# Patient Record
Sex: Male | Born: 1970 | Race: Black or African American | Hispanic: No | Marital: Single | Smoking: Never smoker
Health system: Southern US, Community
[De-identification: ages and names within clinical notes are randomized; demographics above are authoritative.]

## PROBLEM LIST (undated history)

## (undated) DIAGNOSIS — M1990 Unspecified osteoarthritis, unspecified site: Secondary | ICD-10-CM

## (undated) DIAGNOSIS — I10 Essential (primary) hypertension: Secondary | ICD-10-CM

## (undated) DIAGNOSIS — F419 Anxiety disorder, unspecified: Secondary | ICD-10-CM

## (undated) DIAGNOSIS — H409 Unspecified glaucoma: Secondary | ICD-10-CM

## (undated) DIAGNOSIS — N529 Male erectile dysfunction, unspecified: Secondary | ICD-10-CM

## (undated) DIAGNOSIS — T7840XA Allergy, unspecified, initial encounter: Secondary | ICD-10-CM

## (undated) DIAGNOSIS — F325 Major depressive disorder, single episode, in full remission: Secondary | ICD-10-CM

## (undated) HISTORY — DX: Allergy, unspecified, initial encounter: T78.40XA

## (undated) HISTORY — PX: CIRCUMCISION: SUR203

## (undated) HISTORY — DX: Unspecified glaucoma: H40.9

## (undated) HISTORY — DX: Morbid (severe) obesity due to excess calories: E66.01

## (undated) HISTORY — DX: Essential (primary) hypertension: I10

## (undated) HISTORY — DX: Male erectile dysfunction, unspecified: N52.9

## (undated) HISTORY — DX: Anxiety disorder, unspecified: F41.9

## (undated) HISTORY — DX: Unspecified osteoarthritis, unspecified site: M19.90

## (undated) HISTORY — PX: HERNIA REPAIR: SHX51

## (undated) HISTORY — DX: Major depressive disorder, single episode, in full remission: F32.5

---

## 1999-08-16 HISTORY — PX: APPENDECTOMY: SHX54

## 2008-12-18 ENCOUNTER — Ambulatory Visit: Payer: Self-pay | Admitting: Family Medicine

## 2008-12-18 DIAGNOSIS — I10 Essential (primary) hypertension: Secondary | ICD-10-CM

## 2008-12-31 ENCOUNTER — Ambulatory Visit: Payer: Self-pay | Admitting: Family Medicine

## 2009-01-01 LAB — CONVERTED CEMR LAB
ALT: 18 units/L (ref 0–53)
AST: 24 units/L (ref 0–37)
CO2: 26 meq/L (ref 19–32)
Calcium: 9.7 mg/dL (ref 8.4–10.5)
Chloride: 102 meq/L (ref 96–112)
Cholesterol: 184 mg/dL (ref 0–200)
Creatinine, Ser: 1.37 mg/dL (ref 0.40–1.50)
Sodium: 139 meq/L (ref 135–145)
Testosterone: 479.56 ng/dL (ref 350–890)
Total Bilirubin: 0.6 mg/dL (ref 0.3–1.2)
Total CHOL/HDL Ratio: 3.9
Total Protein: 7.6 g/dL (ref 6.0–8.3)
VLDL: 23 mg/dL (ref 0–40)

## 2009-03-17 ENCOUNTER — Encounter: Payer: Self-pay | Admitting: Family Medicine

## 2010-03-24 ENCOUNTER — Ambulatory Visit: Payer: Self-pay | Admitting: Family Medicine

## 2010-06-14 ENCOUNTER — Ambulatory Visit: Payer: Self-pay | Admitting: Family Medicine

## 2010-06-15 LAB — CONVERTED CEMR LAB
ALT: 19 units/L (ref 0–53)
AST: 25 units/L (ref 0–37)
Albumin: 4.4 g/dL (ref 3.5–5.2)
Alkaline Phosphatase: 58 units/L (ref 39–117)
Glucose, Bld: 88 mg/dL (ref 70–99)
LDL Cholesterol: 86 mg/dL (ref 0–99)
Potassium: 4.1 meq/L (ref 3.5–5.3)
Sodium: 139 meq/L (ref 135–145)
Total Bilirubin: 0.5 mg/dL (ref 0.3–1.2)
Total Protein: 7.4 g/dL (ref 6.0–8.3)
VLDL: 23 mg/dL (ref 0–40)

## 2010-09-14 NOTE — Assessment & Plan Note (Signed)
Summary: f/u BP   Vital Signs:  Patient profile:   40 year old male Height:      72.6 inches Weight:      243 pounds BMI:     32.53 O2 Sat:      100 % on Room air Pulse rate:   63 / minute BP sitting:   124 / 82  (left arm) Cuff size:   large  Vitals Entered By: Payton Spark CMA (June 14, 2010 8:25 AM)  O2 Flow:  Room air CC: F/U. Fasting labs., Hypertension Management   CC:  F/U. Fasting labs. and Hypertension Management.  Hypertension History:      He denies headache, chest pain, palpitations, dyspnea with exertion, peripheral edema, visual symptoms, and side effects from treatment.  He notes no problems with any antihypertensive medication side effects.        Positive major cardiovascular risk factors include hypertension.  Negative major cardiovascular risk factors include male age less than 41 years old, no history of diabetes or hyperlipidemia, negative family history for ischemic heart disease, and non-tobacco-user status.        Further assessment for target organ damage reveals no history of ASHD, cardiac end-organ damage (CHF/LVH), stroke/TIA, peripheral vascular disease, renal insufficiency, or hypertensive retinopathy.     Habits & Providers  Alcohol-Tobacco-Diet     Tobacco Status: never  Current Medications (verified): 1)  Viagra 50 Mg Tabs (Sildenafil Citrate) .Marland Kitchen.. 1 By Mouth As Needed 2)  Benicar Hct 20-12.5 Mg Tabs (Olmesartan Medoxomil-Hctz) .Marland Kitchen.. 1 Tab By Mouth Daily  Allergies (verified): No Known Drug Allergies  Social History: Smoking Status:  never  Review of Systems      See HPI  Physical Exam  General:  alert, well-developed, well-nourished, and well-hydrated.   Head:  normocephalic and atraumatic.   Eyes:  pupils equal, pupils round, and pupils reactive to light.   Mouth:  good dentition and pharynx pink and moist.   Neck:  no masses.   Lungs:  Normal respiratory effort, chest expands symmetrically. Lungs are clear to auscultation,  no crackles or wheezes. Heart:  Normal rate and regular rhythm. S1 and S2 normal without gallop, murmur, click, rub or other extra sounds. Extremities:  no LE edema Skin:  color normal.   Psych:  good eye contact, not anxious appearing, and not depressed appearing.     Impression & Recommendations:  Problem # 1:  ESSENTIAL HYPERTENSION, BENIGN (ICD-401.1) BP looks good but Benicar costing more than he'd like.  Will change to Losartan/ HCTZ daily.  Update fasting labs today. Schedule CPE in 6 mos. His updated medication list for this problem includes:    Losartan Potassium-hctz 100-12.5 Mg Tabs (Losartan potassium-hctz) .Marland Kitchen... 1 tab by mouth q am  Orders: T-Comprehensive Metabolic Panel ([id number])  BP today: 124/82 Prior BP: 114/72 (03/24/2010)  10 Yr Risk Heart Disease: 4 %  Labs Reviewed: K+: 4.5 (12/31/2008) Creat: : 1.37 (12/31/2008)   Chol: 184 (12/31/2008)   HDL: 47 (12/31/2008)   LDL: 114 (12/31/2008)   TG: 113 (12/31/2008)  Complete Medication List: 1)  Viagra 50 Mg Tabs (Sildenafil citrate) .Marland Kitchen.. 1 by mouth as needed 2)  Losartan Potassium-hctz 100-12.5 Mg Tabs (Losartan potassium-hctz) .Marland Kitchen.. 1 tab by mouth q am  Other Orders: T-Lipid Profile ([id number])  Hypertension Assessment/Plan:      The patient's hypertensive risk group is category A: No risk factors and no target organ damage.  His calculated 10 year risk of coronary heart disease is 4 %.  Today's blood pressure is 124/82.  His blood pressure goal is < 140/90.  Patient Instructions: 1)  Labs today. 2)  Will call you w/ results tomorrow. 3)  Change Benicar/ HCTZ to Losartan / HCTZ for BP. 4)  Return for a PHYSICAL in 6 mos. Prescriptions: LOSARTAN POTASSIUM-HCTZ 100-12.5 MG TABS (LOSARTAN POTASSIUM-HCTZ) 1 tab by mouth q AM  #30 x 6   Entered and Authorized by:   Seymour Bars DO   Signed by:   Seymour Bars DO on 06/14/2010   Method used:   [facility] to        [facility] [phone]* (retail)        [address]       [place], [place]  [address]       Ph: [phone] or [phone]       Fax: [phone]   RxID:   [id number]    Orders Added: 1)  T-Lipid Profile [phone] 2)  T-Comprehensive Metabolic Panel [[id number]] 3)  Est. Patient Level III [60109]

## 2010-09-14 NOTE — Assessment & Plan Note (Signed)
Summary: cough   Vital Signs:  Patient profile:   40 year old male Height:      72.6 inches Weight:      238 pounds BMI:     31.86 O2 Sat:      99 % on Room air Temp:     98.7 degrees F oral Pulse rate:   86 / minute BP sitting:   114 / 72  (left arm) Cuff size:   large  Vitals Entered By: Payton Spark CMA (March 24, 2010 10:51 AM)  O2 Flow:  Room air CC: Cold Sxs 1 month ago all Sxs except for dry cough have resolved.    Primary Care Provider:  Seymour Bars DO  CC:  Cold Sxs 1 month ago all Sxs except for dry cough have resolved. Marland Kitchen  History of Present Illness: 40 yo AAM presents for a dry cough x 1 month following a URI.  Denies fevers or chills.  Has had a sore throat every AM.  Has a tickle in his throat.  Has alllergies but is not taking anything.  The cough is not keeping him up at night.  He is taking Delsym at night.  Denies chest tightness or SOB.  He is not a smoker and has no hx of asthma.  The cough is non productive.    Denies symptoms of acid reflux. He is not on an ACEi.  Current Medications (verified): 1)  Viagra 50 Mg Tabs (Sildenafil Citrate) .Marland Kitchen.. 1 By Mouth As Needed 2)  Benicar Hct 20-12.5 Mg Tabs (Olmesartan Medoxomil-Hctz) .Marland Kitchen.. 1 Tab By Mouth Daily  Allergies (verified): No Known Drug Allergies  Review of Systems      See HPI  Physical Exam  General:  alert, well-developed, well-nourished, and well-hydrated.   Head:  normocephalic and atraumatic.  sinuses NTTP Eyes:  conjunctiva clear Ears:  EACs patent; TMs translucent and gray with good cone of light and bony landmarks.  Nose:  boggy turbinates with scant rhinorrhea Mouth:  postnasal drip present Neck:  no masses.   Lungs:  Normal respiratory effort, chest expands symmetrically. Lungs are clear to auscultation, no crackles or wheezes. Heart:  Normal rate and regular rhythm. S1 and S2 normal without gallop, murmur, click, rub or other extra sounds. Skin:  color normal.   Cervical Nodes:   No lymphadenopathy noted   Impression & Recommendations:  Problem # 1:  BRONCHITIS, VIRAL, ACUTE (ICD-466.0) Post viral URI cough.  Treat with Prednisone 40 mg/ day x 5 days + use of Delsym as needed. Call if cough has not resolved in 1 wk.  Complete Medication List: 1)  Viagra 50 Mg Tabs (Sildenafil citrate) .Marland Kitchen.. 1 by mouth as needed 2)  Benicar Hct 20-12.5 Mg Tabs (Olmesartan medoxomil-hctz) .Marland Kitchen.. 1 tab by mouth daily 3)  Prednisone 20 Mg Tabs (Prednisone) .... 2 tabs by mouth once daily x 5 days  Patient Instructions: 1)  Take Prednisone 40 mg once daily for the next 5 days for post viral cough. 2)  Stay on zyrtec OTC each night for allergies for the next 6 wks. 3)  Return for a PHYSICAL with fasting labs in 2 mos. Prescriptions: PREDNISONE 20 MG TABS (PREDNISONE) 2 tabs by mouth once daily x 5 days  #10 x 0   Entered and Authorized by:   Seymour Bars DO   Signed by:   Seymour Bars DO on 03/24/2010   Method used:   [facility]        [facility]  [address] [phone]* (retail)       [address] [place], [place]  [address]       Ph: [phone] or [phone]       Fax: [phone]   RxID:   [id number]

## 2010-11-18 ENCOUNTER — Other Ambulatory Visit: Payer: Self-pay | Admitting: Family Medicine

## 2010-11-29 ENCOUNTER — Other Ambulatory Visit: Payer: Self-pay | Admitting: *Deleted

## 2010-11-29 MED ORDER — SILDENAFIL CITRATE 50 MG PO TABS: 50.0000 mg | ORAL_TABLET | ORAL | Status: DC | PRN

## 2010-11-29 MED ORDER — LOSARTAN POTASSIUM-HCTZ 100-12.5 MG PO TABS: 1.0000 | ORAL_TABLET | Freq: Every day | ORAL | Status: DC

## 2010-12-14 ENCOUNTER — Telehealth: Payer: Self-pay | Admitting: Family Medicine

## 2010-12-14 NOTE — Telephone Encounter (Signed)
Pt called and said moving to [facility] and he was suppose to receive 90 day refills and according to system it was sent as 90 day refills.  Only showing 2 medications on file and they were sent.  LMOM for pt stating sent as 90 day and not 30 day. Jarvis Newcomer, LPN [facility]

## 2011-03-23 ENCOUNTER — Emergency Department (HOSPITAL_COMMUNITY): Payer: Self-pay

## 2011-03-23 ENCOUNTER — Emergency Department (HOSPITAL_COMMUNITY)
Admission: EM | Admit: 2011-03-23 | Discharge: 2011-03-23 | Disposition: A | Discharge status: Home / Self Care | Payer: Self-pay | Attending: Emergency Medicine | Admitting: Emergency Medicine

## 2011-03-23 DIAGNOSIS — I10 Essential (primary) hypertension: Secondary | ICD-10-CM | POA: Insufficient documentation

## 2011-03-23 DIAGNOSIS — A879 Viral meningitis, unspecified: Secondary | ICD-10-CM | POA: Insufficient documentation

## 2011-03-23 DIAGNOSIS — R509 Fever, unspecified: Secondary | ICD-10-CM | POA: Insufficient documentation

## 2011-03-23 DIAGNOSIS — R51 Headache: Secondary | ICD-10-CM | POA: Insufficient documentation

## 2011-03-23 DIAGNOSIS — M2569 Stiffness of other specified joint, not elsewhere classified: Secondary | ICD-10-CM | POA: Insufficient documentation

## 2011-03-23 DIAGNOSIS — Z79899 Other long term (current) drug therapy: Secondary | ICD-10-CM | POA: Insufficient documentation

## 2011-03-23 DIAGNOSIS — R291 Meningismus: Secondary | ICD-10-CM | POA: Insufficient documentation

## 2011-03-23 LAB — DIFFERENTIAL
Lymphs Abs: 1.9 10*3/uL (ref 0.7–4.0)
Monocytes Absolute: 0.5 10*3/uL (ref 0.1–1.0)
Monocytes Relative: 4 % (ref 3–12)
Neutro Abs: 9.9 10*3/uL — ABNORMAL HIGH (ref 1.7–7.7)
Neutrophils Relative %: 80 % — ABNORMAL HIGH (ref 43–77)

## 2011-03-23 LAB — CBC
Hemoglobin: 16.1 g/dL (ref 13.0–17.0)
MCH: 31.2 pg (ref 26.0–34.0)
MCV: 86.4 fL (ref 78.0–100.0)
RBC: 5.16 MIL/uL (ref 4.22–5.81)

## 2011-03-23 LAB — URINALYSIS, ROUTINE W REFLEX MICROSCOPIC
Bilirubin Urine: NEGATIVE
Leukocytes, UA: NEGATIVE
Nitrite: NEGATIVE
Specific Gravity, Urine: 1.02 (ref 1.005–1.030)
Urobilinogen, UA: 0.2 mg/dL (ref 0.0–1.0)
pH: 6 (ref 5.0–8.0)

## 2011-03-23 LAB — CSF CELL COUNT WITH DIFFERENTIAL
Lymphs, CSF: 74 % (ref 40–80)
Monocyte-Macrophage-Spinal Fluid: 20 % (ref 15–45)
RBC Count, CSF: 173 /mm3 — ABNORMAL HIGH
Segmented Neutrophils-CSF: 6 % (ref 0–6)
WBC, CSF: 94 /mm3 (ref 0–5)

## 2011-03-23 LAB — BASIC METABOLIC PANEL
BUN: 19 mg/dL (ref 6–23)
CO2: 27 mEq/L (ref 19–32)
Calcium: 9.7 mg/dL (ref 8.4–10.5)
Creatinine, Ser: 1.26 mg/dL (ref 0.50–1.35)
Glucose, Bld: 99 mg/dL (ref 70–99)

## 2011-03-23 LAB — GRAM STAIN

## 2011-03-27 LAB — CSF CULTURE W GRAM STAIN: Culture: NO GROWTH

## 2011-03-29 LAB — CULTURE, BLOOD (ROUTINE X 2): Culture  Setup Time: 201208082224

## 2011-06-29 ENCOUNTER — Other Ambulatory Visit: Payer: Self-pay | Admitting: *Deleted

## 2011-06-29 MED ORDER — LOSARTAN POTASSIUM-HCTZ 100-12.5 MG PO TABS: 1.0000 | ORAL_TABLET | Freq: Every day | ORAL | Status: DC

## 2011-07-18 ENCOUNTER — Encounter: Payer: Self-pay | Admitting: Family Medicine

## 2011-07-19 ENCOUNTER — Encounter: Payer: Self-pay | Admitting: Family Medicine

## 2011-07-19 ENCOUNTER — Ambulatory Visit (INDEPENDENT_AMBULATORY_CARE_PROVIDER_SITE_OTHER): Payer: 59 | Admitting: Family Medicine

## 2011-07-19 DIAGNOSIS — A879 Viral meningitis, unspecified: Secondary | ICD-10-CM

## 2011-07-19 DIAGNOSIS — Z9229 Personal history of other drug therapy: Secondary | ICD-10-CM

## 2011-07-19 DIAGNOSIS — N529 Male erectile dysfunction, unspecified: Secondary | ICD-10-CM

## 2011-07-19 DIAGNOSIS — Z0000 Encounter for general adult medical examination without abnormal findings: Secondary | ICD-10-CM

## 2011-07-19 DIAGNOSIS — I10 Essential (primary) hypertension: Secondary | ICD-10-CM

## 2011-07-19 MED ORDER — TADALAFIL 20 MG PO TABS: 20.0000 mg | ORAL_TABLET | Freq: Every day | ORAL | Status: DC | PRN

## 2011-07-19 MED ORDER — IRBESARTAN-HYDROCHLOROTHIAZIDE 150-12.5 MG PO TABS: 1.0000 | ORAL_TABLET | Freq: Every day | ORAL | Status: DC

## 2011-07-19 NOTE — Progress Notes (Signed)
  Subjective:    Patient ID: Albert Hogan, male    DOB: Nov 07, 1970, 40 y.o.   MRN: [id number]  Hypertension    #1Patient's here for followup of hypertension. He reports being out of his blood pressure medicine Hyzaar for about a week to 2 weeks. 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   BP 152/97  Pulse 88  Ht 6\' 1"  (1.854 m)  Wt 243 lb (110.224 kg)  BMI 32.06 kg/m2  SpO2 98%      Objective:   Physical Exam  Constitutional: He is oriented to person, place, and time. He appears well-developed and well-nourished.  HENT:  Head: Normocephalic and atraumatic.  Neck: Normal range of motion. Neck supple. No tracheal deviation present. No thyromegaly present.  Cardiovascular: Normal rate, regular rhythm and normal heart sounds.   Pulmonary/Chest: Effort normal. No respiratory distress. He has no wheezes.  Neurological: He is alert and oriented to person, place, and time. No cranial nerve deficit. Coordination normal.  Skin: Skin is warm and dry.  Psychiatric: He has a normal mood and affect. His behavior is normal.       Assessment & Plan:  #1 hypertension since she has been on Benicar before and he states the Diovan causing her headache we'll try him on Avalide 150/12.5 to see if this makes a difference as blood pressure #2 social dysfunction. Viagra, Cialis 20 mg #10 one by mouth before sex relations talk about the pros and cons of Cialis versus Viagra also offered him the daily Cialis but that does not appear to be a good match at this time Will check testosterone level as well.. #3 history of second episode of viral meningitis he is recovered but because of his history of 2 episodes about meningitis we HIV and CBC to evaluate immune system  #4 immunization update also carried out  and patient has had his immunization updated

## 2011-07-19 NOTE — Patient Instructions (Signed)
Immune System The immune system is the body's natural defense system. It consists of organs and highly specialized cells, all working together to clear infection from the body. To accomplish this, the immune system relies on natural barriers, such as skin and mucus, to help block infection. It also relies on white blood cells and antibodies. Antibodies (also called immunoglobulin, or Ig) are large, Y-shaped proteins used to identify and remove germs, such as bacteria and viruses. HOW THE IMMUNE SYSTEM WORKS White blood cells travel throughout the body and are made in the bone marrow. Bone marrow is found in the center shafts of certain long, flat bones of the body. There are many types of white blood cells with different roles in fighting infection. These cells are grouped into 3 categories: lymphocytes, granulocytes, and monocytes/macrophages. Lymphocytes The 2 major classes of lymphocytes are B cells and T cells.  B cells produce antibodies that circulate in the blood and lymph streams. These antibodies attach to foreign germs to mark them for destruction by other immune cells. The types of antibodies made by B cells are:   IgG, the main infection-fighting antibody in the bloodstream.   IgA, which helps protect the gut and respiratory tract.   IgM, made at the early signs of infection.   IgD, the purpose of which is unknown.   IgE, which helps fight against parasites, but can also be elevated with allergies and other conditions.   T cells are responsible for cellular immunity. T cells tell other white blood cells to prepare to fight. T cells provide support to other cells to help them fight and kill infected cells in the body. There are 2 main classes of T cells:   Helper T cells (CD4-positive T cells). They alert B cells to start making antibodies. They activate other T cells and macrophages. They influence which type of antibody is produced.   Cytotoxic T cells (CD8-positive T cells). They  become killer cells that attack and destroy infected cells.  Granulocytes  These cells are attracted to sites of inflammation, injury, or infection.   They release chemicals to kill germs or clean up wounds.   They are the "front-line" fighters against infection.   They have a short life span in the bloodstream. New ones are constantly being made.  Monocytes/Macrophages Monocytes travel in the blood and mature to become macrophages that travel in the tissues. These cells are very important in alerting the immune system about an infection. Macrophages are scavengers whose job is to engulf or eat up infecting germs and even infected cells. IMMUNE SYSTEM PROBLEMS People who have problems with the number of white cells being made and how well they do their jobs can be more prone to certain germs. This can be the cause of a decreased ability to fight infection (immunodeficiency). Problems in the immune system range from mild to severe. Your caregiver can help you determine if your immune system is working properly. Document Released: 08/04/2003 Document Revised: 04/13/2011 Document Reviewed: 12/07/2009 [facility] Patient Information 2012 [place], [place].Hypertension As your heart beats, it forces blood through your arteries. This force is your blood pressure. If the pressure is too high, it is called hypertension (HTN) or high blood pressure. HTN is dangerous because you may have it and not know it. High blood pressure may mean that your heart has to work harder to pump blood. Your arteries may be narrow or stiff. The extra work puts you at risk for heart disease, stroke, and other problems.  Blood pressure consists of two numbers, a higher number over a lower, 110/72, for example. It is stated as "110 over 72." The ideal is below 120 for the top number (systolic) and under 80 for the bottom (diastolic). Write down your blood pressure today. You should pay close attention to your blood pressure if you  have certain conditions such as:  Heart failure.   Prior heart attack.   Diabetes   Chronic kidney disease.   Prior stroke.   Multiple risk factors for heart disease.  To see if you have HTN, your blood pressure should be measured while you are seated with your arm held at the level of the heart. It should be measured at least twice. A one-time elevated blood pressure reading (especially in the Emergency Department) does not mean that you need treatment. There may be conditions in which the blood pressure is different between your right and left arms. It is important to see your caregiver soon for a recheck. Most people have essential hypertension which means that there is not a specific cause. This type of high blood pressure may be lowered by changing lifestyle factors such as:  Stress.   Smoking.   Lack of exercise.   Excessive weight.   Drug/tobacco/alcohol use.   Eating less salt.  Most people do not have symptoms from high blood pressure until it has caused damage to the body. Effective treatment can often prevent, delay or reduce that damage. TREATMENT  When a cause has been identified, treatment for high blood pressure is directed at the cause. There are a large number of medications to treat HTN. These fall into several categories, and your caregiver will help you select the medicines that are best for you. Medications may have side effects. You should review side effects with your caregiver. If your blood pressure stays high after you have made lifestyle changes or started on medicines,   Your medication(s) may need to be changed.   Other problems may need to be addressed.   Be certain you understand your prescriptions, and know how and when to take your medicine.   Be sure to follow up with your caregiver within the time frame advised (usually within two weeks) to have your blood pressure rechecked and to review your medications.   If you are taking more than one  medicine to lower your blood pressure, make sure you know how and at what times they should be taken. Taking two medicines at the same time can result in blood pressure that is too low.  SEEK IMMEDIATE MEDICAL CARE IF:  You develop a severe headache, blurred or changing vision, or confusion.   You have unusual weakness or numbness, or a faint feeling.   You have severe chest or abdominal pain, vomiting, or breathing problems.  MAKE SURE YOU:   Understand these instructions.   Will watch your condition.   Will get help right away if you are not doing well or get worse.  Document Released: 08/01/2005 Document Revised: 04/13/2011 Document Reviewed: 03/21/2008 [facility] Patient Information 2012 [place], [place].

## 2011-07-20 LAB — BASIC METABOLIC PANEL WITH GFR
CO2: 28 mEq/L (ref 19–32)
Chloride: 102 mEq/L (ref 96–112)
Creat: 1.26 mg/dL (ref 0.50–1.35)
Sodium: 139 mEq/L (ref 135–145)

## 2011-07-20 LAB — CBC WITH DIFFERENTIAL/PLATELET
Eosinophils Relative: 3 % (ref 0–5)
Lymphocytes Relative: 31 % (ref 12–46)
Lymphs Abs: 2 10*3/uL (ref 0.7–4.0)
MCV: 87.5 fL (ref 78.0–100.0)
Platelets: 193 10*3/uL (ref 150–400)
RBC: 4.96 MIL/uL (ref 4.22–5.81)
WBC: 6.4 10*3/uL (ref 4.0–10.5)

## 2011-07-20 LAB — HIV ANTIBODY (ROUTINE TESTING W REFLEX): HIV: NONREACTIVE

## 2011-08-11 ENCOUNTER — Telehealth: Payer: Self-pay | Admitting: Family Medicine

## 2011-08-11 NOTE — Progress Notes (Signed)
Testosterone not one will discuss w/patient on return.

## 2011-08-15 NOTE — Telephone Encounter (Signed)
Testosterone  will discuss w/patient on return.

## 2011-08-19 ENCOUNTER — Ambulatory Visit (INDEPENDENT_AMBULATORY_CARE_PROVIDER_SITE_OTHER): Payer: 59 | Admitting: Physician Assistant

## 2011-08-19 ENCOUNTER — Encounter: Payer: Self-pay | Admitting: Physician Assistant

## 2011-08-19 VITALS — BP 136/89 | HR 83 | Temp 98.3°F | Wt 246.0 lb

## 2011-08-19 DIAGNOSIS — I10 Essential (primary) hypertension: Secondary | ICD-10-CM

## 2011-08-19 DIAGNOSIS — J069 Acute upper respiratory infection, unspecified: Secondary | ICD-10-CM

## 2011-08-19 MED ORDER — FLUTICASONE PROPIONATE 50 MCG/ACT NA SUSP: 2.0000 | Freq: Every day | NASAL | Status: DC

## 2011-08-19 NOTE — Progress Notes (Signed)
  Subjective:    Patient ID: Albert Hogan, male    DOB: [birth year], 41 y.o.   MRN: [id number]  URI  This is a new problem. The current episode started in the past 7 days. The problem has been gradually worsening. The maximum temperature recorded prior to his arrival was 101 - 101.9 F. Associated symptoms include sinus pain. Pertinent negatives include no abdominal pain, chest pain, diarrhea, joint pain, nausea, plugged ear sensation, rash, vomiting or wheezing. He has tried acetaminophen for the symptoms. The treatment provided mild relief.   Symptoms for 2 days. Denies SOB or wheezing.   Review of Systems  Respiratory: Negative for wheezing.   Cardiovascular: Negative for chest pain.  Gastrointestinal: Negative for nausea, vomiting, abdominal pain and diarrhea.  Musculoskeletal: Negative for joint pain.  Skin: Negative for rash.       Objective:   Physical Exam  Constitutional: He is oriented to person, place, and time. He appears well-developed and well-nourished. No distress.  HENT:  Head: Normocephalic and atraumatic.  Right Ear: External ear normal.  Left Ear: External ear normal.  Mouth/Throat: Oropharynx is clear and moist. No oropharyngeal exudate.       Bilateral turbinates swollen and erythematous.  Eyes: Right eye exhibits no discharge. Left eye exhibits no discharge.  Neck:       Left anterior cervical nodes enlarged without tenderness.  Cardiovascular: Normal rate, regular rhythm and normal heart sounds.   Pulmonary/Chest: Effort normal and breath sounds normal. No respiratory distress. He has no wheezes. He has no rales. He exhibits no tenderness.  Lymphadenopathy:    He has cervical adenopathy.  Neurological: He is alert and oriented to person, place, and time.  Skin: Skin is warm and dry. He is not diaphoretic.  Psychiatric: He has a normal mood and affect. His behavior is normal.          Assessment & Plan:   URI- Gave Flonase to use daily for nasal  congestion. Instructed to start regular mucinex BID, sinus rinses daily, tylenol for headache, Vitamin C daily, and honey for cough at night. If not better by next Friday call office and I will prescribe you an antibiotic.    Hypertension- Patient has not taken blood pressure medication this morning. Instructed to stay away from decongestants and use regular mucinex due to blood pressure issues.

## 2011-08-19 NOTE — Patient Instructions (Addendum)
Start Mucinex twice a day. Make sure you stay hydrated. Can use sinus rinses along with Vitamin C daily. Use tylenol as needed for aches and pain. Flonase to use daily for nasal congestion.Marland Kitchen Honey at night for cough. Call if not better by next Friday 7 days and we will call in antibiotic.

## 2011-10-27 ENCOUNTER — Other Ambulatory Visit: Payer: Self-pay | Admitting: *Deleted

## 2011-10-27 DIAGNOSIS — N529 Male erectile dysfunction, unspecified: Secondary | ICD-10-CM

## 2011-10-27 MED ORDER — TADALAFIL 20 MG PO TABS: 20.0000 mg | ORAL_TABLET | Freq: Every day | ORAL | Status: DC | PRN

## 2011-11-09 ENCOUNTER — Other Ambulatory Visit: Payer: Self-pay | Admitting: *Deleted

## 2011-11-09 DIAGNOSIS — I10 Essential (primary) hypertension: Secondary | ICD-10-CM

## 2011-11-09 MED ORDER — LOSARTAN POTASSIUM-HCTZ 100-12.5 MG PO TABS: 1.0000 | ORAL_TABLET | Freq: Every day | ORAL | Status: DC

## 2011-11-09 MED ORDER — IRBESARTAN-HYDROCHLOROTHIAZIDE 150-12.5 MG PO TABS: 1.0000 | ORAL_TABLET | Freq: Every day | ORAL | Status: DC

## 2011-11-15 ENCOUNTER — Encounter: Payer: Self-pay | Admitting: Family Medicine

## 2011-11-15 ENCOUNTER — Ambulatory Visit (INDEPENDENT_AMBULATORY_CARE_PROVIDER_SITE_OTHER): Payer: 59 | Admitting: Family Medicine

## 2011-11-15 VITALS — BP 132/90 | HR 81 | Ht 73.0 in | Wt 240.0 lb

## 2011-11-15 DIAGNOSIS — I10 Essential (primary) hypertension: Secondary | ICD-10-CM

## 2011-11-15 DIAGNOSIS — N529 Male erectile dysfunction, unspecified: Secondary | ICD-10-CM

## 2011-11-15 MED ORDER — IRBESARTAN 300 MG PO TABS: 300.0000 mg | ORAL_TABLET | Freq: Every day | ORAL | Status: DC

## 2011-11-15 MED ORDER — HYDROCHLOROTHIAZIDE 12.5 MG PO CAPS: 12.5000 mg | ORAL_CAPSULE | Freq: Every day | ORAL | Status: DC

## 2011-11-15 MED ORDER — TADALAFIL 20 MG PO TABS: 20.0000 mg | ORAL_TABLET | Freq: Every day | ORAL | Status: DC | PRN

## 2011-11-15 NOTE — Progress Notes (Signed)
  Subjective:    Patient ID: Albert Hogan, male    DOB: 12/11/1970, 41 y.o.   MRN: [id number]  Hypertension This is a recurrent problem. The current episode started more than 1 year ago. The problem has been gradually improving since onset. Pertinent negatives include no anxiety, blurred vision, chest pain, headaches, malaise/fatigue, neck pain, orthopnea, palpitations, peripheral edema, PND, shortness of breath or sweats. There are no associated agents to hypertension. Risk factors for coronary artery disease include no known risk factors. Past treatments include diuretics and angiotensin blockers. The current treatment provides significant improvement. There are no compliance problems.  Hypertensive end-organ damage includes retinopathy. There is no history of angina, kidney disease, CAD/MI, CVA, heart failure or left ventricular hypertrophy.   Patient is well pleased with Avapro and HCTZ. Unfortunately unable to get both medications as one agent.   Review of Systems  Constitutional: Negative for malaise/fatigue.  HENT: Negative for neck pain.   Eyes: Negative for blurred vision.  Respiratory: Negative for shortness of breath.   Cardiovascular: Negative for chest pain, palpitations, orthopnea and PND.  Neurological: Negative for headaches.  All other systems reviewed and are negative.      BP 132/90  Pulse 81  Ht 6\' 1"  (1.854 m)  Wt 240 lb (108.863 kg)  BMI 31.66 kg/m2  SpO2 100% Objective:   Physical Exam  Constitutional: He is oriented to person, place, and time. He appears well-developed and well-nourished.  HENT:  Head: Normocephalic and atraumatic.  Eyes: Pupils are equal, round, and reactive to light.  Neck: Normal range of motion. Neck supple.  Cardiovascular: Normal rate and regular rhythm.   Pulmonary/Chest: Effort normal and breath sounds normal.  Neurological: He is alert and oriented to person, place, and time.  Skin: Skin is warm and dry.  Psychiatric: He has a  normal mood and affect. His behavior is normal.          Assessment & Plan:  #1 hypertension. Blood pressure is better control but not at goal yet. Will increase Avapro 150-1-1/2 tablet a day and in 2 weeks or after his current prescription is through increase Avapro to 300 mg a day to keep HCTZ at 12.5 mg a day. If patient does not tolerate this may increase HCTZ to 25 mg a day. Return in 4 months for followup since we are making changes. #2 erectile dysfunction. Cialis 20 mg appears to be working well we'll maintain him on the 20 mg. A year prescription given to patient.

## 2011-11-15 NOTE — Patient Instructions (Signed)

## 2011-12-21 ENCOUNTER — Telehealth: Payer: Self-pay | Admitting: *Deleted

## 2011-12-21 DIAGNOSIS — Z889 Allergy status to unspecified drugs, medicaments and biological substances status: Secondary | ICD-10-CM

## 2011-12-21 NOTE — Telephone Encounter (Signed)
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 Please advise. I have added this to his allergies.

## 2011-12-22 MED ORDER — EPINEPHRINE 0.3 MG/0.3ML IJ DEVI: 0.3000 mg | Freq: Once | INTRAMUSCULAR | Status: DC

## 2011-12-22 NOTE — Telephone Encounter (Signed)
That's fine.W/2 refills.

## 2011-12-22 NOTE — Telephone Encounter (Signed)
Pt informed

## 2012-01-13 ENCOUNTER — Other Ambulatory Visit: Payer: Self-pay | Admitting: *Deleted

## 2012-01-13 MED ORDER — HYDROCHLOROTHIAZIDE 12.5 MG PO CAPS: 12.5000 mg | ORAL_CAPSULE | Freq: Every day | ORAL | Status: DC

## 2012-01-13 MED ORDER — EPINEPHRINE 0.3 MG/0.3ML IJ DEVI: 0.3000 mg | Freq: Once | INTRAMUSCULAR | Status: DC

## 2012-01-16 ENCOUNTER — Encounter: Payer: Self-pay | Admitting: Physician Assistant

## 2012-01-16 ENCOUNTER — Ambulatory Visit (INDEPENDENT_AMBULATORY_CARE_PROVIDER_SITE_OTHER): Payer: 59 | Admitting: Physician Assistant

## 2012-01-16 VITALS — BP 137/94 | HR 90 | Ht 73.0 in | Wt 241.0 lb

## 2012-01-16 DIAGNOSIS — S239XXA Sprain of unspecified parts of thorax, initial encounter: Secondary | ICD-10-CM

## 2012-01-16 DIAGNOSIS — M62830 Muscle spasm of back: Secondary | ICD-10-CM

## 2012-01-16 DIAGNOSIS — M5380 Other specified dorsopathies, site unspecified: Secondary | ICD-10-CM

## 2012-01-16 MED ORDER — CYCLOBENZAPRINE HCL 10 MG PO TABS: 10.0000 mg | ORAL_TABLET | Freq: Three times a day (TID) | ORAL | Status: AC | PRN

## 2012-01-16 NOTE — Progress Notes (Signed)
  Subjective:    Patient ID: Albert Hogan, male    DOB: 03-Jun-1971, 41 y.o.   MRN: [id number]  HPI Patient presents to the clinic due to back pain and spasms. 3 days ago patient was helping a friend move. He does not remember an injury but the next morning he woke up and his upper to mid back felt tight and as the day progressed spasmed. 2 days ago there was pain when he sat, stood, or laid down. He could not get comfortable. He has taken some ibuprofen but didn't noticed much relief. He feels like the tightness is getting worse. He denies any numbness or tingling of back, arms, or legs.     Review of Systems     Objective:   Physical Exam  Constitutional: He is oriented to person, place, and time. He appears well-developed and well-nourished.  HENT:  Head: Normocephalic and atraumatic.  Cardiovascular: Normal rate, regular rhythm and normal heart sounds.   Pulmonary/Chest:       Back hurt with deep breaths.   Musculoskeletal:       Arms:      Normal ROM at the waist with forward flexion, backward extension and side to side. Neck full ROM. NO bony tenderness. Paraspinous muscle were tight in thoracic region bilaterally.   Neurological: He is alert and oriented to person, place, and time.  Skin: Skin is warm and dry.  Psychiatric: He has a normal mood and affect. His behavior is normal.          Assessment & Plan:  Thoracic sprain/muscle spasm of the back- Ibuprofen 800mg  up to three times a day for next 3-4 days then take as needed. Hot shower then apply ice to affected area. Drink a lot of water to keep muscle hydrated. Flexeril 10mg  was given to take at bedtime and if well tolerated throughout the day up to three times. Call if not improving in next 3-4 days.

## 2012-01-16 NOTE — Patient Instructions (Addendum)
Ibuprofen 800mg  up to three times a day for next 3-4 days then take as needed. Hot shower then apply ice to affected area. Drink a lot of water to keep muscle hydrated.   Thoracic Strain You have injured the muscles or tendons that attach to the upper part of your back behind your chest. This injury is called a thoracic strain, thoracic sprain, or mid-back strain.  CAUSES  The cause of thoracic strain varies. A less severe injury involves pulling a muscle or tendon without tearing it. A more severe injury involves tearing (rupturing) a muscle or tendon. With less severe injuries, there may be little loss of strength. Sometimes, there are breaks (fractures) in the bones to which the muscles are attached. These fractures are rare, unless there was a direct hit (trauma) or you have weak bones due to osteoporosis or age. Longstanding strains may be caused by overuse or improper form during certain movements. Obesity can also increase your risk for back injuries. Sudden strains may occur due to injury or not warming up properly before exercise. Often, there is no obvious cause for a thoracic strain. SYMPTOMS  The main symptom is pain, especially with movement, such as during exercise. DIAGNOSIS  Your caregiver can usually tell what is wrong by taking an X-ray and doing a physical exam. TREATMENT   Physical therapy may be helpful for recovery. Your caregiver can give you exercises to do or refer you to a physical therapist after your pain improves.   After your pain improves, strengthening and conditioning programs appropriate for your sport or occupation may be helpful.   Always warm up before physical activities or athletics. Stretching after physical activity may also help.   Certain over-the-counter medicines may also help. Ask your caregiver if there are medicines that would help you.  If this is your first thoracic strain injury, proper care and proper healing time before starting activities  should prevent long-term problems. Torn ligaments and tendons require as long to heal as broken bones. Average healing times may be only 1 week for a mild strain. For torn muscles and tendons, healing time may be up to 6 weeks to 2 months. HOME CARE INSTRUCTIONS   Apply ice to the injured area. Ice massages may also be used as directed.   Put ice in a plastic bag.   Place a towel between your skin and the bag.   Leave the ice on for 15 to 20 minutes, 3 to 4 times a day, for the first 2 days.   Only take over-the-counter or prescription medicines for pain, discomfort, or fever as directed by your caregiver.   Keep your appointments for physical therapy if this was prescribed.   Use wraps and back braces as instructed.  SEEK IMMEDIATE MEDICAL CARE IF:   You have an increase in bruising, swelling, or pain.   Your pain has not improved with medicines.   You develop new shortness of breath, chest pain, or fever.   Problems seem to be getting worse rather than better.  MAKE SURE YOU:   Understand these instructions.   Will watch your condition.   Will get help right away if you are not doing well or get worse.  Document Released: 10/22/2003 Document Revised: 07/21/2011 Document Reviewed: 09/17/2010 [facility] Patient Information 2012 [place], [place].

## 2012-03-20 ENCOUNTER — Ambulatory Visit (INDEPENDENT_AMBULATORY_CARE_PROVIDER_SITE_OTHER): Payer: 59 | Admitting: Family Medicine

## 2012-03-20 ENCOUNTER — Encounter: Payer: Self-pay | Admitting: Family Medicine

## 2012-03-20 VITALS — BP 126/84 | HR 80 | Ht 73.0 in | Wt 241.0 lb

## 2012-03-20 DIAGNOSIS — G5630 Lesion of radial nerve, unspecified upper limb: Secondary | ICD-10-CM

## 2012-03-20 DIAGNOSIS — I10 Essential (primary) hypertension: Secondary | ICD-10-CM

## 2012-03-20 DIAGNOSIS — N529 Male erectile dysfunction, unspecified: Secondary | ICD-10-CM

## 2012-03-20 MED ORDER — MELOXICAM 15 MG PO TABS: 15.0000 mg | ORAL_TABLET | Freq: Every day | ORAL | Status: DC

## 2012-03-20 MED ORDER — TADALAFIL 20 MG PO TABS: 20.0000 mg | ORAL_TABLET | Freq: Every day | ORAL | Status: DC | PRN

## 2012-03-20 MED ORDER — OLMESARTAN MEDOXOMIL-HCTZ 40-12.5 MG PO TABS: 1.0000 | ORAL_TABLET | Freq: Every day | ORAL | Status: DC

## 2012-03-20 NOTE — Patient Instructions (Addendum)
Body Mass Index (BMI) This BMI is not intended for use with those under 41 years of age, or pregnant women, or lactating women. To estimate BMI, locate your height, then find your weight in this listing. Your BMI is located to the right of your weight. Height: 58 inches  Weight: 91 lb = BMI 19 (normal)   Weight: 96 lb = BMI 20 (normal)   Weight: 100 lb = BMI 21 (normal)   Weight: 105 lb = BMI 22 (normal)   Weight: 110 lb = BMI 23 (normal)   Weight: 115 lb = BMI 24 (normal)   Weight: 119 lb = BMI 25 (overweight)   Weight: 124 lb = BMI 26 (overweight)   Weight: 129 lb = BMI 27 (overweight)   Weight: 134 lb = BMI 28 (overweight)   Weight: 138 lb = BMI 29 (overweight)   Weight: 143 lb = BMI 30 (obese)   Weight: 148 lb = BMI 31 (obese)   Weight: 153 lb = BMI 32 (obese)   Weight: 158 lb = BMI 33 (obese)   Weight: 162 lb = BMI 34 (obese)   Weight: 167 lb = BMI 35 (obese)   Weight: 172 lb = BMI 36 (obese)   Weight: 177 lb = BMI 37 (obese)   Weight: 181 lb = BMI 38 (obese)   Weight: 186 lb = BMI 39 (obese)   Weight: 191 lb = BMI 40 (extreme obesity)   Weight: 196 lb = BMI 41 (extreme obesity)   Weight: 201 lb = BMI 42 (extreme obesity)   Weight: 205 lb = BMI 43 (extreme obesity)   Weight: 210 lb = BMI 44 (extreme obesity)   Weight: 215 lb = BMI 45 (extreme obesity)   Weight: 220 lb = BMI 46 (extreme obesity)   Weight: 224 lb = BMI 47 (extreme obesity)   Weight: 229 lb = BMI 48 (extreme obesity)   Weight: 234 lb = BMI 49 (extreme obesity)   Weight: 239 lb = BMI 50 (extreme obesity)   Weight: 244 lb = BMI 51 (extreme obesity)   Weight: 248 lb = BMI 52 (extreme obesity)   Weight: 253 lb = BMI 53 (extreme obesity)   Weight: 258 lb = BMI 54 (extreme obesity)  Height: 59 inches  Weight: 94 lb = BMI 19 (normal)   Weight: 99 lb = BMI 20 (normal)   Weight: 104 lb = BMI 21 (normal)   Weight: 109 lb = BMI 22 (normal)   Weight: 114 lb = BMI 23 (normal)    Weight: 119 lb = BMI 24 (normal)   Weight: 124 lb = BMI 25 (overweight)   Weight: 128 lb = BMI 26 (overweight)   Weight: 133 lb = BMI 27 (overweight)   Weight: 138 lb = BMI 28 (overweight)   Weight: 143 lb = BMI 29 (overweight)   Weight: 148 lb = BMI 30 (obese)   Weight: 153 lb = BMI 31 (obese)   Weight: 158 lb = BMI 32 (obese)   Weight: 163 lb = BMI 33 (obese)   Weight: 168 lb = BMI 34 (obese)   Weight: 173 lb = BMI 35 (obese)   Weight: 178 lb = BMI 36 (obese)   Weight: 183 lb = BMI 37 (obese)   Weight: 188 lb = BMI 38 (obese)   Weight: 193 lb = BMI 39 (obese)   Weight: 198 lb = BMI 40 (extreme obesity)   Weight: 203 lb = BMI 41 (extreme  obesity)   Weight: 208 lb = BMI 42 (extreme obesity)   Weight: 212 lb = BMI 43 (extreme obesity)   Weight: 217 lb = BMI 44 (extreme obesity)   Weight: 222 lb = BMI 45 (extreme obesity)   Weight: 227 lb = BMI 46 (extreme obesity)   Weight: 232 lb = BMI 47 (extreme obesity)   Weight: 237 lb = BMI 48 (extreme obesity)   Weight: 242 lb = BMI 49 (extreme obesity)   Weight: 247 lb = BMI 50 (extreme obesity)   Weight: 252 lb = BMI 51 (extreme obesity)   Weight: 257 lb = BMI 52 (extreme obesity)   Weight: 262 lb = BMI 53 (extreme obesity)   Weight: 267 lb = BMI 54 (extreme obesity)  Height: 60 inches  Weight: 97 lb = BMI 19 (normal)   Weight: 102 lb = BMI 20 (normal)   Weight: 107 lb = BMI 21 (normal)   Weight: 112 lb = BMI 22 (normal)   Weight: 118 lb = BMI 23 (normal)   Weight: 123 lb = BMI 24 (normal)   Weight: 128 lb = BMI 25 (overweight)   Weight: 133 lb = BMI 26 (overweight)   Weight: 138 lb = BMI 27 (overweight)   Weight: 143 lb = BMI 28 (overweight)   Weight: 148 lb = BMI 29 (overweight)   Weight: 153 lb = BMI 30 (obese)   Weight: 158 lb = BMI 31 (obese)   Weight: 163 lb = BMI 32 (obese)   Weight: 168 lb = BMI 33 (obese)   Weight: 174 lb = BMI 34 (obese)   Weight: 179 lb = BMI 35  (obese)   Weight: 184 lb = BMI 36 (obese)   Weight: 189 lb = BMI 37 (obese)   Weight: 194 lb = BMI 38 (obese)   Weight: 199 lb = BMI 39 (obese)   Weight: 204 lb = BMI 40 (extreme obesity)   Weight: 209 lb = BMI 41 (extreme obesity)   Weight: 215 lb = BMI 42 (extreme obesity)   Weight: 220 lb = BMI 43 (extreme obesity)   Weight: 225 lb = BMI 44 (extreme obesity)   Weight: 230 lb = BMI 45 (extreme obesity)   Weight: 235 lb = BMI 46 (extreme obesity)   Weight: 240 lb = BMI 47 (extreme obesity)   Weight: 245 lb = BMI 48 (extreme obesity)   Weight: 250 lb = BMI 49 (extreme obesity)   Weight: 255 lb = BMI 50 (extreme obesity)   Weight: 261 lb = BMI 51 (extreme obesity)   Weight: 266 lb = BMI 52 (extreme obesity)   Weight: 271 lb = BMI 53 (extreme obesity)   Weight: 276 lb = BMI 54 (extreme obesity)  Height: 61 inches  Weight: 100 lb = BMI 19 (normal)   Weight: 106 lb = BMI 20 (normal)   Weight: 111 lb = BMI 21 (normal)   Weight: 116 lb = BMI 22 (normal)   Weight: 122 lb = BMI 23 (normal)   Weight: 127 lb = BMI 24 (normal)   Weight: 132 lb = BMI 25 (overweight)   Weight: 137 lb = BMI 26 (overweight)   Weight: 143 lb = BMI 27 (overweight)   Weight: 148 lb = BMI 28 (overweight)   Weight: 153 lb = BMI 29 (overweight)   Weight: 158 lb = BMI 30 (obese)   Weight: 164 lb = BMI 31 (obese)   Weight: 169 lb =  BMI 32 (obese)   Weight: 174 lb = BMI 33 (obese)   Weight: 180 lb = BMI 34 (obese)   Weight: 185 lb = BMI 35 (obese)   Weight: 190 lb = BMI 36 (obese)   Weight: 195 lb = BMI 37 (obese)   Weight: 201 lb = BMI 38 (obese)   Weight: 206 lb = BMI 39 (obese)   Weight: 211 lb = BMI 40 (extreme obesity)   Weight: 217 lb = BMI 41 (extreme obesity)   Weight: 222 lb = BMI 42 (extreme obesity)   Weight: 227 lb = BMI 43 (extreme obesity)   Weight: 232 lb = BMI 44 (extreme obesity)   Weight: 238 lb = BMI 45 (extreme obesity)   Weight: 243 lb = BMI  46 (extreme obesity)   Weight: 248 lb = BMI 47 (extreme obesity)   Weight: 254 lb = BMI 48 (extreme obesity)   Weight: 259 lb = BMI 49 (extreme obesity)   Weight: 264 lb = BMI 50 (extreme obesity)   Weight: 269 lb = BMI 51 (extreme obesity)   Weight: 275 lb = BMI 52 (extreme obesity)   Weight: 280 lb = BMI 53 (extreme obesity)   Weight: 285 lb = BMI 54 (extreme obesity)  Height: 62 inches  Weight: 104 lb = BMI 19 (normal)   Weight: 109 lb = BMI 20 (normal)   Weight: 115 lb = BMI 21 (normal)   Weight: 120 lb = BMI 22 (normal)   Weight: 126 lb = BMI 23 (normal)   Weight: 131 lb = BMI 24 (normal)   Weight: 136 lb = BMI 25 (overweight)   Weight: 142 lb = BMI 26 (overweight)   Weight: 147 lb = BMI 27 (overweight)   Weight: 153 lb = BMI 28 (overweight)   Weight: 158 lb = BMI 29 (overweight)   Weight: 164 lb = BMI 30 (obese)   Weight: 169 lb = BMI 31 (obese)   Weight: 175 lb = BMI 32 (obese)   Weight: 180 lb = BMI 33 (obese)   Weight: 186 lb = BMI 34 (obese)   Weight: 191 lb = BMI 35 (obese)   Weight: 196 lb = BMI 36 (obese)   Weight: 202 lb = BMI 37 (obese)   Weight: 207 lb = BMI 38 (obese)   Weight: 213 lb = BMI 39 (obese)   Weight: 218 lb = BMI 40 (extreme obesity)   Weight: 224 lb = BMI 41 (extreme obesity)   Weight: 229 lb = BMI 42 (extreme obesity)   Weight: 235 lb = BMI 43 (extreme obesity)   Weight: 240 lb = BMI 44 (extreme obesity)   Weight: 246 lb = BMI 45 (extreme obesity)   Weight: 251 lb = BMI 46 (extreme obesity)   Weight: 256 lb = BMI 47 (extreme obesity)   Weight: 262 lb = BMI 48 (extreme obesity)   Weight: 267 lb = BMI 49 (extreme obesity)   Weight: 273 lb = BMI 50 (extreme obesity)   Weight: 278 lb = BMI 51 (extreme obesity)   Weight: 284 lb = BMI 52 (extreme obesity)   Weight: 289 lb = BMI 53 (extreme obesity)   Weight: 295 lb = BMI 54 (extreme obesity)  Height: 63 inches  Weight: 107 lb = BMI 19 (normal)    Weight: 113 lb = BMI 20 (normal)   Weight: 118 lb = BMI 21 (normal)   Weight: 124 lb = BMI 22 (normal)  Weight: 130 lb = BMI 23 (normal)   Weight: 135 lb = BMI 24 (normal)   Weight: 141 lb = BMI 25 (overweight)   Weight: 146 lb = BMI 26 (overweight)   Weight: 152 lb = BMI 27 (overweight)   Weight: 158 lb = BMI 28 (overweight)   Weight: 163 lb = BMI 29 (overweight)   Weight: 169 lb = BMI 30 (obese)   Weight: 175 lb = BMI 31 (obese)   Weight: 180 lb = BMI 32 (obese)   Weight: 186 lb = BMI 33 (obese)   Weight: 191 lb = BMI 34 (obese)   Weight: 197 lb = BMI 35 (obese)   Weight: 203 lb = BMI 36 (obese)   Weight: 208 lb = BMI 37 (obese)   Weight: 214 lb = BMI 38 (obese)   Weight: 220 lb = BMI 39 (obese)   Weight: 225 lb = BMI 40 (extreme obesity)   Weight: 231 lb = BMI 41 (extreme obesity)   Weight: 237 lb = BMI 42 (extreme obesity)   Weight: 242 lb = BMI 43 (extreme obesity)   Weight: 248 lb = BMI 44 (extreme obesity)   Weight: 254 lb = BMI 45 (extreme obesity)   Weight: 259 lb = BMI 46 (extreme obesity)   Weight: 265 lb = BMI 47 (extreme obesity)   Weight: 270 lb = BMI 48 (extreme obesity)   Weight: 278 lb = BMI 49 (extreme obesity)   Weight: 282 lb = BMI 50 (extreme obesity)   Weight: 287 lb = BMI 51 (extreme obesity)   Weight: 293 lb = BMI 52 (extreme obesity)   Weight: 299 lb = BMI 53 (extreme obesity)   Weight: 304 lb = BMI 54 (extreme obesity)  Height: 64 inches  Weight: 110 lb = BMI 19 (normal)   Weight: 116 lb = BMI 20 (normal)   Weight: 122 lb = BMI 21 (normal)   Weight: 128 lb = BMI 22 (normal)   Weight: 134 lb = BMI 23 (normal)   Weight: 140 lb = BMI 24 (normal)   Weight: 145 lb = BMI 25 (overweight)   Weight: 151 lb = BMI 26 (overweight)   Weight: 157 lb = BMI 27 (overweight)   Weight: 163 lb = BMI 28 (overweight)   Weight: 169 lb = BMI 29 (overweight)   Weight: 174 lb = BMI 30 (obese)   Weight: 180 lb = BMI 31  (obese)   Weight: 186 lb = BMI 32 (obese)   Weight: 192 lb = BMI 33 (obese)   Weight: 197 lb = BMI 34 (obese)   Weight: 204 lb = BMI 35 (obese)   Weight: 209 lb = BMI 36 (obese)   Weight: 215 lb = BMI 37 (obese)   Weight: 221 lb = BMI 38 (obese)   Weight: 227 lb = BMI 39 (obese)   Weight: 232 lb = BMI 40 (extreme obesity)   Weight: 238 lb = BMI 41 (extreme obesity)   Weight: 244 lb = BMI 42 (extreme obesity)   Weight: 250 lb = BMI 43 (extreme obesity)   Weight: 256 lb = BMI 44 (extreme obesity)   Weight: 262 lb = BMI 45 (extreme obesity)   Weight: 267 lb = BMI 46 (extreme obesity)   Weight: 273 lb = BMI 47 (extreme obesity)   Weight: 279 lb = BMI 48 (extreme obesity)   Weight: 285 lb = BMI 49 (extreme obesity)   Weight: 291 lb =  BMI 50 (extreme obesity)   Weight: 296 lb = BMI 51 (extreme obesity)   Weight: 302 lb = BMI 52 (extreme obesity)   Weight: 308 lb = BMI 53 (extreme obesity)   Weight: 314 lb = BMI 54 (extreme obesity)  Height: 65 inches  Weight: 114 lb = BMI 19 (normal)   Weight: 120 lb = BMI 20 (normal)   Weight: 126 lb = BMI 21 (normal)   Weight: 132 lb = BMI 22 (normal)   Weight: 138 lb = BMI 23 (normal)   Weight: 144 lb = BMI 24 (normal)   Weight: 150 lb = BMI 25 (overweight)   Weight: 156 lb = BMI 26 (overweight)   Weight: 162 lb = BMI 27 (overweight)   Weight: 168 lb = BMI 28 (overweight)   Weight: 174 lb = BMI 29 (overweight)   Weight: 180 lb = BMI 30 (obese)   Weight: 186 lb = BMI 31 (obese)   Weight: 192 lb = BMI 32 (obese)   Weight: 198 lb = BMI 33 (obese)   Weight: 204 lb = BMI 34 (obese)   Weight: 210 lb = BMI 35 (obese)   Weight: 216 lb = BMI 36 (obese)   Weight: 222 lb = BMI 37 (obese)   Weight: 228 lb = BMI 38 (obese)   Weight: 234 lb = BMI 39 (obese)   Weight: 240 lb = BMI 40 (extreme obesity)   Weight: 246 lb = BMI 41 (extreme obesity)   Weight: 252 lb = BMI 42 (extreme obesity)   Weight: 258 lb  = BMI 43 (extreme obesity)   Weight: 264 lb = BMI 44 (extreme obesity)   Weight: 270 lb = BMI 45 (extreme obesity)   Weight: 276 lb = BMI 46 (extreme obesity)   Weight: 282 lb = BMI 47 (extreme obesity)   Weight: 288 lb = BMI 48 (extreme obesity)   Weight: 294 lb = BMI 49 (extreme obesity)   Weight: 300 lb = BMI 50 (extreme obesity)   Weight: 306 lb = BMI 51 (extreme obesity)   Weight: 312 lb = BMI 52 (extreme obesity)   Weight: 318 lb = BMI 53 (extreme obesity)   Weight: 324 lb = BMI 54 (extreme obesity)  Height: 66 inches  Weight: 118 lb = BMI 19 (normal)   Weight: 124 lb = BMI 20 (normal)   Weight: 130 lb = BMI 21 (normal)   Weight: 136 lb = BMI 22 (normal)   Weight: 142 lb = BMI 23 (normal)   Weight: 148 lb = BMI 24 (normal)   Weight: 155 lb = BMI 25 (overweight)   Weight: 161 lb = BMI 26 (overweight)   Weight: 167 lb = BMI 27 (overweight)   Weight: 173 lb = BMI 28 (overweight)   Weight: 179 lb = BMI 29 (overweight)   Weight: 186 lb = BMI 30 (obese)   Weight: 192 lb = BMI 31 (obese)   Weight: 198 lb = BMI 32 (obese)   Weight: 204 lb = BMI 33 (obese)   Weight: 210 lb = BMI 34 (obese)   Weight: 216 lb = BMI 35 (obese)   Weight: 223 lb = BMI 36 (obese)   Weight: 229 lb = BMI 37 (obese)   Weight: 235 lb = BMI 38 (obese)   Weight: 241 lb = BMI 39 (obese)   Weight: 247 lb = BMI 40 (extreme obesity)   Weight: 253 lb = BMI 41 (extreme obesity)  Weight: 260 lb = BMI 42 (extreme obesity)   Weight: 266 lb = BMI 43 (extreme obesity)   Weight: 272 lb = BMI 44 (extreme obesity)   Weight: 278 lb = BMI 45 (extreme obesity)   Weight: 284 lb = BMI 46 (extreme obesity)   Weight: 291 lb = BMI 47 (extreme obesity)   Weight: 297 lb = BMI 48 (extreme obesity)   Weight: 303 lb = BMI 49 (extreme obesity)   Weight: 309 lb = BMI 50 (extreme obesity)   Weight: 315 lb = BMI 51 (extreme obesity)   Weight: 322 lb = BMI 52 (extreme obesity)   Weight:  328 lb = BMI 53 (extreme obesity)   Weight: 334 lb = BMI 54 (extreme obesity)  Height: 67 inches  Weight: 121 lb = BMI 19 (normal)   Weight: 127 lb = BMI 20 (normal)   Weight: 134 lb = BMI 21 (normal)   Weight: 140 lb = BMI 22 (normal)   Weight: 146 lb = BMI 23 (normal)   Weight: 153 lb = BMI 24 (normal)   Weight: 159 lb = BMI 25 (overweight)   Weight: 166 lb = BMI 26 (overweight)   Weight: 172 lb = BMI 27 (overweight)   Weight: 178 lb = BMI 28 (overweight)   Weight: 185 lb = BMI 29 (overweight)   Weight: 191 lb = BMI 30 (obese)   Weight: 198 lb = BMI 31 (obese)   Weight: 204 lb = BMI 32 (obese)   Weight: 211 lb = BMI 33 (obese)   Weight: 217 lb = BMI 34 (obese)   Weight: 223 lb = BMI 35 (obese)   Weight: 230 lb = BMI 36 (obese)   Weight: 236 lb = BMI 37 (obese)   Weight: 242 lb = BMI 38 (obese)   Weight: 249 lb = BMI 39 (obese)   Weight: 255 lb = BMI 40 (extreme obesity)   Weight: 261 lb = BMI 41 (extreme obesity)   Weight: 268 lb = BMI 42 (extreme obesity)   Weight: 274 lb = BMI 43 (extreme obesity)   Weight: 280 lb = BMI 44 (extreme obesity)   Weight: 287 lb = BMI 45 (extreme obesity)   Weight: 293 lb = BMI 46 (extreme obesity)   Weight: 299 lb = BMI 47 (extreme obesity)   Weight: 306 lb = BMI 48 (extreme obesity)   Weight: 312 lb = BMI 49 (extreme obesity)   Weight: 319 lb = BMI 50 (extreme obesity)   Weight: 325 lb = BMI 51 (extreme obesity)   Weight: 331 lb = BMI 52 (extreme obesity)   Weight: 338 lb = BMI 53 (extreme obesity)   Weight: 344 lb = BMI 54 (extreme obesity)  Height: 68 inches  Weight: 125 lb = BMI 19 (normal)   Weight: 131 lb = BMI 20 (normal)   Weight: 138 lb = BMI 21 (normal)   Weight: 144 lb = BMI 22 (normal)   Weight: 151 lb = BMI 23 (normal)   Weight: 158 lb = BMI 24 (normal)   Weight: 164 lb = BMI 25 (overweight)   Weight: 171 lb = BMI 26 (overweight)   Weight: 177 lb = BMI 27 (overweight)    Weight: 184 lb = BMI 28 (overweight)   Weight: 190 lb = BMI 29 (overweight)   Weight: 197 lb = BMI 30 (obese)   Weight: 203 lb = BMI 31 (obese)   Weight: 210 lb = BMI 32 (  obese)   Weight: 216 lb = BMI 33 (obese)   Weight: 223 lb = BMI 34 (obese)   Weight: 230 lb = BMI 35 (obese)   Weight: 236 lb = BMI 36 (obese)   Weight: 243 lb = BMI 37 (obese)   Weight: 249 lb = BMI 38 (obese)   Weight: 256 lb = BMI 39 (obese)   Weight: 262 lb = BMI 40 (extreme obesity)   Weight: 269 lb = BMI 41 (extreme obesity)   Weight: 276 lb = BMI 42 (extreme obesity)   Weight: 282 lb = BMI 43 (extreme obesity)   Weight: 289 lb = BMI 44 (extreme obesity)   Weight: 295 lb = BMI 45 (extreme obesity)   Weight: 302 lb = BMI 46 (extreme obesity)   Weight: 308 lb = BMI 47 (extreme obesity)   Weight: 315 lb = BMI 48 (extreme obesity)   Weight: 322 lb = BMI 49 (extreme obesity)   Weight: 328 lb = BMI 50 (extreme obesity)   Weight: 335 lb = BMI 51 (extreme obesity)   Weight: 341 lb = BMI 52 (extreme obesity)   Weight: 348 lb = BMI 53 (extreme obesity)   Weight: 354 lb = BMI 54 (extreme obesity)  Height: 69 inches  Weight: 128 lb = BMI 19 (normal)   Weight: 135 lb = BMI 20 (normal)   Weight: 142 lb = BMI 21 (normal)   Weight: 149 lb = BMI 22 (normal)   Weight: 155 lb = BMI 23 (normal)   Weight: 162 lb = BMI 24 (normal)   Weight: 169 lb = BMI 25 (overweight)   Weight: 176 lb = BMI 26 (overweight)   Weight: 182 lb = BMI 27 (overweight)   Weight: 189 lb = BMI 28 (overweight)   Weight: 196 lb = BMI 29 (overweight)   Weight: 203 lb = BMI 30 (obese)   Weight: 209 lb = BMI 31 (obese)   Weight: 216 lb = BMI 32 (obese)   Weight: 223 lb = BMI 33 (obese)   Weight: 230 lb = BMI 34 (obese)   Weight: 236 lb = BMI 35 (obese)   Weight: 243 lb = BMI 36 (obese)   Weight: 250 lb = BMI 37 (obese)   Weight: 257 lb = BMI 38 (obese)   Weight: 263 lb = BMI 39 (obese)   Weight:  270 lb = BMI 40 (extreme obesity)   Weight: 277 lb = BMI 41 (extreme obesity)   Weight: 284 lb = BMI 42 (extreme obesity)   Weight: 291 lb = BMI 43 (extreme obesity)   Weight: 297 lb = BMI 44 (extreme obesity)   Weight: 304 lb = BMI 45 (extreme obesity)   Weight: 311 lb = BMI 46 (extreme obesity)   Weight: 318 lb = BMI 47 (extreme obesity)   Weight: 324 lb = BMI 48 (extreme obesity)   Weight: 331 lb = BMI 49 (extreme obesity)   Weight: 338 lb = BMI 50 (extreme obesity)   Weight: 345 lb = BMI 51 (extreme obesity)   Weight: 351 lb = BMI 52 (extreme obesity)   Weight: 358 lb = BMI 53 (extreme obesity)   Weight: 365 lb = BMI 54 (extreme obesity)  Height: 70 inches  Weight: 132 lb = BMI 19 (normal)   Weight: 139 lb = BMI 20 (normal)   Weight: 146 lb = BMI 21 (normal)   Weight: 153 lb = BMI 22 (normal)   Weight: 160  lb = BMI 23 (normal)   Weight: 167 lb = BMI 24 (normal)   Weight: 174 lb = BMI 25 (overweight)   Weight: 181 lb = BMI 26 (overweight)   Weight: 188 lb = BMI 27 (overweight)   Weight: 195 lb = BMI 28 (overweight)   Weight: 202 lb = BMI 29 (overweight)   Weight: 209 lb = BMI 30 (obese)   Weight: 216 lb = BMI 31 (obese)   Weight: 222 lb = BMI 32 (obese)   Weight: 229 lb = BMI 33 (obese)   Weight: 236 lb = BMI 34 (obese)   Weight: 243 lb = BMI 35 (obese)   Weight: 250 lb = BMI 36 (obese)   Weight: 257 lb = BMI 37 (obese)   Weight: 264 lb = BMI 38 (obese)   Weight: 271 lb = BMI 39 (obese)   Weight: 278 lb = BMI 40 (extreme obesity)   Weight: 285 lb = BMI 41 (extreme obesity)   Weight: 292 lb = BMI 42 (extreme obesity)   Weight: 299 lb = BMI 43 (extreme obesity)   Weight: 306 lb = BMI 44 (extreme obesity)   Weight: 313 lb = BMI 45 (extreme obesity)   Weight: 320 lb = BMI 46 (extreme obesity)   Weight: 327 lb = BMI 47 (extreme obesity)   Weight: 334 lb = BMI 48 (extreme obesity)   Weight: 341 lb = BMI 49 (extreme obesity)    Weight: 348 lb = BMI 50 (extreme obesity)   Weight: 355 lb = BMI 51 (extreme obesity)   Weight: 362 lb = BMI 52 (extreme obesity)   Weight: 369 lb = BMI 53 (extreme obesity)   Weight: 376 lb = BMI 54 (extreme obesity)  Height: 71 inches  Weight: 136 lb = BMI 19 (normal)   Weight: 143 lb = BMI 20 (normal)   Weight: 150 lb = BMI 21 (normal)   Weight: 157 lb = BMI 22 (normal)   Weight: 165 lb = BMI 23 (normal)   Weight: 172 lb = BMI 24 (normal)   Weight: 179 lb = BMI 25 (overweight)   Weight: 186 lb = BMI 26 (overweight)   Weight: 193 lb = BMI 27 (overweight)   Weight: 200 lb = BMI 28 (overweight)   Weight: 208 lb = BMI 29 (overweight)   Weight: 215 lb = BMI 30 (obese)   Weight: 222 lb = BMI 31 (obese)   Weight: 229 lb = BMI 32 (obese)   Weight: 236 lb = BMI 33 (obese)   Weight: 243 lb = BMI 34 (obese)   Weight: 250 lb = BMI 35 (obese)   Weight: 257 lb = BMI 36 (obese)   Weight: 265 lb = BMI 37 (obese)   Weight: 272 lb = BMI 38 (obese)   Weight: 279 lb = BMI 39 (obese)   Weight: 286 lb = BMI 40 (extreme obesity)   Weight: 293 lb = BMI 41 (extreme obesity)   Weight: 301 lb = BMI 42 (extreme obesity)   Weight: 308 lb = BMI 43 (extreme obesity)   Weight: 315 lb = BMI 44 (extreme obesity)   Weight: 322 lb = BMI 45 (extreme obesity)   Weight: 329 lb = BMI 46 (extreme obesity)   Weight: 338 lb = BMI 47 (extreme obesity)   Weight: 343 lb = BMI 48 (extreme obesity)   Weight: 351 lb = BMI 49 (extreme obesity)   Weight: 358 lb = BMI 50 (  extreme obesity)   Weight: 365 lb = BMI 51 (extreme obesity)   Weight: 372 lb = BMI 52 (extreme obesity)   Weight: 379 lb = BMI 53 (extreme obesity)   Weight: 386 lb = BMI 54 (extreme obesity)  Height: 72 inches  Weight: 140 lb = BMI 19 (normal)   Weight: 147 lb = BMI 20 (normal)   Weight: 154 lb = BMI 21 (normal)   Weight: 162 lb = BMI 22 (normal)   Weight: 169 lb = BMI 23 (normal)   Weight: 177 lb  = BMI 24 (normal)   Weight: 184 lb = BMI 25 (overweight)   Weight: 191 lb = BMI 26 (overweight)   Weight: 199 lb = BMI 27 (overweight)   Weight: 206 lb = BMI 28 (overweight)   Weight: 213 lb = BMI 29 (overweight)   Weight: 221 lb = BMI 30 (obese)   Weight: 228 lb = BMI 31 (obese)   Weight: 235 lb = BMI 32 (obese)   Weight: 242 lb = BMI 33 (obese)   Weight: 250 lb = BMI 34 (obese)   Weight: 258 lb = BMI 35 (obese)   Weight: 265 lb = BMI 36 (obese)   Weight: 272 lb = BMI 37 (obese)   Weight: 279 lb = BMI 38 (obese)   Weight: 287 lb = BMI 39 (obese)   Weight: 294 lb = BMI 40 (extreme obesity)   Weight: 302 lb = BMI 41 (extreme obesity)   Weight: 309 lb = BMI 42 (extreme obesity)   Weight: 316 lb = BMI 43 (extreme obesity)   Weight: 324 lb = BMI 44 (extreme obesity)   Weight: 331 lb = BMI 45 (extreme obesity)   Weight: 338 lb = BMI 46 (extreme obesity)   Weight: 346 lb = BMI 47 (extreme obesity)   Weight: 353 lb = BMI 48 (extreme obesity)   Weight: 361 lb = BMI 49 (extreme obesity)   Weight: 368 lb = BMI 50 (extreme obesity)   Weight: 375 lb = BMI 51 (extreme obesity)   Weight: 383 lb = BMI 52 (extreme obesity)   Weight: 390 lb = BMI 53 (extreme obesity)   Weight: 397 lb = BMI 54 (extreme obesity)  Height: 73 inches  Weight: 144 lb = BMI 19 (normal)   Weight: 151 lb = BMI 20 (normal)   Weight: 159 lb = BMI 21 (normal)   Weight: 166 lb = BMI 22 (normal)   Weight: 174 lb = BMI 23 (normal)   Weight: 182 lb = BMI 24 (normal)   Weight: 189 lb = BMI 25 (overweight)   Weight: 197 lb = BMI 26 (overweight)   Weight: 204 lb = BMI 27 (overweight)   Weight: 212 lb = BMI 28 (overweight)   Weight: 219 lb = BMI 29 (overweight)   Weight: 227 lb = BMI 30 (obese)   Weight: 235 lb = BMI 31 (obese)   Weight: 242 lb = BMI 32 (obese)   Weight: 250 lb = BMI 33 (obese)   Weight: 257 lb = BMI 34 (obese)   Weight: 265 lb = BMI 35 (obese)   Weight:  272 lb = BMI 36 (obese)   Weight: 280 lb = BMI 37 (obese)   Weight: 288 lb = BMI 38 (obese)   Weight: 295 lb = BMI 39 (obese)   Weight: 302 lb = BMI 40 (extreme obesity)   Weight: 310 lb = BMI 41 (extreme obesity)  Weight: 318 lb = BMI 42 (extreme obesity)   Weight: 325 lb = BMI 43 (extreme obesity)   Weight: 333 lb = BMI 44 (extreme obesity)   Weight: 340 lb = BMI 45 (extreme obesity)   Weight: 348 lb = BMI 46 (extreme obesity)   Weight: 355 lb = BMI 47 (extreme obesity)   Weight: 363 lb = BMI 48 (extreme obesity)   Weight: 371 lb = BMI 49 (extreme obesity)   Weight: 378 lb = BMI 50 (extreme obesity)   Weight: 386 lb = BMI 51 (extreme obesity)   Weight: 393 lb = BMI 52 (extreme obesity)   Weight: 401 lb = BMI 53 (extreme obesity)   Weight: 408 lb = BMI 54 (extreme obesity)  Height: 74 inches  Weight: 148 lb = BMI 19 (normal)   Weight: 155 lb = BMI 20 (normal)   Weight: 163 lb = BMI 21 (normal)   Weight: 171 lb = BMI 22 (normal)   Weight: 179 lb = BMI 23 (normal)   Weight: 186 lb = BMI 24 (normal)   Weight: 194 lb = BMI 25 (overweight)   Weight: 202 lb = BMI 26 (overweight)   Weight: 210 lb = BMI 27 (overweight)   Weight: 218 lb = BMI 28 (overweight)   Weight: 225 lb = BMI 29 (overweight)   Weight: 233 lb = BMI 30 (obese)   Weight: 241 lb = BMI 31 (obese)   Weight: 249 lb = BMI 32 (obese)   Weight: 256 lb = BMI 33 (obese)   Weight: 264 lb = BMI 34 (obese)   Weight: 272 lb = BMI 35 (obese)   Weight: 280 lb = BMI 36 (obese)   Weight: 287 lb = BMI 37 (obese)   Weight: 295 lb = BMI 38 (obese)   Weight: 303 lb = BMI 39 (obese)   Weight: 311 lb = BMI 40 (extreme obesity)   Weight: 319 lb = BMI 41 (extreme obesity)   Weight: 326 lb = BMI 42 (extreme obesity)   Weight: 334 lb = BMI 43 (extreme obesity)   Weight: 342 lb = BMI 44 (extreme obesity)   Weight: 350 lb = BMI 45 (extreme obesity)   Weight: 358 lb = BMI 46 (extreme obesity)    Weight: 365 lb = BMI 47 (extreme obesity)   Weight: 373 lb = BMI 48 (extreme obesity)   Weight: 381 lb = BMI 49 (extreme obesity)   Weight: 389 lb = BMI 50 (extreme obesity)   Weight: 396 lb = BMI 51 (extreme obesity)   Weight: 404 lb = BMI 52 (extreme obesity)   Weight: 412 lb = BMI 53 (extreme obesity)   Weight: 420 lb = BMI 54 (extreme obesity)  Height: 75 inches  Weight: 152 lb = BMI 19 (normal)   Weight: 160 lb = BMI 20 (normal)   Weight: 168 lb = BMI 21 (normal)   Weight: 176 lb = BMI 22 (normal)   Weight: 184 lb = BMI 23 (normal)   Weight: 192 lb = BMI 24 (normal)   Weight: 200 lb = BMI 25 (overweight)   Weight: 208 lb = BMI 26 (overweight)   Weight: 216 lb = BMI 27 (overweight)   Weight: 224 lb = BMI 28 (overweight)   Weight: 232 lb = BMI 29 (overweight)   Weight: 240 lb = BMI 30 (obese)   Weight: 248 lb = BMI 31 (obese)   Weight: 256 lb = BMI 32 (obese)  Weight: 264 lb = BMI 33 (obese)   Weight: 272 lb = BMI 34 (obese)   Weight: 279 lb = BMI 35 (obese)   Weight: 287 lb = BMI 36 (obese)   Weight: 295 lb = BMI 37 (obese)   Weight: 303 lb = BMI 38 (obese)   Weight: 311 lb = BMI 39 (obese)   Weight: 319 lb = BMI 40 (extreme obesity)   Weight: 327 lb = BMI 41 (extreme obesity)   Weight: 335 lb = BMI 42 (extreme obesity)   Weight: 343 lb = BMI 43 (extreme obesity)   Weight: 351 lb = BMI 44 (extreme obesity)   Weight: 359 lb = BMI 45 (extreme obesity)   Weight: 367 lb = BMI 46 (extreme obesity)   Weight: 375 lb = BMI 47 (extreme obesity)   Weight: 383 lb = BMI 48 (extreme obesity)   Weight: 391 lb = BMI 49 (extreme obesity)   Weight: 399 lb = BMI 50 (extreme obesity)   Weight: 407 lb = BMI 51 (extreme obesity)   Weight: 415 lb = BMI 52 (extreme obesity)   Weight: 423 lb = BMI 53 (extreme obesity)   Weight: 431 lb = BMI 54 (extreme obesity)  Height: 76 inches  Weight: 156 lb = BMI 19 (normal)   Weight: 164 lb = BMI 20  (normal)   Weight: 172 lb = BMI 21 (normal)   Weight: 180 lb = BMI 22 (normal)   Weight: 189 lb = BMI 23 (normal)   Weight: 197 lb = BMI 24 (normal)   Weight: 205 lb = BMI 25 (overweight)   Weight: 213 lb = BMI 26 (overweight)   Weight: 221 lb = BMI 27 (overweight)   Weight: 230 lb = BMI 28 (overweight)   Weight: 238 lb = BMI 29 (overweight)   Weight: 246 lb = BMI 30 (obese)   Weight: 254 lb = BMI 31 (obese)   Weight: 263 lb = BMI 32 (obese)   Weight: 271 lb = BMI 33 (obese)   Weight: 279 lb = BMI 34 (obese)   Weight: 287 lb = BMI 35 (obese)   Weight: 295 lb = BMI 36 (obese)   Weight: 304 lb = BMI 37 (obese)   Weight: 312 lb = BMI 38 (obese)   Weight: 320 lb = BMI 39 (obese)   Weight: 328 lb = BMI 40 (extreme obesity)   Weight: 336 lb = BMI 41 (extreme obesity)   Weight: 344 lb = BMI 42 (extreme obesity)   Weight: 353 lb = BMI 43 (extreme obesity)   Weight: 361 lb = BMI 44 (extreme obesity)   Weight: 369 lb = BMI 45 (extreme obesity)   Weight: 377 lb = BMI 46 (extreme obesity)   Weight: 385 lb = BMI 47 (extreme obesity)   Weight: 394 lb = BMI 48 (extreme obesity)   Weight: 402 lb = BMI 49 (extreme obesity)   Weight: 410 lb = BMI 50 (extreme obesity)   Weight: 418 lb = BMI 51 (extreme obesity)   Weight: 426 lb = BMI 52 (extreme obesity)   Weight: 435 lb = BMI 53 (extreme obesity)   Weight: 443 lb = BMI 54 (extreme obesity)  Source: Adapted from Clinical Guidelines on the Identification, Evaluation, and Treatment of Overweight and Obesity in Adults: The Evidence Report. HEALTH RISK CLASSIFICATION ACCORDING TO BODY MASS INDEX (BMI) Classification: Underweight.  BMI Category:  less than 18.5   Risk of developing health problems:  Increased.  Classification: Normal Weight.  BMI Category: 18.5 to 24.9   Risk of developing health problems: Least.  Classification: Overweight.  BMI Category: 25.0 to 29.9   Risk of developing health problems:  Increased.  Classification: Obese class.  BMI Category:  30.0 to 34.9   Risk of developing health problems: High.  Classification: Obese class II.  BMI Category:  35.0 to 39.9   Risk of developing health problems: Very high.  Classification: Obese class III.  BMI Category: 40.0 or more   Risk of developing health problems: Extremely high.  Note: For persons 69 years and older the 'normal' range may begin slightly above BMI 18.5 and extend into the 'overweight' range.   To clarify risk for each individual, other factors also need to be considered, such as:   Lifestyle habits.   Fitness level.   Presence or absence of other health risk conditions.   The classification system may underestimate or overestimate health risks in certain adults, such as:   Highly muscular adults. Very muscular adults, such as athletes, may have a low percentage of body fat but a large amount of muscle tissue. This can result in a BMI in the overweight range that may over estimate the risk of developing health problems.   Adults who naturally have a very lean body build.   Young adults who have not reached full growth.   Adults over 31 years of age. For adults over age 64, more research is needed to determine if the cut-off points for the 'normal weight' range differ in any way from those for younger adults.   It is also important to note that BMI is only one part of a health risk assessment. To further clarify risk, other factors need to be considered as well.   Age, inherited traits, presence or absence of other conditions such as diabetes, high blood lipids, hypertension, and high blood glucose levels also influence the development of diseases associated with overweight. Risk factors such as poor eating habits, physical inactivity, and tobacco use can play a role in the development of diseases associated with both overweight and underweight.  Consult a caregiver for a more complete assessment of your  weight as it relates to health risk. It is important to discuss with your caregiver what BMI means for you as an individual. Maintaining a 'normal weight' is one element of good health. However, unhealthy eating habits, low levels of physical activity and tobacco use will increase the risk of health problems even for those within the normal weight range.  Being overweight indicates some risk to health. But research suggests that regular physical activity can decrease the risk of several health problems. Equally, a nutritious diet has been shown to decrease some of the risks associated with overweight. It is important to emphasize that a weight classification system is but one tool to assess health risks in individuals.  Document Released: 04/12/2004 Document Revised: 07/21/2011 Document Reviewed: 05/11/2005 [facility] Patient Information 2012 [place], [place].De Quervain's Disease Suzette Battiest disease is a condition often seen in racquet sports where there is a soreness (inflammation) in the cord like structures (tendons) which attach muscle to bone on the thumb side of the wrist. There may be a tightening of the tissuesaround the tendons. This condition is often helped by giving up or modifying the activity which caused it. When conservative treatment does not help, surgery may be required. Conservative treatment could include changes in the activity which brought about the problem or made it  worse. Anti-inflammatory medications and injections may be used to help decrease the inflammation and help with pain control. Your caregiver will help you determine which is best for you. DIAGNOSIS  Often the diagnosis (learning what is wrong) can be made by examination. Sometimes x-rays are required. HOME CARE INSTRUCTIONS   Apply ice to the sore area for 15 to 20 minutes, 3 to 4 times per day while awake. Put the ice in a plastic bag and place a towel between the bag of ice and your skin. This is especially helpful  if it can be done after all activities involving the sore wrist.   Temporary splinting may help.   Only take over-the-counter or prescription medicines for pain, discomfort or fever as directed by your caregiver.  SEEK MEDICAL CARE IF:   Pain relief is not obtained with medications, or if you have increasing pain and seem to be getting worse rather than better.  MAKE SURE YOU:   Understand these instructions.   Will watch your condition.   Will get help right away if you are not doing well or get worse.  Document Released: 04/26/2001 Document Revised: 07/21/2011 Document Reviewed: 08/01/2005 [facility] Patient Information 2012 [place], [place].

## 2012-03-20 NOTE — Progress Notes (Signed)
Subjective:    Patient ID: Albert Hogan, male    DOB: [birth year], 41 y.o.   MRN: [id number]  Hypertension This is a chronic problem. The current episode started more than 1 year ago. The problem has been rapidly improving since onset. The problem is controlled. Pertinent negatives include no anxiety, blurred vision, chest pain, headaches, malaise/fatigue, neck pain, orthopnea, palpitations, PND, shortness of breath or sweats. There are no associated agents to hypertension. Risk factors for coronary artery disease include male gender. Past treatments include alpha 1 blockers and diuretics. The current treatment provides moderate improvement. There are no compliance problems.  There is no history of angina, kidney disease, CAD/MI, CVA, heart failure, left ventricular hypertrophy, PVD, renovascular disease or retinopathy. There is no history of chronic renal disease, coarctation of the aorta, hyperparathyroidism, a hypertension causing med or sleep apnea.      Review of Systems  Constitutional: Negative.  Negative for malaise/fatigue.  HENT: Negative.  Negative for neck pain.   Eyes: Negative for blurred vision.  Respiratory: Negative for shortness of breath.   Cardiovascular: Negative for chest pain, palpitations, orthopnea and PND.  Musculoskeletal:       He reports numbness and tingling in his left hand over the phone point finger and index finger at times. He denies that it occurs all the time and is somewhat aggravating he reports when it does occur  Neurological: Negative for headaches.  All other systems reviewed and are negative.        BP 126/84  Pulse 80  Ht 6\' 1"  (1.854 m)  Wt 241 lb (109.317 kg)  BMI 31.80 kg/m2  SpO2 100% Objective:   Physical Exam  Constitutional: He is oriented to person, place, and time. He appears well-developed and well-nourished.  HENT:  Head: Normocephalic and atraumatic.  Cardiovascular: Normal rate, regular rhythm, normal heart sounds and intact  distal pulses.   Pulmonary/Chest: Effort normal and breath sounds normal. No respiratory distress. He has no wheezes.  Musculoskeletal: He exhibits no edema.       The pain and numbness that the patient has experienced was reproduced with palpation of the radial nerve over the distal radial bowel.  Neurological: He is alert and oriented to person, place, and time.  Skin: Skin is warm and dry.  Psychiatric: He has a normal mood and affect.      Results for orders placed in visit on 07/19/11  CBC WITH DIFFERENTIAL      Component Value Range   WBC 6.4  4.0 - 10.5 K/uL   RBC 4.96  4.22 - 5.81 MIL/uL   Hemoglobin 14.8  13.0 - 17.0 g/dL   HCT 42.5  95.6 - 38.7 %   MCV 87.5  78.0 - 100.0 fL   MCH 29.8  26.0 - 34.0 pg   MCHC 34.1  30.0 - 36.0 g/dL   RDW 56.4  33.2 - 95.1 %   Platelets 193  150 - 400 K/uL   Neutrophils Relative 58  43 - 77 %   Neutro Abs 3.7  1.7 - 7.7 K/uL   Lymphocytes Relative 31  12 - 46 %   Lymphs Abs 2.0  0.7 - 4.0 K/uL   Monocytes Relative 9  3 - 12 %   Monocytes Absolute 0.6  0.1 - 1.0 K/uL   Eosinophils Relative 3  0 - 5 %   Eosinophils Absolute 0.2  0.0 - 0.7 K/uL   Basophils Relative 0  0 - 1 %   Basophils  Absolute 0.0  0.0 - 0.1 K/uL   Smear Review Criteria for review not met    BASIC METABOLIC PANEL WITH GFR      Component Value Range   Sodium 139  135 - 145 mEq/L   Potassium 4.1  3.5 - 5.3 mEq/L   Chloride 102  96 - 112 mEq/L   CO2 28  19 - 32 mEq/L   Glucose, Bld 74  70 - 99 mg/dL   BUN 20  6 - 23 mg/dL   Creat 9.60  4.54 - 0.98 mg/dL   Calcium 9.4  8.4 - 11.9 mg/dL   GFR, Est African American 82     GFR, Est Non African American 71    HIV ANTIBODY (ROUTINE TESTING)      Component Value Range   HIV NON REACTIVE  NON REACTIVE      Assessment & Plan:     #1 hypertension. Due to elevated co-pay the need to use to medications Avapro and HCTZ to control his blood pressure we will switch him back to Benicar and add HCTZ 12.5 to that as well.  [place] we do have discount card twice of the Benicar for him is cheaper and Avapro.  #2 erectile dysfunction. Cialis doing well with that no new changes  #3 de Quervain's syndrome of the left wrist and hand. Provide some information for him about the situation. Mobic 15 mg one tablet a day. If things not improve may need splints, injection, or even surgery.  Return in 4 months for yearly exam.

## 2012-05-11 ENCOUNTER — Other Ambulatory Visit: Payer: Self-pay | Admitting: *Deleted

## 2012-05-11 MED ORDER — OLMESARTAN MEDOXOMIL-HCTZ 40-12.5 MG PO TABS: 1.0000 | ORAL_TABLET | Freq: Every day | ORAL | Status: DC

## 2012-07-17 ENCOUNTER — Encounter: Payer: Self-pay | Admitting: Family Medicine

## 2012-07-17 ENCOUNTER — Ambulatory Visit (INDEPENDENT_AMBULATORY_CARE_PROVIDER_SITE_OTHER): Payer: 59 | Admitting: Family Medicine

## 2012-07-17 VITALS — BP 113/75 | HR 82 | Ht 73.0 in | Wt 244.0 lb

## 2012-07-17 DIAGNOSIS — N529 Male erectile dysfunction, unspecified: Secondary | ICD-10-CM

## 2012-07-17 DIAGNOSIS — I10 Essential (primary) hypertension: Secondary | ICD-10-CM

## 2012-07-17 MED ORDER — TADALAFIL 20 MG PO TABS: 20.0000 mg | ORAL_TABLET | Freq: Every day | ORAL | Status: DC | PRN

## 2012-07-17 MED ORDER — OLMESARTAN MEDOXOMIL-HCTZ 40-12.5 MG PO TABS: 1.0000 | ORAL_TABLET | Freq: Every day | ORAL | Status: DC

## 2012-07-17 NOTE — Patient Instructions (Addendum)

## 2012-07-17 NOTE — Progress Notes (Signed)
  Subjective:    Patient ID: Albert Hogan, male    DOB: 12-Sep-1970, 41 y.o.   MRN: [id number]  HPI #1 hypertension. Report no problem with Benicar/HCT 4012.5.  #2 rectal dysfunction. Cialis is doing well also.  Review of Systems  All other systems reviewed and are negative.      BP 113/75  Pulse 82  Ht 6\' 1"  (1.854 m)  Wt 244 lb (110.678 kg)  BMI 32.19 kg/m2 Objective:   Physical Exam  Vitals reviewed. Constitutional: He is oriented to person, place, and time. He appears well-developed and well-nourished.  HENT:  Head: Normocephalic and atraumatic.  Neck: Neck supple.  Cardiovascular: Normal rate, regular rhythm and normal heart sounds.   Pulmonary/Chest: Effort normal and breath sounds normal.  Musculoskeletal: Normal range of motion. He exhibits no edema and no tenderness.  Neurological: He is alert and oriented to person, place, and time.  Skin: Skin is warm and dry.  Psychiatric: He has a normal mood and affect.      Assessment & Plan:  #1 hypertension. Will continue on Benicar 40/12.5 one tablet return in about 6 months followup.  #2 erectile dysfunction. Cialis is doing well. Maintain on Cialis 20 mg a day.

## 2012-12-27 ENCOUNTER — Ambulatory Visit (INDEPENDENT_AMBULATORY_CARE_PROVIDER_SITE_OTHER): Payer: 59 | Admitting: Family Medicine

## 2012-12-27 ENCOUNTER — Encounter: Payer: Self-pay | Admitting: Family Medicine

## 2012-12-27 VITALS — BP 119/84 | HR 81 | Wt 238.0 lb

## 2012-12-27 DIAGNOSIS — G5600 Carpal tunnel syndrome, unspecified upper limb: Secondary | ICD-10-CM

## 2012-12-27 DIAGNOSIS — I781 Nevus, non-neoplastic: Secondary | ICD-10-CM

## 2012-12-27 DIAGNOSIS — G5602 Carpal tunnel syndrome, left upper limb: Secondary | ICD-10-CM | POA: Insufficient documentation

## 2012-12-27 DIAGNOSIS — I10 Essential (primary) hypertension: Secondary | ICD-10-CM

## 2012-12-27 LAB — BASIC METABOLIC PANEL WITH GFR
CO2: 29 mEq/L (ref 19–32)
Chloride: 101 mEq/L (ref 96–112)
Potassium: 4.4 mEq/L (ref 3.5–5.3)
Sodium: 138 mEq/L (ref 135–145)

## 2012-12-27 MED ORDER — OLMESARTAN MEDOXOMIL-HCTZ 40-12.5 MG PO TABS: 1.0000 | ORAL_TABLET | Freq: Every day | ORAL | Status: DC

## 2012-12-27 NOTE — Progress Notes (Signed)
CC: Binnie Vonderhaar is a 42 y.o. male is here for Hypertension   Subjective: HPI:  Pleasant former patient of Dr. Thurmond Butts  Followup hypertension: Taking daily Benicar HCT with no outside blood pressures to report. Denies headaches, vision changes, motor or sensory disturbances, chest pain, shortness of breath, orthopnea, nor irregular heartbeat  Patient complains of numbness in left hand, localized to index and middle finger, tip of fingers. Worse with typing and if lying on hand while sleeping. Improves with shaking hands. Mild in severity. Radiate somewhat up proximal arm. Denies weakness or pain in the extremity otherwise. No interventions as of yet. Present on a daily basis. Can occur any hour of the day.  Patient complains of a suspicious bump on his right scrotum. Former wife was not faithful and he is concerned of STI exposure. Denies testicular pain, penile discharge, penile lesions, groin pain or swelling, abdominal or pelvic pain. Denies fevers, chills, dysuria    Review Of Systems Outlined In HPI  Past Medical History  Diagnosis Date  . Hypertension   . ED (erectile dysfunction)      Family History  Problem Relation Age of Onset  . Hypertension Mother   . Thyroid disease Mother   . Hypertension Father      History  Substance Use Topics  . Smoking status: Never Smoker   . Smokeless tobacco: Not on file  . Alcohol Use:      Objective: Filed Vitals:   12/27/12 0925  BP: 119/84  Pulse: 81    General: Alert and Oriented, No Acute Distress HEENT: Pupils equal, round, reactive to light. Conjunctivae clear.  Moist mucous membranes Lungs: Clear to auscultation bilaterally, no wheezing/ronchi/rales.  Comfortable work of breathing. Good air movement. Cardiac: Regular rate and rhythm. Normal S1/S2.  No murmurs, rubs, nor gallops.   Genitourinary: Unremarkable penis anatomy, nontender testicles bilaterally, 2 mm dome-shaped red papule on the right scrotum  nontender Extremities: No peripheral edema.  Strong peripheral pulses.  Full range of motion strength in the left hand and fingers with positive Tinel tap negative Phalen's Mental Status: No depression, anxiety, nor agitation. Skin: Warm and dry.  Assessment & Plan: Geovonni was seen today for hypertension.  Diagnoses and associated orders for this visit:  Essential hypertension, benign - olmesartan-hydrochlorothiazide (BENICAR HCT) 40-12.5 MG per tablet; Take 1 tablet by mouth daily. - BASIC METABOLIC PANEL WITH GFR  Left carpal tunnel syndrome  Cherry angioma    Essential hypertension: Controlled, due for renal function and potassium obtained today continue Benicar HCT pending above results Left carpal tunnel syndrome: Given handout and demonstrated stretches to be done daily for 2-3 weeks if no improvement return for sports medicine evaluation/referral with Dr. Karie Schwalbe. Provided reassurance that Cherry angioma on left scrotum is not infectious in nature and of no clinical significance, patient relieved  Return in about 6 months (around 06/29/2013).

## 2013-01-08 ENCOUNTER — Encounter: Payer: Self-pay | Admitting: Family Medicine

## 2013-01-08 ENCOUNTER — Ambulatory Visit (INDEPENDENT_AMBULATORY_CARE_PROVIDER_SITE_OTHER): Payer: 59 | Admitting: Family Medicine

## 2013-01-08 VITALS — BP 127/84 | HR 97 | Wt 245.0 lb

## 2013-01-08 DIAGNOSIS — M533 Sacrococcygeal disorders, not elsewhere classified: Secondary | ICD-10-CM

## 2013-01-08 MED ORDER — MELOXICAM 15 MG PO TABS: 15.0000 mg | ORAL_TABLET | Freq: Every day | ORAL | Status: DC

## 2013-01-08 MED ORDER — CYCLOBENZAPRINE HCL 10 MG PO TABS: ORAL_TABLET | ORAL | Status: DC

## 2013-01-08 MED ORDER — HYDROCODONE-ACETAMINOPHEN 5-325 MG PO TABS: 1.0000 | ORAL_TABLET | Freq: Every evening | ORAL | Status: DC | PRN

## 2013-01-08 NOTE — Progress Notes (Signed)
CC: [name] is a 42 y.o. male is here for Back Injury   Subjective: HPI:  Patient complains of left low back pain present since Sunday night. He reports falling from a ladder Sunday morning broke his fall with his right leg but ended up falling on his buttocks. He denies immediate pain and was able to continue working on his house and even played a round of golf that afternoon. Sunday night he developed a soreness moderate in severity in the left lower back it is nonradiating. No interventions as of yet. Symptoms are present all hours of the day but absent when standing or lying down worse when bending forward or sitting. Pain is nonradiating, he denies saddle paresthesia, bowel or bladder incontinence, nor weakness in legs.  He's never had this before.  Denies fevers, chills, constipation, diarrhea, dysuria, pelvic pain nor genitourinary complaints.   Review Of Systems Outlined In HPI  Past Medical History  Diagnosis Date  . Hypertension   . ED (erectile dysfunction)      Family History  Problem Relation Age of Onset  . Hypertension Mother   . Thyroid disease Mother   . Hypertension Father      History  Substance Use Topics  . Smoking status: Never Smoker   . Smokeless tobacco: Not on file  . Alcohol Use:      Objective: Filed Vitals:   01/08/13 1439  BP: 127/84  Pulse: 97    General: Alert and Oriented, No Acute Distress HEENT: Pupils equal, round, reactive to light. Conjunctivae clear.  Moist mucous membranes Lungs: Clear to auscultation bilaterally, no wheezing/ronchi/rales.  Comfortable work of breathing. Good air movement. Cardiac: Regular rate and rhythm. Normal S1/S2.  No murmurs, rubs, nor gallops.   Abdomen:  soft and non tender without palpable masses. Extremities: No peripheral edema.  Strong peripheral pulses. L4 and S1 DTRs two over four bilaterally, full range of motion and strength of both lower extremities. Long roll negative bilaterally, straight leg  raise negative. FABER negative bilaterally positive FADIR on left.  Back: No midline spinous process tenderness, pain is reproduced with palpation of the left sacroiliac joint. Stork test negative, flexion at the waist restricted to 45 Mental Status: No depression, anxiety, nor agitation. Skin: Warm and dry.  Assessment & Plan: Albert Hogan was seen today for back injury.  Diagnoses and associated orders for this visit:  Sacroiliac pain - cyclobenzaprine (FLEXERIL) 10 MG tablet; Take a half to a full tab every 8-12 hours only as needed for muscle spasm, may cause sedation. - HYDROcodone-acetaminophen (NORCO/VICODIN) 5-325 MG per tablet; Take 1 tablet by mouth at bedtime as needed for pain. - meloxicam (MOBIC) 15 MG tablet; Take 1 tablet (15 mg total) by mouth daily.    Discussed with patient low suspicion of fracture however I do feel that he strained and sprained the musculature and ligaments surrounding his left sacroiliac joint. Discussed relative rest working on range of motion daily nonsteroidal anti-inflammatory, and occasional Flexeril and nighttime Vicodin as needed. Expect significant improvement over the next 2 weeks.  Return in about 4 weeks (around 02/05/2013), or if symptoms worsen or fail to improve.

## 2013-01-16 ENCOUNTER — Telehealth: Payer: Self-pay | Admitting: *Deleted

## 2013-01-16 NOTE — Telephone Encounter (Signed)
Pt calls and states that you gave him a out of work note to return this Thursday. He wants the work note extended for 2 more weeks because he doesn't feel like he should be lifting anything yet.

## 2013-01-16 NOTE — Telephone Encounter (Signed)
Pt notified and note left up front

## 2013-01-16 NOTE — Telephone Encounter (Signed)
Sue Lush, Note printed and placed in your inbox ready for pickup/fax.

## 2013-03-27 ENCOUNTER — Ambulatory Visit (INDEPENDENT_AMBULATORY_CARE_PROVIDER_SITE_OTHER): Payer: No Typology Code available for payment source | Admitting: Family Medicine

## 2013-03-27 ENCOUNTER — Encounter: Payer: Self-pay | Admitting: Family Medicine

## 2013-03-27 VITALS — BP 146/91 | HR 96 | Wt 226.0 lb

## 2013-03-27 DIAGNOSIS — J329 Chronic sinusitis, unspecified: Secondary | ICD-10-CM

## 2013-03-27 MED ORDER — AMOXICILLIN-POT CLAVULANATE 500-125 MG PO TABS: ORAL_TABLET | ORAL | Status: AC

## 2013-03-27 NOTE — Progress Notes (Signed)
CC: Albert Hogan is a 42 y.o. male is here for Sinusitis   Subjective: HPI:  Patient went to 2 days of moderate nasal congestion and moderate facial pressure localized below the eyes laterally. Both of which are worse when lying down flat symptoms are slightly greater than mild all other times. Symptoms came on abruptly has not been getting better or worsens onset. Nasal discharge is described as persistent thick green discolored. He has started Flonase but no other intervention. Has had subjective fevers and chills last night prompting today's visit. Mild nonproductive cough. Symptoms are all present all hours of the day mildly interfere with sleep. Denies motor or sensory disturbances, confusion, chest pain, shortness of breath, wheezing   Review Of Systems Outlined In HPI  Past Medical History  Diagnosis Date  . Hypertension   . ED (erectile dysfunction)      Family History  Problem Relation Age of Onset  . Hypertension Mother   . Thyroid disease Mother   . Hypertension Father      History  Substance Use Topics  . Smoking status: Never Smoker   . Smokeless tobacco: Not on file  . Alcohol Use:      Objective: Filed Vitals:   03/27/13 1323  BP: 146/91  Pulse: 96    General: Alert and Oriented, No Acute Distress HEENT: Pupils equal, round, reactive to light. Conjunctivae clear.  External ears unremarkable, canals clear with intact TMs with appropriate landmarks.  Middle ear appears open without effusion. Boggy erythematous inferior turbinates bilaterally.  Moist mucous membranes, pharynx without inflammation nor lesions however moderate cobblestoning.  Neck supple without palpable lymphadenopathy nor abnormal masses. Maxillary sinus tenderness bilaterally Lungs: Clear to auscultation bilaterally, no wheezing/ronchi/rales.  Comfortable work of breathing. Good air movement. Cardiac: Regular rate and rhythm. Normal S1/S2.  No murmurs, rubs, nor gallops.   Skin: Warm and  dry.  Assessment & Plan: Albert Hogan was seen today for sinusitis.  Diagnoses and associated orders for this visit:  Sinusitis - amoxicillin-clavulanate (AUGMENTIN) 500-125 MG per tablet; Take one by mouth every 8 hours for ten total days.    Discussed with patient probability would point to viral etiology of his symptoms I encouraged him to continue Flonase consider Alka-Seltzer cold and sinus avoid pseudoephedrine for the next 5 days and if symptoms persist or rebound start Augmentin otherwise this should be self resolving after 7 days of symptoms  Return if symptoms worsen or fail to improve.

## 2013-05-15 ENCOUNTER — Encounter: Payer: Self-pay | Admitting: Family Medicine

## 2013-05-15 ENCOUNTER — Ambulatory Visit (INDEPENDENT_AMBULATORY_CARE_PROVIDER_SITE_OTHER): Payer: No Typology Code available for payment source | Admitting: Family Medicine

## 2013-05-15 ENCOUNTER — Ambulatory Visit (INDEPENDENT_AMBULATORY_CARE_PROVIDER_SITE_OTHER): Payer: No Typology Code available for payment source

## 2013-05-15 VITALS — BP 114/81 | HR 89 | Temp 97.1°F | Wt 246.0 lb

## 2013-05-15 DIAGNOSIS — M545 Low back pain: Secondary | ICD-10-CM

## 2013-05-15 DIAGNOSIS — M533 Sacrococcygeal disorders, not elsewhere classified: Secondary | ICD-10-CM

## 2013-05-15 DIAGNOSIS — M5136 Other intervertebral disc degeneration, lumbar region: Secondary | ICD-10-CM | POA: Insufficient documentation

## 2013-05-15 DIAGNOSIS — R109 Unspecified abdominal pain: Secondary | ICD-10-CM

## 2013-05-15 LAB — POCT URINALYSIS DIPSTICK
Bilirubin, UA: NEGATIVE
Blood, UA: NEGATIVE
Glucose, UA: NEGATIVE
Spec Grav, UA: >=1.03
pH, UA: 5.5

## 2013-05-15 MED ORDER — CELECOXIB 200 MG PO CAPS: 200.0000 mg | ORAL_CAPSULE | Freq: Every day | ORAL | Status: AC

## 2013-05-15 NOTE — Progress Notes (Signed)
CC: Albert Hogan is a 42 y.o. male is here for lower abd pain   Subjective: HPI:  Patient complains of low, pain that has been present for the last week is described as mild and only has pain it is nonradiating it is localized to the left lower quadrant. It is worse with any rotational maneuvers or when going from a seated to standing position. It is not influenced by Valsalva maneuver. It is slightly reduced if pressing on his left lower quadrant while doing the above movements he states the pain is similar to that he was experiencing when he had a hernia years ago however during this presentation he is unable to palpate any mass or swelling near the location of the pain. He denies any testicular pain or swelling, groin pain, diarrhea, constipation, nausea, vomiting, fevers, chills nor dysuria.  Patient states that left low back pain that he experienced after falling off a ladder in the spring has not subsided. It is still mild present on a daily basis worse when extending backwards nothing else particularly makes it better or worse. He denies midline lumbar pain nor saddle paresthesia nor bowel or bladder incontinence nor weakness in the legs. His pain is nonradiating.   Review Of Systems Outlined In HPI  Past Medical History  Diagnosis Date  . Hypertension   . ED (erectile dysfunction)      Family History  Problem Relation Age of Onset  . Hypertension Mother   . Thyroid disease Mother   . Hypertension Father      History  Substance Use Topics  . Smoking status: Never Smoker   . Smokeless tobacco: Not on file  . Alcohol Use:      Objective: Filed Vitals:   05/15/13 1003  BP: 114/81  Pulse: 89  Temp: 97.1 F (36.2 C)    General: Alert and Oriented, No Acute Distress HEENT: Pupils equal, round, reactive to light. Conjunctivae clear. Moist mucous membranes  Lungs: Clear to auscultation bilaterally, no wheezing/ronchi/rales.  Comfortable work of breathing. Good air  movement. Cardiac: Regular rate and rhythm. Abdomen: Normal bowel sounds, soft without guarding no rebound. Presenting pain is reproduced with deep palpation of the right lower quadrant and left lower quadrant.there are no palpable masses nor hernias  Genitourinary: There are no palpable masses or hernias at rest or with Valsalva when pressing in the inguinal canal nor near the testicles  Back: Straight leg raise is negative, FABER/FADIR negative, log roll negative, no midline spinous process tenderness in the lumbar region, back pain is reproduced with palpation of left sacroiliac joint, and strongly reproduced with stork test bilaterally  Extremities: No peripheral edema.  Strong peripheral pulses. Presenting abdominal pain is reproduced with resistance of left psoas muscle activation  Mental Status: No depression, anxiety, nor agitation. Skin: Warm and dry.  Assessment & Plan: Albert Hogan was seen today for lower abd pain.  Diagnoses and associated orders for this visit:  Abdominal  pain, other specified site - Urinalysis Dipstick  Low back pain - DG Lumbar Spine Complete; Future  Sacroiliac pain - DG Sacrum/Coccyx; Future  Other Orders - celecoxib (CELEBREX) 200 MG capsule; Take 1 capsule (200 mg total) by mouth daily.    Discussed with patient my suspicion of a iliopsoas strain causing his abdominal pain low suspicion for hernia or gastrointestinal etiology of pain, he does not have a appendix. Handout given on rehabilitation along with resistant bands to be performed daily for the next 2 weeks For his low back  pain will continue evaluation with sacroiliac images along with lumbar spine series given failure to improve on conservative therapy over the summer, but it was not much help we will try Celebrex.  Return if symptoms worsen or fail to improve.

## 2013-05-17 ENCOUNTER — Telehealth: Payer: Self-pay | Admitting: Family Medicine

## 2013-05-17 DIAGNOSIS — M519 Unspecified thoracic, thoracolumbar and lumbosacral intervertebral disc disorder: Secondary | ICD-10-CM

## 2013-05-17 DIAGNOSIS — M545 Low back pain: Secondary | ICD-10-CM

## 2013-05-17 NOTE — Telephone Encounter (Signed)
PT placed.

## 2013-05-22 ENCOUNTER — Encounter (INDEPENDENT_AMBULATORY_CARE_PROVIDER_SITE_OTHER): Payer: Self-pay

## 2013-05-22 ENCOUNTER — Ambulatory Visit (INDEPENDENT_AMBULATORY_CARE_PROVIDER_SITE_OTHER): Payer: No Typology Code available for payment source | Admitting: Physical Therapy

## 2013-05-22 DIAGNOSIS — M545 Low back pain, unspecified: Secondary | ICD-10-CM

## 2013-05-22 DIAGNOSIS — M2550 Pain in unspecified joint: Secondary | ICD-10-CM

## 2013-05-22 DIAGNOSIS — M2560 Stiffness of unspecified joint, not elsewhere classified: Secondary | ICD-10-CM

## 2013-05-22 DIAGNOSIS — M519 Unspecified thoracic, thoracolumbar and lumbosacral intervertebral disc disorder: Secondary | ICD-10-CM

## 2013-05-27 ENCOUNTER — Encounter: Payer: Self-pay | Admitting: Family Medicine

## 2013-05-27 ENCOUNTER — Encounter: Payer: No Typology Code available for payment source | Admitting: Physical Therapy

## 2013-05-27 ENCOUNTER — Encounter (INDEPENDENT_AMBULATORY_CARE_PROVIDER_SITE_OTHER): Payer: Self-pay

## 2013-05-27 DIAGNOSIS — M545 Low back pain: Secondary | ICD-10-CM

## 2013-05-27 DIAGNOSIS — M2560 Stiffness of unspecified joint, not elsewhere classified: Secondary | ICD-10-CM

## 2013-05-27 DIAGNOSIS — M2550 Pain in unspecified joint: Secondary | ICD-10-CM

## 2013-05-27 DIAGNOSIS — M519 Unspecified thoracic, thoracolumbar and lumbosacral intervertebral disc disorder: Secondary | ICD-10-CM

## 2013-05-30 DIAGNOSIS — M2550 Pain in unspecified joint: Secondary | ICD-10-CM

## 2013-05-30 DIAGNOSIS — M519 Unspecified thoracic, thoracolumbar and lumbosacral intervertebral disc disorder: Secondary | ICD-10-CM

## 2013-05-30 DIAGNOSIS — M2560 Stiffness of unspecified joint, not elsewhere classified: Secondary | ICD-10-CM

## 2013-05-30 DIAGNOSIS — M545 Low back pain: Secondary | ICD-10-CM

## 2013-06-03 ENCOUNTER — Encounter: Payer: No Typology Code available for payment source | Admitting: Physical Therapy

## 2013-06-03 DIAGNOSIS — M545 Low back pain: Secondary | ICD-10-CM

## 2013-06-03 DIAGNOSIS — M519 Unspecified thoracic, thoracolumbar and lumbosacral intervertebral disc disorder: Secondary | ICD-10-CM

## 2013-06-03 DIAGNOSIS — M2550 Pain in unspecified joint: Secondary | ICD-10-CM

## 2013-06-03 DIAGNOSIS — M2560 Stiffness of unspecified joint, not elsewhere classified: Secondary | ICD-10-CM

## 2013-06-06 ENCOUNTER — Encounter: Payer: No Typology Code available for payment source | Admitting: Physical Therapy

## 2013-06-06 DIAGNOSIS — M519 Unspecified thoracic, thoracolumbar and lumbosacral intervertebral disc disorder: Secondary | ICD-10-CM

## 2013-06-06 DIAGNOSIS — M545 Low back pain: Secondary | ICD-10-CM

## 2013-06-06 DIAGNOSIS — M2560 Stiffness of unspecified joint, not elsewhere classified: Secondary | ICD-10-CM

## 2013-06-20 DIAGNOSIS — M519 Unspecified thoracic, thoracolumbar and lumbosacral intervertebral disc disorder: Secondary | ICD-10-CM

## 2013-06-20 DIAGNOSIS — M2550 Pain in unspecified joint: Secondary | ICD-10-CM

## 2013-06-20 DIAGNOSIS — M2560 Stiffness of unspecified joint, not elsewhere classified: Secondary | ICD-10-CM

## 2013-06-20 DIAGNOSIS — M545 Low back pain: Secondary | ICD-10-CM

## 2013-06-24 ENCOUNTER — Encounter: Payer: No Typology Code available for payment source | Admitting: Physical Therapy

## 2013-06-28 ENCOUNTER — Encounter: Payer: No Typology Code available for payment source | Admitting: Physical Therapy

## 2013-06-28 DIAGNOSIS — M2560 Stiffness of unspecified joint, not elsewhere classified: Secondary | ICD-10-CM

## 2013-06-28 DIAGNOSIS — M2550 Pain in unspecified joint: Secondary | ICD-10-CM

## 2013-06-28 DIAGNOSIS — M545 Low back pain: Secondary | ICD-10-CM

## 2013-08-09 ENCOUNTER — Other Ambulatory Visit: Payer: Self-pay | Admitting: *Deleted

## 2013-08-09 DIAGNOSIS — N529 Male erectile dysfunction, unspecified: Secondary | ICD-10-CM

## 2013-08-09 MED ORDER — TADALAFIL 20 MG PO TABS: 20.0000 mg | ORAL_TABLET | Freq: Every day | ORAL | Status: DC | PRN

## 2013-09-17 ENCOUNTER — Encounter: Payer: Self-pay | Admitting: Family Medicine

## 2013-09-17 ENCOUNTER — Ambulatory Visit (INDEPENDENT_AMBULATORY_CARE_PROVIDER_SITE_OTHER): Payer: No Typology Code available for payment source | Admitting: Family Medicine

## 2013-09-17 VITALS — BP 145/96 | HR 76 | Ht 73.0 in | Wt 245.0 lb

## 2013-09-17 DIAGNOSIS — I10 Essential (primary) hypertension: Secondary | ICD-10-CM

## 2013-09-17 DIAGNOSIS — Z202 Contact with and (suspected) exposure to infections with a predominantly sexual mode of transmission: Secondary | ICD-10-CM

## 2013-09-17 DIAGNOSIS — I781 Nevus, non-neoplastic: Secondary | ICD-10-CM

## 2013-09-17 DIAGNOSIS — Z131 Encounter for screening for diabetes mellitus: Secondary | ICD-10-CM

## 2013-09-17 DIAGNOSIS — Z13220 Encounter for screening for lipoid disorders: Secondary | ICD-10-CM

## 2013-09-17 DIAGNOSIS — D1801 Hemangioma of skin and subcutaneous tissue: Secondary | ICD-10-CM

## 2013-09-17 DIAGNOSIS — R1031 Right lower quadrant pain: Secondary | ICD-10-CM

## 2013-09-17 DIAGNOSIS — N509 Disorder of male genital organs, unspecified: Secondary | ICD-10-CM

## 2013-09-17 DIAGNOSIS — Z0000 Encounter for general adult medical examination without abnormal findings: Secondary | ICD-10-CM

## 2013-09-17 MED ORDER — METRONIDAZOLE 500 MG PO TABS: ORAL_TABLET | ORAL | Status: DC

## 2013-09-17 NOTE — Assessment & Plan Note (Signed)
Patient requests intervention, since this is on the scrotum will send to dermatology. Discussed this might not be covered by insurance.

## 2013-09-17 NOTE — Progress Notes (Signed)
CC: Albert Hogan is a 43 y.o. male is here for Annual Exam   Subjective: HPI:  Patient presents for annual exam with his only acute complaint being exposure to trichomoniasis a little over a week ago. He denies any dysuria or any penile discharge. He would like to know if it would be wise to be screened for additional STDs and if he needs to be treated for trichomoniasis.  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 at prior visit  Review of Systems - General ROS: negative for - chills, fever, night sweats, weight gain or weight loss Ophthalmic ROS: negative for - decreased vision Psychological ROS: negative for - anxiety or depression ENT ROS: negative for - hearing change, nasal congestion, tinnitus or allergies Hematological and Lymphatic ROS: negative for - bleeding problems, bruising or swollen lymph nodes Breast ROS: negative Respiratory ROS: no cough, shortness of breath, or wheezing Cardiovascular ROS: no chest pain or dyspnea on exertion Gastrointestinal ROS: no abdominal pain, change in bowel habits, or black or bloody stools Genito-Urinary ROS: negative for - genital discharge, genital ulcers, incontinence or abnormal bleeding from genitals Musculoskeletal ROS: negative for - joint pain or muscle pain Neurological ROS: negative for - headaches or memory loss Dermatological ROS: negative for lumps, mole changes, rash and skin lesion changes  Past Medical History  Diagnosis Date  . Hypertension   . ED (erectile dysfunction)     Past Surgical History  Procedure Laterality Date  . Hernia repair    . Appendectomy  2001   Family History  Problem Relation Age of Onset  . Hypertension Mother   . Thyroid disease Mother   . Hypertension Father     History   Social History  . Marital Status: Single    Spouse Name: N/A    Number of Children: N/A  . Years of Education: N/A    Occupational History  . Not on file.   Social History Main Topics  . Smoking status: Never Smoker   . Smokeless tobacco: Not on file  . Alcohol Use:   . Drug Use: No  . Sexual Activity:    Other Topics Concern  . Not on file   Social History Narrative  . No narrative on file     Objective: BP 145/96  Pulse 76  Ht 6\' 1"  (1.854 m)  Wt 245 lb (111.131 kg)  BMI 32.33 kg/m2  General: No Acute Distress HEENT: Atraumatic, normocephalic, conjunctivae normal without scleral icterus.  No nasal discharge, hearing grossly intact, TMs with good landmarks bilaterally with no middle ear abnormalities, posterior pharynx clear without oral lesions. Neck: Supple, trachea midline, no cervical nor supraclavicular adenopathy. Pulmonary: Clear to auscultation bilaterally without wheezing, rhonchi, nor rales. Cardiac: Regular rate and rhythm.  No murmurs, rubs, nor gallops. No peripheral edema.  2+ peripheral pulses bilaterally. Abdomen: Bowel sounds normal.  No masses. Negative Murphy's sign he has mild to moderate right lower quadrant tenderness with deep palpation however no guarding rebound or tenderness elsewhere in the abdomen GU: Bilateral descended testes without inguinal hernia MSK: Grossly intact, no signs of weakness.  Full strength throughout upper and lower extremities.  Full ROM in upper and lower extremities.  No midline spinal tenderness. Neuro: Gait unremarkable, CN II-XII grossly intact.  C5-C6 Reflex 2/4 Bilaterally, L4 Reflex 2/4 Bilaterally.  Cerebellar function intact. Skin: No rashes. Psych: Alert and oriented to person/place/time.  Thought process  normal. No anxiety/depression.  Assessment & Plan: Problem List Items Addressed This Visit   Essential hypertension, benign (Chronic)   Annual physical exam - Primary     Colonoscopy: No family hx, will begin screening age 43 Prostate: Discussed screening risks/beneifts with patient on 09/17/2013. No family hx, will begin  screening age 36. Influenza Vaccine: declines 09/17/2013 Pneumovax: no indication Td/Tdap: believes it's been less than 10 years  Healthy lifestyle interventions including but limited to regular exercise, a healthy low fat diet, moderation of salt intake, the dangers of tobacco/alcohol/recreational drug use, nutrition supplementation, and accident avoidance were discussed with the patient and a handout was provided for future reference. Due for routine dyslipidemia and diabetic screening.    Cherry angioma     Patient requests intervention, since this is on the scrotum will send to dermatology. Discussed this might not be covered by insurance.    Exposure to STD     Start single dose flagyl due to known exposure, screening for additional infectious diseases.    Relevant Medications      metroNIDAZOLE (FLAGYL) tablet   Other Relevant Orders      HIV antibody (Completed)      GC/chlamydia probe amp, urine (Completed)      Hepatitis C Antibody (Completed)      RPR (Completed)   RLQ abdominal pain     Stable, encouraged to f/u in 1-2 months to review symptom diary to determine if imaging is warranted.     Other Visit Diagnoses   Lipid screening        Relevant Orders       Lipid panel (Completed)    Diabetes mellitus screening        Relevant Orders       BASIC METABOLIC PANEL WITH GFR (Completed)    Skin lesion of scrotum        Relevant Orders       Ambulatory referral to Dermatology      Return in about 2 months (around 11/15/2013) for Abdominal Pain Review.

## 2013-09-17 NOTE — Patient Instructions (Signed)
Please keep a diary of your right lower quadrant pain symptoms and return in March or April of this year for determination if we need imaging of your abdomen.  Call me if you've not gotten a dermatology referral call within the next week  Take a one-time dose of metronidazole that I sent to [facility].  Dr. Lajoyce Lauber General Advice Following Your Complete Physical Exam  The Benefits of Regular Exercise: Unless you suffer from an uncontrolled cardiovascular condition, studies strongly suggest that regular exercise and physical activity will add to both the quality and length of your life.  The World Health Organization recommends 150 minutes of moderate intensity aerobic activity every week.  This is best split over 3-4 days a week, and can be as simple as a brisk walk for just over 35 minutes "most days of the week".  This type of exercise has been shown to lower LDL-Cholesterol, lower average blood sugars, lower blood pressure, lower cardiovascular disease risk, improve memory, and increase one's overall sense of wellbeing.  The addition of anaerobic (or "strength training") exercises offers additional benefits including but not limited to increased metabolism, prevention of osteoporosis, and improved overall cholesterol levels.  How Can I Strive For A Low-Fat Diet?: Current guidelines recommend that 25-35 percent of your daily energy (food) intake should come from fats.  One might ask how can this be achieved without having to dissect each meal on a daily basis?  Switch to skim or 1% milk instead of whole milk.  Focus on lean meats such as ground [place], fresh fish, baked chicken, and lean cuts of beef as your source of dietary protein.  Consume less than 300mg /day of dietary cholesterol.  Limit trans fatty acid consumption primarily by limiting synthetic trans fats such as partially hydrogenated oils (Ex: fried fast foods).  Focus efforts on reducing your intake of "solid" fats (Ex:  Butter).  Substitute olive or vegetable oil for solid fats where possible.  Moderation of Salt Intake: Provided you don't carry a diagnosis of congestive heart failure nor renal failure, I recommend a daily allowance of no more than 2300 mg of salt (sodium).  Keeping under this daily goal is associated with a decreased risk of cardiovascular events, creeping above it can lead to elevated blood pressures and increases your risk of cardiovascular events.  Milligrams (mg) of salt is listed on all nutrition labels, and your daily intake can add up faster than you think.  Most canned and frozen dinners can pack in over half your daily salt allowance in one meal.    Lifestyle Health Risks: Certain lifestyle choices carry specific health risks.  As you may already know, tobacco use has been associated with increasing one's risk of cardiovascular disease, pulmonary disease, numerous cancers, among many other issues.  What you may not know is that there are medications and nicotine replacement strategies that can more than double your chances of successfully quitting.  I would be thrilled to help manage your quitting strategy if you currently use tobacco products.  When it comes to alcohol use, I've yet to find an "ideal" daily allowance.  Provided an individual does not have a medical condition that is exacerbated by alcohol consumption, general guidelines determine "safe drinking" as no more than two standard drinks for a man or no more than one standard drink for a male per day.  However, much debate still exists on whether any amount of alcohol consumption is technically "safe".  My general advice, keep alcohol consumption to  a minimum for general health promotion.  If you or others believe that alcohol, tobacco, or recreational drug use is interfering with your life, I would be happy to provide confidential counseling regarding treatment options.  General "Over The Counter" Nutrition Advice: Postmenopausal  women should aim for a daily calcium intake of 1200 mg, however a significant portion of this might already be provided by diets including milk, yogurt, cheese, and other dairy products.  Vitamin D has been shown to help preserve bone density, prevent fatigue, and has even been shown to help reduce falls in the elderly.  Ensuring a daily intake of 800 Units of Vitamin D is a good place to start to enjoy the above benefits, we can easily check your Vitamin D level to see if you'd potentially benefit from supplementation beyond 800 Units a day.  Folic Acid intake should be of particular concern to women of childbearing age.  Daily consumption of 093-267 mcg of Folic Acid is recommended to minimize the chance of spinal cord defects in a fetus should pregnancy occur.    For many adults, accidents still remain one of the most common culprits when it comes to cause of death.  Some of the simplest but most effective preventitive habits you can adopt include regular seatbelt use, proper helmet use, securing firearms, and regularly testing your smoke and carbon monoxide detectors.  Shyne Resch [address]. [facility] [address] [place], [place], [place] [address] Phone: [phone]   Testicular Self-Exam A self-examination of your testicles involves looking at and feeling your testicles for abnormal lumps or swelling. Several things can cause swelling, lumps, or pain in your testicles. Some of these causes are:  Injuries.  Inflammation.  Infection.  Accumulation of fluids around your testicle (hydrocele).  Twisted testicles (testicular torsion).  Testicular cancer. Self-examination of the testicles and groin areas may be advised if you are at risk for testicular cancer. Risks for testicular cancer include:  An undescended testicle (cryptorchidism).  A history of previous testicular cancer.  A family history of testicular cancer. The testicles are easiest to examine after warm baths  or showers and are more difficult to examine when you are cold. This is because the muscles attached to the testicles retract and pull them up higher or into the abdomen. Follow these steps while you are standing:  Hold your penis away from your body.  Roll one testicle between your thumb and forefinger, feeling the entire testicle.  Roll the other testicle between your thumb and forefinger, feeling the entire testicle. Feel for lumps, swelling, or discomfort. A normal testicle is egg shaped and feels firm. It is smooth and not tender. The spermatic cord can be felt as a firm spaghetti-like cord at the back of your testicle. It is also important to examine the crease between the front of your leg and your abdomen. Feel for any bumps that are tender. These could be enlarged lymph nodes.  Document Released: 11/07/2000 Document Revised: 04/03/2013 Document Reviewed: 01/21/2013 [facility] Patient Information 2014 [place], [place].

## 2013-09-17 NOTE — Assessment & Plan Note (Signed)
Start single dose flagyl due to known exposure, screening for additional infectious diseases.

## 2013-09-17 NOTE — Progress Notes (Deleted)
CC: Albert Hogan is a 43 y.o. male is here for Annual Exam   Subjective: HPI:  Colonoscopy: ([Average: Start 43 yo, Stop at 61 or when life expectancy < 5 years], [High Risk: First Deg Relative Hx < 68yo: Start at 74 or 10 years prior to youngest diagnosis - whichever first]) Prostate: Discussed screening risks/beneifts with patient on ____. No PSA/DRE vs old recs of ---> (Start 42 yo, Start 40-45 if AA/Fam Hx/BRCA1-2, PSA +/- DRE 2-4 years)   Influenza Vaccine:  Pneumovax:  Td/Tdap:  Zoster: (Start 42 yo)     Review Of Systems Outlined In HPI  Past Medical History  Diagnosis Date  . Hypertension   . ED (erectile dysfunction)      Family History  Problem Relation Age of Onset  . Hypertension Mother   . Thyroid disease Mother   . Hypertension Father      History  Substance Use Topics  . Smoking status: Never Smoker   . Smokeless tobacco: Not on file  . Alcohol Use:      Objective: Filed Vitals:   09/17/13 1059  BP: 145/96  Pulse: 76    General: Alert and Oriented, No Acute Distress HEENT: Pupils equal, round, reactive to light. Conjunctivae clear.  External ears unremarkable, canals clear with intact TMs with appropriate landmarks.  Middle ear appears open without effusion. Pink inferior turbinates.  Moist mucous membranes, pharynx without inflammation nor lesions.  Neck supple without palpable lymphadenopathy nor abnormal masses. Lungs: Clear to auscultation bilaterally, no wheezing/ronchi/rales.  Comfortable work of breathing. Good air movement. Cardiac: Regular rate and rhythm. Normal S1/S2.  No murmurs, rubs, nor gallops.   Abdomen: Normal bowel sounds, soft and non tender without palpable masses. Extremities: No peripheral edema.  Strong peripheral pulses.  Mental Status: No depression, anxiety, nor agitation. Skin: Warm and dry.  Assessment & Plan: Albert Hogan was seen today for annual exam.  Diagnoses and associated orders for this visit:  Annual  physical exam  Lipid screening  Diabetes mellitus screening     No Follow-up on file.

## 2013-09-17 NOTE — Assessment & Plan Note (Addendum)
Colonoscopy: No family hx, will begin screening age 43 Prostate: Discussed screening risks/beneifts with patient on 09/17/2013. No family hx, will begin screening age 63. Influenza Vaccine: declines 09/17/2013 Pneumovax: no indication Td/Tdap: believes it's been less than 10 years  Healthy lifestyle interventions including but limited to regular exercise, a healthy low fat diet, moderation of salt intake, the dangers of tobacco/alcohol/recreational drug use, nutrition supplementation, and accident avoidance were discussed with the patient and a handout was provided for future reference. Due for routine dyslipidemia and diabetic screening.

## 2013-09-17 NOTE — Assessment & Plan Note (Signed)
Stable, encouraged to f/u in 1-2 months to review symptom diary to determine if imaging is warranted.

## 2013-09-18 ENCOUNTER — Encounter: Payer: Self-pay | Admitting: Family Medicine

## 2013-09-18 DIAGNOSIS — E785 Hyperlipidemia, unspecified: Secondary | ICD-10-CM | POA: Insufficient documentation

## 2013-09-18 LAB — BASIC METABOLIC PANEL WITH GFR
BUN: 21 mg/dL (ref 6–23)
CHLORIDE: 101 meq/L (ref 96–112)
CO2: 25 meq/L (ref 19–32)
Calcium: 9.6 mg/dL (ref 8.4–10.5)
Creat: 1.25 mg/dL (ref 0.50–1.35)
GFR, Est African American: 82 mL/min
GFR, Est Non African American: 71 mL/min
GLUCOSE: 85 mg/dL (ref 70–99)
Potassium: 4.4 mEq/L (ref 3.5–5.3)
SODIUM: 140 meq/L (ref 135–145)

## 2013-09-18 LAB — LIPID PANEL
Cholesterol: 190 mg/dL (ref 0–200)
HDL: 52 mg/dL (ref >39)
LDL Cholesterol: 127 mg/dL — ABNORMAL HIGH (ref 0–99)
Total CHOL/HDL Ratio: 3.7 Ratio
Triglycerides: 56 mg/dL (ref <150)
VLDL: 11 mg/dL (ref 0–40)

## 2013-09-18 LAB — GC/CHLAMYDIA PROBE AMP, URINE
CHLAMYDIA, SWAB/URINE, PCR: NEGATIVE
GC PROBE AMP, URINE: NEGATIVE

## 2013-09-18 LAB — HIV ANTIBODY (ROUTINE TESTING W REFLEX): HIV: NONREACTIVE

## 2013-09-18 LAB — RPR

## 2013-09-18 LAB — HEPATITIS C ANTIBODY: HCV AB: NEGATIVE

## 2013-10-24 ENCOUNTER — Encounter: Payer: Self-pay | Admitting: Family Medicine

## 2013-10-24 DIAGNOSIS — N5089 Other specified disorders of the male genital organs: Secondary | ICD-10-CM | POA: Insufficient documentation

## 2013-11-28 ENCOUNTER — Other Ambulatory Visit: Payer: Self-pay | Admitting: *Deleted

## 2013-11-28 DIAGNOSIS — I10 Essential (primary) hypertension: Secondary | ICD-10-CM

## 2013-11-28 MED ORDER — OLMESARTAN MEDOXOMIL-HCTZ 40-12.5 MG PO TABS: 1.0000 | ORAL_TABLET | Freq: Every day | ORAL | Status: DC

## 2014-07-09 ENCOUNTER — Encounter: Payer: Self-pay | Admitting: Family Medicine

## 2014-07-09 ENCOUNTER — Ambulatory Visit (INDEPENDENT_AMBULATORY_CARE_PROVIDER_SITE_OTHER): Payer: No Typology Code available for payment source | Admitting: Family Medicine

## 2014-07-09 VITALS — BP 125/86 | HR 85 | Ht 73.0 in | Wt 250.8 lb

## 2014-07-09 DIAGNOSIS — S46111A Strain of muscle, fascia and tendon of long head of biceps, right arm, initial encounter: Secondary | ICD-10-CM

## 2014-07-09 MED ORDER — HYDROCODONE-ACETAMINOPHEN 10-325 MG PO TABS: 1.0000 | ORAL_TABLET | Freq: Three times a day (TID) | ORAL | Status: DC | PRN

## 2014-07-09 MED ORDER — MELOXICAM 15 MG PO TABS: 15.0000 mg | ORAL_TABLET | Freq: Every day | ORAL | Status: DC

## 2014-07-13 NOTE — Progress Notes (Signed)
CC: Albert Hogan is a 43 y.o. male is here for Shoulder Pain   Subjective: HPI:  Complains of right shoulder pain that came on suddenly while he was lifting a piece of furniture at chest level. Pain is localized in the right shoulder and nonradiating. It is worse with any quick movement of the right shoulder in any position. It is improved with not moving the arm. It is also worse with rolling over on the right arm. No particular position is more uncomfortable than others. describes the pain only has pain moderate to severe in severity. No interventions as of yet. Symptoms began on Monday of this week. He denies any motor or sensory disturbances of the right upper extremity. Denies weakness of the right upper shoulder. Denies any neck pain or back pain.   Review Of Systems Outlined In HPI  Past Medical History  Diagnosis Date  . Hypertension   . ED (erectile dysfunction)     Past Surgical History  Procedure Laterality Date  . Hernia repair    . Appendectomy  2001   Family History  Problem Relation Age of Onset  . Hypertension Mother   . Thyroid disease Mother   . Hypertension Father     History   Social History  . Marital Status: Single    Spouse Name: N/A    Number of Children: N/A  . Years of Education: N/A   Occupational History  . Not on file.   Social History Main Topics  . Smoking status: Never Smoker   . Smokeless tobacco: Not on file  . Alcohol Use: Not on file  . Drug Use: No  . Sexual Activity: Not on file   Other Topics Concern  . Not on file   Social History Narrative     Objective: BP 125/86 mmHg  Pulse 85  Ht 6\' 1"  (1.854 m)  Wt 250 lb 12 oz (113.739 kg)  BMI 33.09 kg/m2  General: Alert and Oriented, No Acute Distress HEENT: Pupils equal, round, reactive to light. Conjunctivae clear.   Lungs: Clear to auscultation bilaterally, no wheezing/ronchi/rales.  Comfortable work of breathing. Good air movement. Cardiac: Regular rate and rhythm.  Normal S1/S2.  No murmurs, rubs, nor gallops.   Right shoulder exam reveals full range of motion and strength in all planes of motion and with individual rotator cuff testing. No overlying redness warmth or swelling.  Neer's test negative.  Hawkins test negative. Empty can negative. Crossarm test negative. O'Brien's test negative. Apprehension test negative. Speed's test positive. Extremities: No peripheral edema.  Strong peripheral pulses.  Mental Status: No depression, anxiety, nor agitation. Skin: Warm and dry.  Assessment & Plan: Claire was seen today for shoulder pain.  Diagnoses and associated orders for this visit:  Biceps tendon tear, right, initial encounter - meloxicam (MOBIC) 15 MG tablet; Take 1 tablet (15 mg total) by mouth daily. - HYDROcodone-acetaminophen (NORCO) 10-325 MG per tablet; Take 1 tablet by mouth every 8 (eight) hours as needed.    Exam suspicious for bicipital tendon tear, provided with home rehabilitation exercises and stretches along with anti-inflammatory above and as needed Norco. If no better in 2-3 weeks follow-up with Dr. Darene Lamer with a sports medicine referral for second opinion and to determine if imaging is warranted.   Return in about 4 weeks (around 08/06/2014), or if symptoms worsen or fail to improve.

## 2014-07-23 ENCOUNTER — Ambulatory Visit: Payer: No Typology Code available for payment source | Admitting: Family Medicine

## 2014-07-30 ENCOUNTER — Ambulatory Visit (INDEPENDENT_AMBULATORY_CARE_PROVIDER_SITE_OTHER): Payer: No Typology Code available for payment source | Admitting: Family Medicine

## 2014-07-30 ENCOUNTER — Encounter: Payer: Self-pay | Admitting: Family Medicine

## 2014-07-30 VITALS — BP 118/85 | HR 97 | Wt 250.0 lb

## 2014-07-30 DIAGNOSIS — S46111A Strain of muscle, fascia and tendon of long head of biceps, right arm, initial encounter: Secondary | ICD-10-CM

## 2014-07-30 DIAGNOSIS — M25561 Pain in right knee: Secondary | ICD-10-CM

## 2014-07-30 MED ORDER — MELOXICAM 15 MG PO TABS: 15.0000 mg | ORAL_TABLET | Freq: Every day | ORAL | Status: DC

## 2014-07-30 NOTE — Progress Notes (Signed)
CC: Albert Hogan is a 43 y.o. male is here for right knee pain   Subjective: HPI:  Complains of right knee pain that began 2 weeks ago for the first week it was just annoying but now it seems to be more moderate in severity and bothering his quality of life. He localizes it to the anterior superior and lateral aspect of the knee when he puts any pressure on the knee or his weightbearing. He also has some fullness in the back of the knee that is worse with flexion slightly improved with extension. He denies any recent or remote trauma and has never had this pain before. He feels unstable when going up and down stairs but denies any locking catching or giving way. He denies any swelling redness or overlying warmth.  He is not sure whether or not meloxicam is helping. He denies fevers, chills, joint pain elsewhere other than his right shoulder which she states is greatly improved.   Review Of Systems Outlined In HPI  Past Medical History  Diagnosis Date  . Hypertension   . ED (erectile dysfunction)     Past Surgical History  Procedure Laterality Date  . Hernia repair    . Appendectomy  2001   Family History  Problem Relation Age of Onset  . Hypertension Mother   . Thyroid disease Mother   . Hypertension Father     History   Social History  . Marital Status: Single    Spouse Name: N/A    Number of Children: N/A  . Years of Education: N/A   Occupational History  . Not on file.   Social History Main Topics  . Smoking status: Never Smoker   . Smokeless tobacco: Not on file  . Alcohol Use: Not on file  . Drug Use: No  . Sexual Activity: Not on file   Other Topics Concern  . Not on file   Social History Narrative     Objective: BP 118/85 mmHg  Pulse 97  Wt 250 lb (113.399 kg)  General: Alert and Oriented, No Acute Distress HEENT: Pupils equal, round, reactive to light. Conjunctivae clear.  Moist mucous membranes Lungs: Clear and comfortable work of  breathing Cardiac: Regular rate and rhythm. Extremities: No peripheral edema.  Strong peripheral pulses. Right knee exam shows full-strength and range of motion. There is no swelling, redness, nor warmth overlying the knee.  No patellar crepitus. No patellar apprehension. No pain with palpation of the inferior patellar pole.  No pain or laxity with valgus nor varus stress. Anterior drawer is negative. McMurray's questionably positive reproducing his presenting pain today. No popliteal space tenderness or palpable mass. No medial or lateral joint line tenderness to palpation. Mental Status: No depression, anxiety, nor agitation. Skin: Warm and dry.  Assessment & Plan: Albert Hogan was seen today for right knee pain.  Diagnoses and associated orders for this visit:  Knee pain, acute, right  Biceps tendon tear, right, initial encounter - meloxicam (MOBIC) 15 MG tablet; Take 1 tablet (15 mg total) by mouth daily. - meloxicam (MOBIC) 15 MG tablet; Take 1 tablet (15 mg total) by mouth daily.    Right knee pain: Discussed that I'm suspicious that he has a mild degenerative meniscal tear. Offered steroid injection today however he wants the least invasive approach to be addressed first. Beginning home physical therapy he was given a rehabilitative exercise regimen to do on a daily basis for the next month along with refills on meloxicam. If no signs of  improvement after 1 week of exercises I've asked him to call me and I will refer to formal physical therapy  Return if symptoms worsen or fail to improve.

## 2014-08-13 ENCOUNTER — Other Ambulatory Visit: Payer: Self-pay | Admitting: Family Medicine

## 2014-10-14 ENCOUNTER — Ambulatory Visit (INDEPENDENT_AMBULATORY_CARE_PROVIDER_SITE_OTHER): Payer: No Typology Code available for payment source | Admitting: Family Medicine

## 2014-10-14 ENCOUNTER — Encounter: Payer: Self-pay | Admitting: Family Medicine

## 2014-10-14 VITALS — BP 123/74 | HR 90 | Wt 251.0 lb

## 2014-10-14 DIAGNOSIS — N529 Male erectile dysfunction, unspecified: Secondary | ICD-10-CM | POA: Insufficient documentation

## 2014-10-14 DIAGNOSIS — E785 Hyperlipidemia, unspecified: Secondary | ICD-10-CM

## 2014-10-14 DIAGNOSIS — I10 Essential (primary) hypertension: Secondary | ICD-10-CM

## 2014-10-14 DIAGNOSIS — M25561 Pain in right knee: Secondary | ICD-10-CM

## 2014-10-14 LAB — BASIC METABOLIC PANEL WITH GFR
BUN: 20 mg/dL (ref 6–23)
CHLORIDE: 102 meq/L (ref 96–112)
CO2: 26 mEq/L (ref 19–32)
Calcium: 8.8 mg/dL (ref 8.4–10.5)
Creat: 1.24 mg/dL (ref 0.50–1.35)
GFR, Est African American: 82 mL/min
GFR, Est Non African American: 71 mL/min
GLUCOSE: 90 mg/dL (ref 70–99)
POTASSIUM: 3.9 meq/L (ref 3.5–5.3)
Sodium: 138 mEq/L (ref 135–145)

## 2014-10-14 LAB — LIPID PANEL
Cholesterol: 155 mg/dL (ref 0–200)
HDL: 45 mg/dL (ref >=40)
LDL Cholesterol: 95 mg/dL (ref 0–99)
TRIGLYCERIDES: 76 mg/dL (ref <150)
Total CHOL/HDL Ratio: 3.4 Ratio
VLDL: 15 mg/dL (ref 0–40)

## 2014-10-14 NOTE — Progress Notes (Signed)
CC: Albert Hogan is a 44 y.o. male is here for Knee Pain   Subjective: HPI:  Follow-up essential hypertension: Continues on Benicar HCT on a daily basis. No known side effects but requesting refills. No outside blood pressures to report. No chest pain shortness of breath orthopnea nor peripheral edema  Follow-up hyperlipidemia: He's been working out most days of the week and trying to watch what he eats. No limb claudication or history of cardiovascular disease  Erectile dysfunction follow-up: Still gets benefit from Cialis taken on an as-needed basis for difficulty initiating and maintaining an erection. No genitourinary complaints today. Still has difficulty to a moderate degree of the above symptoms if he does not take this medication.  Complains of right knee pain has been present for at least 3 months now on a daily basis. Feels unstable going downstairs. It feels stiff after periods of inactivity. He denies any locking or giving way but feels like he could give way when going downstairs. Occasional swelling. Symptoms are moderate in severity. Symptoms are improved with wearing a compression sleeve. Nothing else seems to make knee pain better or worse no role response to meloxicam. He's been doing home revealed positive exercises since I saw him last without much benefit. Denies joint pain elsewhere   Review Of Systems Outlined In HPI  Past Medical History  Diagnosis Date  . Hypertension   . ED (erectile dysfunction)     Past Surgical History  Procedure Laterality Date  . Hernia repair    . Appendectomy  2001   Family History  Problem Relation Age of Onset  . Hypertension Mother   . Thyroid disease Mother   . Hypertension Father     History   Social History  . Marital Status: Single    Spouse Name: N/A  . Number of Children: N/A  . Years of Education: N/A   Occupational History  . Not on file.   Social History Main Topics  . Smoking status: Never Smoker   .  Smokeless tobacco: Not on file  . Alcohol Use: Not on file  . Drug Use: No  . Sexual Activity: Not on file   Other Topics Concern  . Not on file   Social History Narrative     Objective: BP 123/74 mmHg  Pulse 90  Wt 251 lb (113.853 kg)  SpO2 98%  General: Alert and Oriented, No Acute Distress HEENT: Pupils equal, round, reactive to light. Conjunctivae clear.  Moist mucous membranes Lungs: Clear to auscultation bilaterally, no wheezing/ronchi/rales.  Comfortable work of breathing. Good air movement. Cardiac: Regular rate and rhythm. Normal S1/S2.  No murmurs, rubs, nor gallops.   Right knee exam shows full-strength and range of motion. There is no swelling, redness, nor warmth overlying the knee.  No patellar crepitus. No patellar apprehension. No pain with palpation of the inferior patellar pole.  No pain or laxity with valgus nor varus stress. Anterior drawer is negative. . No popliteal space tenderness or palpable mass. Medial and lateral joint line tenderness to palpation. Extremities: No peripheral edema.  Strong peripheral pulses.  Mental Status: No depression, anxiety, nor agitation. Skin: Warm and dry.  Assessment & Plan: Albert Hogan was seen today for knee pain.  Diagnoses and all orders for this visit:  Hyperlipidemia Orders: -     Lipid panel  Essential hypertension Orders: -     BASIC METABOLIC PANEL WITH GFR  Right knee pain  Erectile dysfunction, unspecified erectile dysfunction type   HYPERLIPIDEMIA: Due for  repeat lipid panel clinically controlled Essential hypertension: Controlled continue Benicar pending metabolic panel today Erectile dysfunction: Will refill Cialis provided no surprises on his blood work today Right knee pain: Suspect osteoarthritis, injection provided today if no benefit after next week call and I will refer to physical therapy with an x-ray.  KneeInjection Procedure Note  Pre-operative Diagnosis: right OA  Post-operative Diagnosis:  same  Indications: Symptom relief from osteoarthritis  Anesthesia: topical cold spray  Procedure Details   Verbal consent was obtained for the procedure. The joint was prepped with alcohol and numbed with topical cold spray A 22 gauge needle was inserted into the inferior aspect of the joint from a lateral approach. 9ml 1% lidocaine and 2 ml of triamcinolone (KENALOG) 40mg /ml was then injected into the joint through the same needle. The needle was removed and the area cleansed and dressed.  Complications:  None; patient tolerated the procedure well.  Return if symptoms worsen or fail to improve.

## 2014-10-15 ENCOUNTER — Telehealth: Payer: Self-pay | Admitting: Family Medicine

## 2014-10-15 DIAGNOSIS — I10 Essential (primary) hypertension: Secondary | ICD-10-CM

## 2014-10-15 MED ORDER — TADALAFIL 20 MG PO TABS: ORAL_TABLET | ORAL | Status: DC

## 2014-10-15 MED ORDER — OLMESARTAN MEDOXOMIL-HCTZ 40-12.5 MG PO TABS: 1.0000 | ORAL_TABLET | Freq: Every day | ORAL | Status: DC

## 2014-10-15 NOTE — Telephone Encounter (Signed)
Pt.notified

## 2014-10-15 NOTE — Telephone Encounter (Signed)
Albert Hogan, Will you please let patient know that his blood sugar was normal, kidney function stable, and cholesterol normal.  Refills of his medications sent to [facility].

## 2014-10-24 ENCOUNTER — Telehealth: Payer: Self-pay | Admitting: *Deleted

## 2014-10-24 NOTE — Telephone Encounter (Signed)
Pt states he would like to purchase a health tracker and he wants to use his flex spending card. Pt needs a letter of med Delma Post written so that he can submit this to his insurance company. Pt printed letter off of my chart.

## 2015-01-21 ENCOUNTER — Encounter: Payer: No Typology Code available for payment source | Admitting: Family Medicine

## 2015-01-28 ENCOUNTER — Encounter: Payer: Self-pay | Admitting: Family Medicine

## 2015-01-28 ENCOUNTER — Ambulatory Visit (INDEPENDENT_AMBULATORY_CARE_PROVIDER_SITE_OTHER): Payer: No Typology Code available for payment source | Admitting: Family Medicine

## 2015-01-28 VITALS — BP 130/91 | HR 84 | Ht 73.0 in | Wt 253.0 lb

## 2015-01-28 DIAGNOSIS — Z0000 Encounter for general adult medical examination without abnormal findings: Secondary | ICD-10-CM | POA: Diagnosis not present

## 2015-01-28 DIAGNOSIS — B360 Pityriasis versicolor: Secondary | ICD-10-CM

## 2015-01-28 LAB — COMPLETE METABOLIC PANEL WITH GFR
ALT: 24 U/L (ref 0–53)
AST: 30 U/L (ref 0–37)
Albumin: 4.6 g/dL (ref 3.5–5.2)
Alkaline Phosphatase: 65 U/L (ref 39–117)
BUN: 25 mg/dL — AB (ref 6–23)
CHLORIDE: 102 meq/L (ref 96–112)
CO2: 30 mEq/L (ref 19–32)
CREATININE: 1.43 mg/dL — AB (ref 0.50–1.35)
Calcium: 9.8 mg/dL (ref 8.4–10.5)
GFR, EST AFRICAN AMERICAN: 69 mL/min
GFR, EST NON AFRICAN AMERICAN: 60 mL/min
GLUCOSE: 81 mg/dL (ref 70–99)
Potassium: 4.6 mEq/L (ref 3.5–5.3)
Sodium: 138 mEq/L (ref 135–145)
Total Bilirubin: 0.5 mg/dL (ref 0.2–1.2)
Total Protein: 7.8 g/dL (ref 6.0–8.3)

## 2015-01-28 LAB — LIPID PANEL
Cholesterol: 196 mg/dL (ref 0–200)
HDL: 61 mg/dL (ref >=40)
LDL Cholesterol: 122 mg/dL — ABNORMAL HIGH (ref 0–99)
Total CHOL/HDL Ratio: 3.2 Ratio
Triglycerides: 63 mg/dL (ref <150)
VLDL: 13 mg/dL (ref 0–40)

## 2015-01-28 LAB — CBC
HCT: 46.7 % (ref 39.0–52.0)
HEMOGLOBIN: 15.6 g/dL (ref 13.0–17.0)
MCH: 29.5 pg (ref 26.0–34.0)
MCHC: 33.4 g/dL (ref 30.0–36.0)
MCV: 88.4 fL (ref 78.0–100.0)
MPV: 12.1 fL (ref 8.6–12.4)
PLATELETS: 202 10*3/uL (ref 150–400)
RBC: 5.28 MIL/uL (ref 4.22–5.81)
RDW: 14.6 % (ref 11.5–15.5)
WBC: 7.5 10*3/uL (ref 4.0–10.5)

## 2015-01-28 NOTE — Patient Instructions (Signed)
Dr. Cyndie Woodbeck's General Advice Following Your Complete Physical Exam  The Benefits of Regular Exercise: Unless you suffer from an uncontrolled cardiovascular condition, studies strongly suggest that regular exercise and physical activity will add to both the quality and length of your life.  The World Health Organization recommends 150 minutes of moderate intensity aerobic activity every week.  This is best split over 3-4 days a week, and can be as simple as a brisk walk for just over 35 minutes "most days of the week".  This type of exercise has been shown to lower LDL-Cholesterol, lower average blood sugars, lower blood pressure, lower cardiovascular disease risk, improve memory, and increase one's overall sense of wellbeing.  The addition of anaerobic (or "strength training") exercises offers additional benefits including but not limited to increased metabolism, prevention of osteoporosis, and improved overall cholesterol levels.  How Can I Strive For A Low-Fat Diet?: Current guidelines recommend that 25-35 percent of your daily energy (food) intake should come from fats.  One might ask how can this be achieved without having to dissect each meal on a daily basis?  Switch to skim or 1% milk instead of whole milk.  Focus on lean meats such as ground turkey, fresh fish, baked chicken, and lean cuts of beef as your source of dietary protein.  Limit saturated fat consumption to less than 10% of your daily caloric intake.  Limit trans fatty acid consumption primarily by limiting synthetic trans fats such as partially hydrogenated oils (Ex: fried fast foods).  Substitute olive or vegetable oil for solid fats where possible.  Moderation of Salt Intake: Provided you don't carry a diagnosis of congestive heart failure nor renal failure, I recommend a daily allowance of no more than 2300 mg of salt (sodium).  Keeping under this daily goal is associated with a decreased risk of cardiovascular events, creeping  above it can lead to elevated blood pressures and increases your risk of cardiovascular events.  Milligrams (mg) of salt is listed on all nutrition labels, and your daily intake can add up faster than you think.  Most canned and frozen dinners can pack in over half your daily salt allowance in one meal.    Lifestyle Health Risks: Certain lifestyle choices carry specific health risks.  As you may already know, tobacco use has been associated with increasing one's risk of cardiovascular disease, pulmonary disease, numerous cancers, among many other issues.  What you may not know is that there are medications and nicotine replacement strategies that can more than double your chances of successfully quitting.  I would be thrilled to help manage your quitting strategy if you currently use tobacco products.  When it comes to alcohol use, I've yet to find an "ideal" daily allowance.  Provided an individual does not have a medical condition that is exacerbated by alcohol consumption, general guidelines determine "safe drinking" as no more than two standard drinks for a man or no more than one standard drink for a male per day.  However, much debate still exists on whether any amount of alcohol consumption is technically "safe".  My general advice, keep alcohol consumption to a minimum for general health promotion.  If you or others believe that alcohol, tobacco, or recreational drug use is interfering with your life, I would be happy to provide confidential counseling regarding treatment options.  General "Over The Counter" Nutrition Advice: Postmenopausal women should aim for a daily calcium intake of 1200 mg, however a significant portion of this might already be   provided by diets including milk, yogurt, cheese, and other dairy products.  Vitamin D has been shown to help preserve bone density, prevent fatigue, and has even been shown to help reduce falls in the elderly.  Ensuring a daily intake of 800 Units of  Vitamin D is a good place to start to enjoy the above benefits, we can easily check your Vitamin D level to see if you'd potentially benefit from supplementation beyond 800 Units a day.  Folic Acid intake should be of particular concern to women of childbearing age.  Daily consumption of 400-800 mcg of Folic Acid is recommended to minimize the chance of spinal cord defects in a fetus should pregnancy occur.    For many adults, accidents still remain one of the most common culprits when it comes to cause of death.  Some of the simplest but most effective preventitive habits you can adopt include regular seatbelt use, proper helmet use, securing firearms, and regularly testing your smoke and carbon monoxide detectors.  Mithra Spano B. Marijke Guadiana DO [facility] [address], [address] [place], [place] [address] Phone: [phone]  

## 2015-01-28 NOTE — Progress Notes (Signed)
CC: Albert Hogan is a 44 y.o. male is here for Annual Exam   Subjective: HPI:  No family history of colon cancer or prostate cancer will begin screening at age 64  Influenza Vaccine: Out of season Pneumovax: No current indication Td/Tdap: UTD Zoster: (Start 44 yo)  Requesting complete physical exam with his only complaint being white spots on the torso. He is not sure how long it's been there. They're painless and do not itch. He does not seem to be bothered by them he is just not sure why they're there.  Review of Systems - General ROS: negative for - chills, fever, night sweats, weight gain or weight loss Ophthalmic ROS: negative for - decreased vision Psychological ROS: negative for - anxiety or depression ENT ROS: negative for - hearing change, nasal congestion, tinnitus or allergies Hematological and Lymphatic ROS: negative for - bleeding problems, bruising or swollen lymph nodes Breast ROS: negative Respiratory ROS: no cough, shortness of breath, or wheezing Cardiovascular ROS: no chest pain or dyspnea on exertion Gastrointestinal ROS: no abdominal pain, change in bowel habits, or black or bloody stools Genito-Urinary ROS: negative for - genital discharge, genital ulcers, incontinence or abnormal bleeding from genitals Musculoskeletal ROS: negative for - joint pain or muscle pain Neurological ROS: negative for - headaches or memory loss Dermatological ROS: negative for lumps, mole changes, rash and skin lesion changes other than that described above  Past Medical History  Diagnosis Date  . Hypertension   . ED (erectile dysfunction)     Past Surgical History  Procedure Laterality Date  . Hernia repair    . Appendectomy  2001   Family History  Problem Relation Age of Onset  . Hypertension Mother   . Thyroid disease Mother   . Hypertension Father     History   Social History  . Marital Status: Single    Spouse Name: N/A  . Number of Children: N/A  . Years of  Education: N/A   Occupational History  . Not on file.   Social History Main Topics  . Smoking status: Never Smoker   . Smokeless tobacco: Not on file  . Alcohol Use: Not on file  . Drug Use: No  . Sexual Activity: Not on file   Other Topics Concern  . Not on file   Social History Narrative     Objective: BP 130/91 mmHg  Pulse 84  Ht 6\' 1"  (1.854 m)  Wt 253 lb (114.76 kg)  BMI 33.39 kg/m2  General: No Acute Distress HEENT: Atraumatic, normocephalic, conjunctivae normal without scleral icterus.  No nasal discharge, hearing grossly intact, TMs with good landmarks bilaterally with no middle ear abnormalities, posterior pharynx clear without oral lesions. Neck: Supple, trachea midline, no cervical nor supraclavicular adenopathy. Pulmonary: Clear to auscultation bilaterally without wheezing, rhonchi, nor rales. Cardiac: Regular rate and rhythm.  No murmurs, rubs, nor gallops. No peripheral edema.  2+ peripheral pulses bilaterally. Abdomen: Bowel sounds normal.  No masses.  Non-tender without rebound.  Negative Murphy's sign MSK: Grossly intact, no signs of weakness.  Full strength throughout upper and lower extremities.  Full ROM in upper and lower extremities.  No midline spinal tenderness. Neuro: Gait unremarkable, CN II-XII grossly intact.  C5-C6 Reflex 2/4 Bilaterally, L4 Reflex 2/4 Bilaterally.  Cerebellar function intact. Skin: No rashes. Hypopigmented macules on the torso, mild in severity Psych: Alert and oriented to person/place/time.  Thought process normal. No anxiety/depression.  Assessment & Plan: [place] was seen today for annual exam.  Diagnoses and all orders for this visit:  Annual physical exam Orders: -     Lipid panel -     COMPLETE METABOLIC PANEL WITH GFR -     CBC -     HIV antibody  Tinea versicolor   Healthy lifestyle interventions including but not limited to regular exercise, a healthy low fat diet, moderation of salt intake, the dangers of  tobacco/alcohol/recreational drug use, nutrition supplementation, and accident avoidance were discussed with the patient and a handout was provided for future reference. He's not that interested in taking any medication for his tinea versicolor but will let me know if he changes his mind.  Return in about 3 months (around 04/30/2015) for BP follow up.

## 2015-01-29 LAB — HIV ANTIBODY (ROUTINE TESTING W REFLEX): HIV 1&2 Ab, 4th Generation: NONREACTIVE

## 2015-07-16 ENCOUNTER — Other Ambulatory Visit: Payer: Self-pay | Admitting: Family Medicine

## 2015-07-28 ENCOUNTER — Ambulatory Visit (INDEPENDENT_AMBULATORY_CARE_PROVIDER_SITE_OTHER): Payer: No Typology Code available for payment source | Admitting: Family Medicine

## 2015-07-28 ENCOUNTER — Encounter: Payer: Self-pay | Admitting: Family Medicine

## 2015-07-28 ENCOUNTER — Ambulatory Visit (INDEPENDENT_AMBULATORY_CARE_PROVIDER_SITE_OTHER): Payer: No Typology Code available for payment source

## 2015-07-28 VITALS — BP 130/89 | HR 92 | Wt 258.0 lb

## 2015-07-28 DIAGNOSIS — S83281A Other tear of lateral meniscus, current injury, right knee, initial encounter: Secondary | ICD-10-CM | POA: Insufficient documentation

## 2015-07-28 DIAGNOSIS — M25561 Pain in right knee: Secondary | ICD-10-CM

## 2015-07-28 DIAGNOSIS — M25562 Pain in left knee: Secondary | ICD-10-CM

## 2015-07-28 NOTE — Assessment & Plan Note (Signed)
Symptoms history very likely medial meniscus injury/tear with also lateral plica syndrome. At this point he's failed conservative therapy. Proceed to MRI. Will call patient with results.

## 2015-07-28 NOTE — Progress Notes (Signed)
CC: Albert Hogan is a 44 y.o. male is here for Knee Pain   Subjective: HPI:  Right knee pain that is localized on the anterior surface of the knee that radiates through the knee to the back of the knee. Symptoms are worse going up and down stairs or crossing his legs. He occasionally feels like the knee is unstable. He denies any catching locking or giving way. Occasionally will swell if he is walking for more than a few minutes. He denies any redness or warmth. Interventions have included nonsteroidal anti-inflammatories which are no longer beneficial. He also had a steroid injection which was beneficial for a few months. He's been dealing with this pain for matter of years however it's been more problematic over the past 1 or 2 months. Denies joint pain elsewhere. Denies fevers or chills.   Review Of Systems Outlined In HPI  Past Medical History  Diagnosis Date  . Hypertension   . ED (erectile dysfunction)     Past Surgical History  Procedure Laterality Date  . Hernia repair    . Appendectomy  2001   Family History  Problem Relation Age of Onset  . Hypertension Mother   . Thyroid disease Mother   . Hypertension Father     Social History   Social History  . Marital Status: Single    Spouse Name: N/A  . Number of Children: N/A  . Years of Education: N/A   Occupational History  . Not on file.   Social History Main Topics  . Smoking status: Never Smoker   . Smokeless tobacco: Not on file  . Alcohol Use: Not on file  . Drug Use: No  . Sexual Activity: Not on file   Other Topics Concern  . Not on file   Social History Narrative     Objective: BP 130/89 mmHg  Pulse 92  Wt 258 lb (117.028 kg)  Vital signs reviewed. General: Alert and Oriented, No Acute Distress HEENT: Pupils equal, round, reactive to light. Conjunctivae clear.  External ears unremarkable.  Moist mucous membranes. Lungs: Clear and comfortable work of breathing, speaking in full sentences without  accessory muscle use. Cardiac: Regular rate and rhythm.  Right knee exam shows full-strength and range of motion. There is no swelling, redness, nor warmth overlying the knee.  No patellar crepitus. No patellar apprehension. No pain with palpation of the inferior patellar pole.  No pain or laxity with valgus nor varus stress. Anterior drawer is negative.. No popliteal space tenderness or palpable mass. No medial or lateral joint line tenderness to palpation. Extremities: No peripheral edema.  Strong peripheral pulses.  Mental Status: No depression, anxiety, nor agitation. Logical though process. Skin: Warm and dry.  Assessment & Plan: [name] was seen today for knee pain.  Diagnoses and all orders for this visit:  Right knee pain -     DG Knee Complete 4 Views Right; Future -     DG Knee 1-2 Views Left; Future   I suspect his knee pain is most likely coming from a meniscal tear, I will start workup by getting the above films. He was referred to Dr. Georgina Snell in sports medicine for further management and workup.  Return if symptoms worsen or fail to improve.

## 2015-07-28 NOTE — Patient Instructions (Signed)
Thank you for coming in today. Get MRI.  We will discuss results afterwards.  Do some research where the insurance wants you to have the surgery.   Meniscus Tear A meniscus tear is a knee injury in which a piece of the meniscus is torn. The meniscus is a thick, rubbery, wedge-shaped cartilage in the knee. Two menisci are located in each knee. They sit between the upper bone (femur) and lower bone (tibia) that make up the knee joint. Each meniscus acts as a shock absorber for the knee. A torn meniscus is one of the most common types of knee injuries. This injury can range from mild to severe. Surgery may be needed for a severe tear. CAUSES This injury may be caused by any squatting, twisting, or pivoting movement. Sports-related injuries are the most common cause. These often occur from:  Running and stopping suddenly.  Changing direction.  Being tackled or knocked off your feet. As people get older, their meniscus gets thinner and weaker. In these people, tears can happen more easily, such as from climbing stairs.  RISK FACTORS This injury is more likely to happen to:  People who play contact sports.  Males.  People who are 4-38 years of age. SYMPTOMS  Symptoms of this injury include:  Knee pain, especially at the side of the knee joint. You may feel pain when the injury occurs, or you may only hear a pop and feel pain later.  A feeling that your knee is clicking, catching, locking, or giving way.  Not being able to fully bend or extend your knee.  Bruising or swelling in your knee. DIAGNOSIS  This injury may be diagnosed based on your symptoms and a physical exam. The physical exam may include:  Moving your knee in different ways.  Feeling for tenderness.  Listening for a clicking sound.  Checking if your knee locks or catches. You may also have tests, such as:  X-rays.  MRI.  A procedure to look inside your knee with a narrow surgical telescope  (arthroscopy). You may be referred to a knee specialist (orthopedic surgeon). TREATMENT  Treatment for this injury depends on the severity of the tear. Treatment for a mild tear may include:  Rest.  Medicine to reduce pain and swelling. This is usually a nonsteroidal anti-inflammatory drug (NSAID).  A knee brace or an elastic sleeve or wrap.  Using crutches or a walker to keep weight off your knee and to help you walk.  Exercises to strengthen your knee (physical therapy). You may need surgery if you have a severe tear or if other treatments are not working.  HOME CARE INSTRUCTIONS Managing Pain and Swelling  Take over-the-counter and prescription medicines only as told by your health care provider.  If directed, apply ice to the injured area:  Put ice in a plastic bag.  Place a towel between your skin and the bag.  Leave the ice on for 20 minutes, 2-3 times per day.  Raise (elevate) the injured area above the level of your heart while you are sitting or lying down. Activity  Do not use the injured limb to support your body weight until your health care provider says that you can. Use crutches or a walker as told by your health care provider.  Return to your normal activities as told by your health care provider. Ask your health care provider what activities are safe for you.  Perform range-of-motion exercises only as told by your health care provider.  Begin  doing exercises to strengthen your knee and leg muscles only as told by your health care provider. After you recover, your health care provider may recommend these exercises to help prevent another injury. General Instructions  Use a knee brace or elastic wrap as told by your health care provider.  Keep all follow-up visits as told by your health care provider. This is important. SEEK MEDICAL CARE IF:  You have a fever.  Your knee becomes red, tender, or swollen.  Your pain medicine is not helping.  Your  symptoms get worse or do not improve after 2 weeks of home care.   This information is not intended to replace advice given to you by your health care provider. Make sure you discuss any questions you have with your health care provider.   Document Released: 10/22/2002 Document Revised: 04/22/2015 Document Reviewed: 11/24/2014 Elsevier Interactive Patient Education [employer].

## 2015-07-28 NOTE — Progress Notes (Signed)
Subjective:    I'm seeing this patient as a consultation for:  Dr Ileene Rubens  CC: Right knee pain  HPI: Patient has had right knee pain off and on for a year or so now. He denies any injury. He notes pain at the lateral and medial aspects of the knee. He also notes swelling. He feels popping occasionally. Occasionally he feels some catching as well. He has a palpable painful area that moves with knee extension on the superior lateral portion of the knee. He denies any radiating pain weakness or numbness fevers or chills. He has had a steroid injection which helped temporarily. He's tried home exercises ice resting in over-the-counter medicines which have been mildly helpful.  Past medical history, Surgical history, Family history not pertinant except as noted below, Social history, Allergies, and medications have been entered into the medical record, reviewed, and no changes needed.   Review of Systems: No headache, visual changes, nausea, vomiting, diarrhea, constipation, dizziness, abdominal pain, skin rash, fevers, chills, night sweats, weight loss, swollen lymph nodes, body aches, joint swelling, muscle aches, chest pain, shortness of breath, mood changes, visual or auditory hallucinations.   Objective:    Filed Vitals:   07/28/15 1004  BP: 130/89  Pulse: 92   General: Well Developed, well nourished, and in no acute distress.  Neuro/Psych: Alert and oriented x3, extra-ocular muscles intact, able to move all 4 extremities, sensation grossly intact. Skin: Warm and dry, no rashes noted.  Respiratory: Not using accessory muscles, speaking in full sentences, trachea midline.  Cardiovascular: Pulses palpable, no extremity edema. Abdomen: Does not appear distended. MSK: Right knee:  Mild effusion. Normal-appearing otherwise.  range of motion 0-120. Tender to palpation medial joint line. Palpable plica present with knee extension overlying the lateral superior corner of the  patella. Negative anterior and posterior drawer with firm endpoints. Negative valgus and varus stress. Mildly positive medial McMurray's test negative lateral. Positive Thessaly's test Capillary refill sensation intact distally bilaterally  Contralateral left knee is normal-appearing with no effusion Normal range of motion Nontender Negative ligamentous testing. Negative McMurray's test    No results found for this or any previous visit (from the past 24 hour(s)). Dg Knee 1-2 Views Left  07/28/2015  CLINICAL DATA:  Right interior peripatellar knee pain with mild swelling the past several weeks without known injury ; left knee is included for comparison. EXAM: LEFT KNEE - 1-2 VIEW COMPARISON:  None. FINDINGS: The bones of the right knee are adequately mineralized. There is minimal narrowing of the medial joint compartment. There is minimal beaking of the tibial spines. There is a tiny spur arising from the superior articular margin of the patella. There is a small suprapatellar E fusion. The patella appears of appropriately position with respect to the distal femur. There is no chondrocalcinosis. The proximal fibula is intact. Comparison views of the left knee reveal no acute or significant chronic bony abnormalities. 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 There is a suprapatellar effusion. Electronically Signed   By: David  Martinique M.D.   On: 07/28/2015 10:44   Dg Knee Complete 4 Views Right  07/28/2015  CLINICAL DATA:  Knee pain.  Initial evaluation. EXAM: RIGHT KNEE - COMPLETE 4+ VIEW COMPARISON:  None. FINDINGS: Knee joint effusion appears to be present. No evidence of fracture or dislocation. No focal bony  abnormality. Mild patellofemoral medial compartment degenerative change pre IMPRESSION: 1. Knee  joint effusion appears to be present. No acute bony abnormality. 2.  Mild degenerative change cannot be excluded. Electronically Signed   By: [place]   On: 07/28/2015 10:38    Impression and Recommendations:   This case required medical decision making of moderate complexity.

## 2015-08-03 ENCOUNTER — Other Ambulatory Visit: Payer: No Typology Code available for payment source

## 2015-08-18 ENCOUNTER — Ambulatory Visit (INDEPENDENT_AMBULATORY_CARE_PROVIDER_SITE_OTHER): Payer: No Typology Code available for payment source

## 2015-08-18 DIAGNOSIS — S83281D Other tear of lateral meniscus, current injury, right knee, subsequent encounter: Secondary | ICD-10-CM

## 2015-08-18 DIAGNOSIS — X58XXXD Exposure to other specified factors, subsequent encounter: Secondary | ICD-10-CM

## 2015-08-18 DIAGNOSIS — M7121 Synovial cyst of popliteal space [Baker], right knee: Secondary | ICD-10-CM

## 2015-08-18 DIAGNOSIS — M25461 Effusion, right knee: Secondary | ICD-10-CM

## 2015-08-18 DIAGNOSIS — M25561 Pain in right knee: Secondary | ICD-10-CM

## 2015-08-19 NOTE — Progress Notes (Signed)
Quick Note:  MRI shows a lateral meniscus tear. This is reasonable to refer to orthopedics. Do you want a referral to a surgeon? ______

## 2015-08-20 ENCOUNTER — Encounter: Payer: Self-pay | Admitting: Family Medicine

## 2015-08-20 ENCOUNTER — Ambulatory Visit (INDEPENDENT_AMBULATORY_CARE_PROVIDER_SITE_OTHER): Payer: No Typology Code available for payment source | Admitting: Family Medicine

## 2015-08-20 VITALS — BP 142/85 | HR 87 | Wt 257.0 lb

## 2015-08-20 DIAGNOSIS — S83281A Other tear of lateral meniscus, current injury, right knee, initial encounter: Secondary | ICD-10-CM

## 2015-08-20 NOTE — Patient Instructions (Signed)
Thank you for coming in today. Return as needed.  Let me know if you want surgery or an injection  Meniscus Injury, Arthroscopy Arthroscopy is a surgical procedure that involves the use of a small scope that has a camera and surgical instruments on the end (arthroscope). An arthroscope can be used to repair your meniscus injury.  LET [facility] CARE PROVIDER KNOW ABOUT:  Any allergies you have.  All medicines you are taking, including vitamins, herbs, eyedrops, creams, and over-the-counter medicines.  Any recent colds or infections you have had or currently have.  Previous problems you or members of your family have had with the use of anesthetics.  Any blood disorders or blood clotting problems you have.  Previous surgeries you have had.  Medical conditions you have. RISKS AND COMPLICATIONS Generally, this is a safe procedure. However, as with any procedure, problems can occur. Possible problems include:  Damage to nerves or blood vessels.  Excess bleeding.  Blood clots.  Infection. BEFORE THE PROCEDURE  Do not eat or drink for 6-8 hours before the procedure.  Take medicines as directed by your surgeon. Ask your surgeon about changing or stopping your regular medicines.  You may have lab tests the morning of surgery. PROCEDURE  You will be given one of the following:   A medicine that numbs the area (local anesthesia).  A medicine that makes you go to sleep (general anesthesia).  A medicine injected into your spine that numbs your body below the waist (spinal anesthesia). Most often, several small cuts (incisions) are made in the knee. The arthroscope and instruments go into the incisions to repair the damage. The torn portion of the meniscus is removed.  During this time, your surgeon may find a partial or complete tear in a cruciate ligament, such as the anterior cruciate ligament (ACL). A completely torn cruciate ligament is reconstructed by taking tissue from  another part of the body (grafting) and placing it into the injured area. This requires several larger incisions to complete the repair. Sometimes, open surgery is needed for collateral ligament injuries. If a collateral ligament is found to be injured, your surgeon may staple or suture the tear through a slightly larger incision on the side of the knee. AFTER THE PROCEDURE You will be taken to the recovery area where your progress will be monitored. When you are awake, stable, and taking fluids without complications, you will be allowed to go home. This is usually the same day. However, more extensive repairs of a ligament may require an overnight stay.  The recovery time after repairing your meniscus or ligament depends on the amount of damage to these structures. It also depends on whether or not reconstructive knee surgery was needed.   A torn or stretched ligament (ligament sprain) may take 6-8 weeks to heal. It takes about the same amount of time if your surgeon removed a torn meniscus.  A repaired meniscus may require 6-12 weeks of recovery time.  A torn ligament needing reconstructive surgery may take 6-12 months to heal fully.   This information is not intended to replace advice given to you by your health care provider. Make sure you discuss any questions you have with your health care provider.   Document Released: 07/29/2000 Document Revised: 08/06/2013 Document Reviewed: 12/28/2012 Elsevier Interactive Patient Education [employer].

## 2015-08-20 NOTE — Assessment & Plan Note (Signed)
Right knee lateral meniscus tear on MRI. Discussed options. Plan for watchful waiting at this time. Patient does not want to pursue surgery or injection currently. I think this is reasonable. Will return as needed.

## 2015-08-20 NOTE — Progress Notes (Signed)
       Albert Hogan is a 45 y.o. male who presents to [place]: [facility] today for follow-up right knee pain and MRI results. Patient had a recent MRI of his right knee showing lateral meniscus tear and Baker cyst. He notes pain is now mild and doing pretty well. Occasionally the pain interferes with his quality of life but currently is doing well.   Past Medical History  Diagnosis Date  . Hypertension   . ED (erectile dysfunction)    Past Surgical History  Procedure Laterality Date  . Hernia repair    . Appendectomy  2001   Social History  Substance Use Topics  . Smoking status: Never Smoker   . Smokeless tobacco: Not on file  . Alcohol Use: Not on file   family history includes Hypertension in his father and mother; Thyroid disease in his mother.  ROS as above Medications: Current Outpatient Prescriptions  Medication Sig Dispense Refill  . CIALIS 20 MG tablet TAKE ONE TABLET BY MOUTH ONCE DAILY AS NEEDED FOR ERECTILE DYSFUCTION 10 tablet 0  . cyclobenzaprine (FLEXERIL) 10 MG tablet Take a half to a full tab every 8-12 hours only as needed for muscle spasm, may cause sedation. 45 tablet 0  . EPINEPHrine (EPIPEN) 0.3 mg/0.3 mL DEVI Inject 0.3 mLs (0.3 mg total) into the muscle once. 2 Device 2  . olmesartan-hydrochlorothiazide (BENICAR HCT) 40-12.5 MG per tablet Take 1 tablet by mouth daily. 90 tablet 2   No current facility-administered medications for this visit.   Allergies  Allergen Reactions  . Shellfish Allergy      Exam:  BP 142/85 mmHg  Pulse 87  Wt 257 lb (116.574 kg) Gen: Well NAD Right knee is relatively normal appearing. Tender to palpation lateral joint line.  No results found for this or any previous visit (from the past 24 hour(s)). Mr Knee Right Wo Contrast  08/18/2015  CLINICAL DATA:  Medial and posterior right knee pain and swelling. Right knee stiffness with  bending. No known injury. Subsequent encounter. EXAM: MRI OF THE RIGHT KNEE WITHOUT CONTRAST TECHNIQUE: Multiplanar, multisequence MR imaging of the knee was performed. No intravenous contrast was administered. COMPARISON:  Plain films right knee 07/28/2015. FINDINGS: MENISCI Medial meniscus: Vonda [place]ig[place] is seen along the free edge of the body but no tear is identified. Lateral meniscus: The anterior horn is degenerated and markedly diminutive with horizontal tearing identified. LIGAMENTS Cruciates:  Intact. Collaterals:  Intact. CARTILAGE Patellofemoral:  Minimally degenerated. Medial:  Unremarkable. Lateral:  Mildly degenerated. Joint:  Moderate joint effusion. Popliteal Fossa: Baker's cyst contains some debris and measures approximately 1.9 cm transverse by 1.1 cm AP by 4.9 cm craniocaudal. Extensor Mechanism:  Intact. Bones:  No fracture or worrisome marrow lesion. IMPRESSION: The anterior horn of the lateral meniscus is markedly degenerated and diminutive with horizontal tearing identified. Mild fraying along the free edge of the body of the medial meniscus without tear is seen. Small to moderately sized Baker's cyst contains debris. Moderate joint effusion. Electronically Signed   By: Inge Rise M.D.   On: 08/18/2015 16:45     Please see individual assessment and plan sections.

## 2015-08-26 ENCOUNTER — Other Ambulatory Visit: Payer: Self-pay

## 2015-08-26 MED ORDER — TADALAFIL 20 MG PO TABS: ORAL_TABLET | ORAL | Status: DC

## 2015-08-28 LAB — BASIC METABOLIC PANEL
Creatinine: 1.2 mg/dL (ref 0.6–1.3)
GLUCOSE: 85 mg/dL
Potassium: 4.1 mmol/L (ref 3.4–5.3)

## 2015-08-28 LAB — HEPATIC FUNCTION PANEL
ALT: 24 U/L (ref 10–40)
AST: 28 U/L (ref 14–40)

## 2015-08-28 LAB — CBC AND DIFFERENTIAL
HEMOGLOBIN: 13.4 g/dL — AB (ref 13.5–17.5)
PLATELETS: 175 10*3/uL (ref 150–399)
WBC: 6.8 10^3/mL

## 2015-10-19 ENCOUNTER — Encounter: Payer: Self-pay | Admitting: Family Medicine

## 2015-11-12 ENCOUNTER — Other Ambulatory Visit: Payer: Self-pay | Admitting: Family Medicine

## 2015-11-27 HISTORY — PX: KNEE SURGERY: SHX244

## 2015-11-27 LAB — COMPREHENSIVE METABOLIC PANEL
Calcium: 9.5 mg/dL
TOTAL PROTEIN: 7.2 g/dL

## 2015-12-09 ENCOUNTER — Ambulatory Visit (INDEPENDENT_AMBULATORY_CARE_PROVIDER_SITE_OTHER): Payer: No Typology Code available for payment source | Admitting: Family Medicine

## 2015-12-09 ENCOUNTER — Encounter: Payer: Self-pay | Admitting: Family Medicine

## 2015-12-09 ENCOUNTER — Ambulatory Visit (HOSPITAL_BASED_OUTPATIENT_CLINIC_OR_DEPARTMENT_OTHER)
Admission: RE | Admit: 2015-12-09 | Discharge: 2015-12-09 | Disposition: A | Discharge status: Home / Self Care | Payer: No Typology Code available for payment source | Source: Ambulatory Visit | Attending: Family Medicine | Admitting: Family Medicine

## 2015-12-09 VITALS — BP 123/84 | HR 102 | Wt 251.0 lb

## 2015-12-09 DIAGNOSIS — M7989 Other specified soft tissue disorders: Secondary | ICD-10-CM | POA: Diagnosis not present

## 2015-12-09 DIAGNOSIS — S83281A Other tear of lateral meniscus, current injury, right knee, initial encounter: Secondary | ICD-10-CM | POA: Diagnosis not present

## 2015-12-09 MED ORDER — HYDROCODONE-ACETAMINOPHEN 5-325 MG PO TABS: 1.0000 | ORAL_TABLET | Freq: Four times a day (QID) | ORAL | Status: DC | PRN

## 2015-12-09 NOTE — Patient Instructions (Signed)
Thank you for coming in today. Get ultrasound in [place] today.  Return in 2 weeks.  Continue non-weight bearing.  Attend PT.   Incision Care An incision is when a surgeon cuts into your body. After surgery, the incision needs to be cared for properly to prevent infection.  HOW TO CARE FOR YOUR INCISION  Take medicines only as directed by your health care provider.  There are many different ways to close and cover an incision, including stitches, skin glue, and adhesive strips. Follow your health care provider's instructions on:  Incision care.  Bandage (dressing) changes and removal.  Incision closure removal.  Do not take baths, swim, or use a hot tub until your health care provider approves. You may shower as directed by your health care provider.  Resume your normal diet and activities as directed.  Use anti-itch medicine (such as an antihistamine) as directed by your health care provider. The incision may itch while it is healing. Do not pick or scratch at the incision.  Drink enough fluid to keep your urine clear or pale yellow. SEEK MEDICAL CARE IF:   You have drainage, redness, swelling, or pain at your incision site.  You have muscle aches, chills, or a general ill feeling.  You notice a bad smell coming from the incision or dressing.  Your incision edges separate after the sutures, staples, or skin adhesive strips have been removed.  You have persistent nausea or vomiting.  You have a fever.  You are dizzy. SEEK IMMEDIATE MEDICAL CARE IF:   You have a rash.  You faint.  You have difficulty breathing. MAKE SURE YOU:   Understand these instructions.  Will watch your condition.  Will get help right away if you are not doing well or get worse.   This information is not intended to replace advice given to you by your health care provider. Make sure you discuss any questions you have with your health care provider.   Document Released: 02/18/2005  Document Revised: 08/22/2014 Document Reviewed: 09/25/2013 Elsevier Interactive Patient Education [employer].

## 2015-12-09 NOTE — Addendum Note (Signed)
Addended by: Gregor Hams on: 12/09/2015 03:39 PM   Modules accepted: Orders

## 2015-12-09 NOTE — Progress Notes (Signed)
Quick Note:  No evidence of DVT on ultrasound. ______

## 2015-12-09 NOTE — Progress Notes (Addendum)
Albert Hogan is a 45 y.o. male who presents to [facility]: [facility] today for right knee pain. Patient was last seen in January where he was diagnosed with a lateral meniscus tear. He had failed conservative management and proceeded with arthroscopic partial meniscectomy. However this was performed by an orthopedic surgeon and [place] as it was cheaper with his insurance. He will receive his postoperative care through me. 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 This was treated with microfracture. His surgery date was April 12. His instructions are for nonweightbearing status for 6 weeks postoperative as well as for physical therapy.  He is reasonably well. He denies any fevers or chills or erythema or pus from the knee. He does note some right leg swelling and calf tenderness. He notes that he did fly from [place] to [place] a week after surgery. He denies any chest pains or palpitations. He notes that he is nearly out of his pain medications that were prescribed in the immediate postoperative period. He scheduled to return to work in a few weeks. He has a largely desk job.   Past Medical History  Diagnosis Date  . Hypertension   . ED (erectile dysfunction)    Past Surgical History  Procedure Laterality Date  . Hernia repair    . Appendectomy  2001   Social History  Substance Use Topics  . Smoking status: Never Smoker   . Smokeless tobacco: Not on file  . Alcohol Use: Not on file   family history includes Hypertension in his father and mother; Thyroid disease in his mother.  ROS as above Medications: Current Outpatient Prescriptions  Medication Sig Dispense Refill  . CIALIS 20 MG tablet TAKE ONE TABLET BY MOUTH ONCE DAILY AS NEEDED FOR  ERECTILE  DYSFUNCTION 10 tablet 0  . cyclobenzaprine (FLEXERIL)  10 MG tablet Take a half to a full tab every 8-12 hours only as needed for muscle spasm, may cause sedation. 45 tablet 0  . EPINEPHrine (EPIPEN) 0.3 mg/0.3 mL DEVI Inject 0.3 mLs (0.3 mg total) into the muscle once. 2 Device 2  . olmesartan-hydrochlorothiazide (BENICAR HCT) 40-12.5 MG tablet Take 1 tablet by mouth daily. NEED FOLLOW UP APPOINTMENT FOR MORE REFILLS 30 tablet 0  . tadalafil (CIALIS) 20 MG tablet TAKE ONE TABLET BY MOUTH ONCE DAILY AS NEEDED FOR ERECTILE DYSFUCTION 10 tablet 0  . HYDROcodone-acetaminophen (NORCO/VICODIN) 5-325 MG tablet Take 1 tablet by mouth every 6 (six) hours as needed. 25 tablet 0   No current facility-administered medications for this visit.   Allergies  Allergen Reactions  . Shellfish Allergy      Exam:  BP 123/84 mmHg  Pulse 102  Wt 251 lb (113.853 kg) Gen: Well NAD Right knee: Moderate effusion 3 well-appearing arthroscopic incisions with no erythema tenderness or drainage. Incisions are sutured with what looks to be a pursestring-type suture.  The calf diameter is greater on the right compared to the left and mildly tender to touch. No palpable cords or erythema. Pulses capillary refill and sensation intact distally. Knee motion is intact.   3 sutures removed  No results found for this or any previous visit (from the past 24 hour(s)). No results found.    45 year old male 2 weeks.right knee arthroscopic partial lateral meniscectomy, articular cartilage remodeling and microfracture. We will write to obtain medical records. Additionally we'll start physical therapy with  nonweightbearing status largely working on strengthening and motion. Additionally will refill pain medications.  However patient has calf tenderness and pain and swelling. This is likely sequelae from knee effusion however there is a chance for DVT especially as he's had recent surgery and a period of immobilization on an airplane. We'll obtain a lower extremity vascular  ultrasound today to evaluate for DVT.  Recheck in about 2 weeks. Continue nonweightbearing status.    Addendum:  Result available from orthopedic surgery. Patient had arthroscopic knee with partial medial and lateral meniscectomies. He additionally had chondroplasty of the medial femoral condyle and microfracture of the lateral femoral condyle to address a lateral femoral condyle osteochondral defect

## 2015-12-11 ENCOUNTER — Encounter: Payer: Self-pay | Admitting: Family Medicine

## 2015-12-11 ENCOUNTER — Encounter: Payer: Self-pay | Admitting: Rehabilitative and Restorative Service Providers"

## 2015-12-11 ENCOUNTER — Ambulatory Visit (INDEPENDENT_AMBULATORY_CARE_PROVIDER_SITE_OTHER): Payer: No Typology Code available for payment source | Admitting: Rehabilitative and Restorative Service Providers"

## 2015-12-11 ENCOUNTER — Telehealth: Payer: Self-pay

## 2015-12-11 DIAGNOSIS — M958 Other specified acquired deformities of musculoskeletal system: Secondary | ICD-10-CM | POA: Insufficient documentation

## 2015-12-11 DIAGNOSIS — M25561 Pain in right knee: Secondary | ICD-10-CM | POA: Diagnosis not present

## 2015-12-11 DIAGNOSIS — M25661 Stiffness of right knee, not elsewhere classified: Secondary | ICD-10-CM

## 2015-12-11 DIAGNOSIS — R531 Weakness: Secondary | ICD-10-CM

## 2015-12-11 DIAGNOSIS — R2689 Other abnormalities of gait and mobility: Secondary | ICD-10-CM
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
## 2015-12-11 NOTE — Telephone Encounter (Signed)
Faxed letter to pts work [phone] per pt request.

## 2015-12-11 NOTE — Patient Instructions (Addendum)
  Quad Set    With other leg bent, foot flat, slowly tighten muscles on thigh of straight leg and tighten buttocks while counting out loud to __10__. Repeat with other leg. Repeat ___10_ times. Do __multiple__ sessions per day.   Heel Slide    Bend knee and pull heel toward buttocks. Hold __5-10__ seconds. Return. Repeat with other knee. Repeat __10__ times. Do _2-3___ sessions per day.  Can perform sitting as well.   Straight Leg Raise   Tighten stomach and slowly raise locked right leg __6-8__ inches from floor. Repeat __10__ times per set. Do _2___ sets per session. Do ___1_ sessions per day.  Hip Extension (Prone)   Lift left leg _2_ inches from floor, keeping knee locked. Repeat __10__ times per set. Do ____ sets per session. Do _1___ sessions per day.  Strengthening: Hip Abduction (Side2-Lying)   Tighten muscles on front of left thigh, then lift leg _6-8___ inches from surface, keeping knee locked.  Repeat _10___ times per set. Do _2___ sets per session. Do __1__ sessions per day. KNEE: Quadriceps - Prone    Place strap around ankle. Bring ankle toward buttocks. Press hip into surface. Hold _30__ seconds. _3__ reps per set, __2_ sets per day, _7__ days per week  [place] [facility], [place] [address]  [phone] (office) [phone] (fax)

## 2015-12-14 ENCOUNTER — Encounter: Payer: No Typology Code available for payment source | Admitting: Rehabilitative and Restorative Service Providers"

## 2015-12-14 ENCOUNTER — Encounter: Payer: Self-pay | Admitting: Family Medicine

## 2015-12-14 ENCOUNTER — Encounter: Payer: Self-pay | Admitting: Rehabilitative and Restorative Service Providers"

## 2015-12-14 ENCOUNTER — Ambulatory Visit (INDEPENDENT_AMBULATORY_CARE_PROVIDER_SITE_OTHER): Payer: No Typology Code available for payment source | Admitting: Rehabilitative and Restorative Service Providers"

## 2015-12-14 DIAGNOSIS — R2689 Other abnormalities of gait and mobility: Secondary | ICD-10-CM | POA: Diagnosis not present

## 2015-12-14 DIAGNOSIS — M25661 Stiffness of right knee, not elsewhere classified: Secondary | ICD-10-CM | POA: Diagnosis not present

## 2015-12-14 DIAGNOSIS — M25561 Pain in right knee: Secondary | ICD-10-CM

## 2015-12-14 DIAGNOSIS — R531 Weakness: Secondary | ICD-10-CM

## 2015-12-14 NOTE — Therapy (Signed)
[place], [place], [address] Phone: [phone]   Fax:  [phone]  Physical Therapy Treatment  Patient Details  Name: Albert Hogan MRN: [id number] Date of Birth: 06-01-1971 Referring Provider: Georgina Snell  Encounter Date: 12/14/2015      PT End of Session - 12/14/15 0740    Visit Number 2   Number of Visits 12   Date for PT Re-Evaluation 01/23/16   PT Start Time 0735   PT Stop Time 0821   PT Time Calculation (min) 46 min   Activity Tolerance Patient tolerated treatment well      Past Medical History  Diagnosis Date  . Hypertension   . ED (erectile dysfunction)     Past Surgical History  Procedure Laterality Date  . Hernia repair    . Appendectomy  2001    There were no vitals filed for this visit.      Subjective Assessment - 12/14/15 0741    Subjective No trouble with exercises. Working on gait and that feels better with the crutch adjustments and gait training from last week.    Currently in Pain? No/denies            [facility] PT Assessment - 12/14/15 0001    Assessment   Medical Diagnosis meniscus surgery    Referring Provider Georgina Snell   Onset Date/Surgical Date 11/27/15   Hand Dominance Right   Next MD Visit 5/17   AROM   Right Knee Extension 5   Right Knee Flexion 120                     [facility] Adult PT Treatment/Exercise - 12/14/15 0001    Ambulation/Gait   Gait Comments ambulation with axillary crutches NWB Rt LE; adjusted height of crutches - instructed pt in 3 pt gait pattern NWB Rt LE   Therapeutic Activites    Therapeutic Activities --  myofacial release with stick and 4 in ball    Exercises   Exercises Knee/Hip   Knee/Hip Exercises: Stretches   Quad Stretch Right;4 reps;30 seconds   Knee/Hip Exercises: Aerobic   Stationary Bike ROM only partial revolution x5 min    Knee/Hip Exercises: Seated   Other Seated Knee/Hip Exercises seated scoots to  increased Rt knee flexion x 5 reps   Knee/Hip Exercises: Supine   Quad Sets Strengthening;Right;1 set;5 reps  10 sec    Heel Slides Right;1 set;5 reps   Hip Adduction Isometric Strengthening;Both;10 reps   Straight Leg Raises Strengthening;Right;1 set;10 reps   Straight Leg Raise with External Rotation Strengthening;Right  1 rep, too difficult this visit   Other Supine Knee/Hip Exercises hip abduction Lt with green TB above knees x 10    Knee/Hip Exercises: Sidelying   Hip ABduction Strengthening;Right;1 set;10 reps   Vasopneumatic   Number Minutes Vasopneumatic  15 minutes   Vasopnuematic Location  Knee   Vasopneumatic Pressure Medium   Vasopneumatic Temperature  1*                PT Education - 12/14/15 0802    Education provided Yes   Education Details HEP    Person(s) Educated Patient   Methods Explanation;Demonstration;Tactile cues;Verbal cues;Handout   Comprehension Verbalized understanding;Returned demonstration;Verbal cues required;Tactile cues required             PT Long Term Goals - 12/14/15 0824    PT LONG TERM GOAL #1   Title Rt knee ROM 0 deg extension to 125  deg flexion 01/23/16   Time 6   Period Weeks   Status On-going   PT LONG TERM GOAL #2   Title 5/5 strength Rt LE 01/23/16   Time 6   Period Weeks   Status On-going   PT LONG TERM GOAL #3   Title Progress to normal gait pattern without assistive device as post op protcol allows 01/23/16   Time 6   Period Weeks   Status On-going   PT LONG TERM GOAL #4   Title I in HEP and return to gym  01/23/16   Time 6   Period Weeks   Status On-going   PT LONG TERM GOAL #5   Title Improve FOTO to </= 42 % limitation 01/23/16   Time 6   Period Weeks   Status On-going               Plan - 12/14/15 [id number]    Clinical Impression Statement Excellent gains in ROM. Progressing well with rehab goals    PT Frequency 2x / week   PT Duration 6 weeks   PT Treatment/Interventions Patient/family  education;ADLs/Self Care Home Management;Therapeutic exercise;Therapeutic activities;Neuromuscular re-education;Balance training;Manual techniques;Dry needling;Cryotherapy;Electrical Stimulation;Iontophoresis 4mg /ml Dexamethasone;Moist Heat;Ultrasound   PT Next Visit Plan progress with ROM and LE stengthening    PT Home Exercise Plan HEP - to use TENS unit which he already has to assist with pain management    Consulted and Agree with Plan of Care Patient      Patient will benefit from skilled therapeutic intervention in order to improve the following deficits and impairments:  Improper body mechanics, Decreased range of motion, Decreased mobility, Decreased strength, Abnormal gait, Pain, Decreased activity tolerance  Visit Diagnosis: Knee pain, acute, right  Stiffness of right knee, not elsewhere classified  Other abnormalities of gait and mobility  Weakness generalized     Problem List Patient Active Problem List   Diagnosis Date Noted  . Osteochondral defect of femoral condyle 12/11/2015  . Calf swelling 12/09/2015  . Acute lateral meniscus tear of right knee 07/28/2015  . Erectile dysfunction 10/14/2014  . Scrotal mass 10/24/2013  . Hyperlipidemia 09/18/2013  . RLQ abdominal pain 09/17/2013  . Annual physical exam 09/17/2013  . Exposure to STD 09/17/2013  . Degenerative disc disease, lumbar 05/15/2013  . Left carpal tunnel syndrome 12/27/2012  . Cherry angioma 12/27/2012  . Essential hypertension, benign 12/18/2008    Andreya Lacks Nilda Simmer PT, MPH  12/14/2015, 8:26 AM  [facility] [place], [place], [address] Phone: [phone]   Fax:  [phone]  Name: Helge Delzell MRN: [id number] Date of Birth: 1971/07/04

## 2015-12-14 NOTE — Patient Instructions (Signed)
"  The Stick" or imitation is at [employer]   Self massage with about 4 in rubber ball  Sit with foot planted on the floor and scoot forward to increase bend in the knee   Strengthening: Hip Adduction - Isometric    With ball or folded pillow between knees, squeeze knees together. Hold _5-10___ seconds. Repeat __10__ times per set. Do __1-3__ sets per session. Do _1-2___ sessions per day.    Strengthening: Hip Abductor - Resisted    With band looped around both legs above knees, push thighs apart. Repeat __10__ times per set. Do _1-3___ sets per session. Do _1-2___ sessions per day.

## 2015-12-16 ENCOUNTER — Ambulatory Visit (INDEPENDENT_AMBULATORY_CARE_PROVIDER_SITE_OTHER): Payer: No Typology Code available for payment source | Admitting: Physical Therapy

## 2015-12-16 ENCOUNTER — Encounter: Payer: Self-pay | Admitting: Physical Therapy

## 2015-12-16 DIAGNOSIS — R531 Weakness: Secondary | ICD-10-CM

## 2015-12-16 DIAGNOSIS — R2689 Other abnormalities of gait and mobility: Secondary | ICD-10-CM

## 2015-12-16 DIAGNOSIS — M25661 Stiffness of right knee, not elsewhere classified: Secondary | ICD-10-CM | POA: Diagnosis not present

## 2015-12-16 DIAGNOSIS — M25561 Pain in right knee: Secondary | ICD-10-CM

## 2015-12-16 NOTE — Therapy (Signed)
[place], [place], [address] Phone: [phone]   Fax:  [phone]  Physical Therapy Treatment  Patient Details  Name: Albert Hogan MRN: [id number] Date of Birth: [birth year] Referring Provider: Georgina Snell  Encounter Date: 12/16/2015      PT End of Session - 12/16/15 [id number]    Visit Number 3   Number of Visits 12   Date for PT Re-Evaluation 01/23/16   PT Start Time [id number]   PT Stop Time 1116   PT Time Calculation (min) 62 min   Activity Tolerance Patient tolerated treatment well      Past Medical History  Diagnosis Date  . Hypertension   . ED (erectile dysfunction)     Past Surgical History  Procedure Laterality Date  . Hernia repair    . Appendectomy  2001    There were no vitals filed for this visit.      Subjective Assessment - 12/16/15 1019    Subjective Pt doing pretty good,   Currently in Pain? Yes   Pain Score 2    Pain Location Knee   Pain Orientation Right   Pain Descriptors / Indicators Aching   Pain Type Surgical pain   Pain Onset More than a month ago   Pain Frequency Intermittent   Aggravating Factors  long periods of immobility   Pain Relieving Factors ice and meds                         [facility] Adult PT Treatment/Exercise - 12/16/15 0001    Exercises   Exercises Knee/Hip   Knee/Hip Exercises: Stretches   Passive Hamstring Stretch Right;30 seconds   Piriformis Stretch Right;1 rep;30 seconds  Modified Standing Rt knee flexion stretch with foot on high step   Knee/Hip Exercises: Aerobic   Stationary Bike ROM - able to make full revolutions today   Knee/Hip Exercises: Seated   Long Arc Quad Strengthening;Right  2x10, 5 sec hold, eccentric lowering   Heel Slides Strengthening;Right;15 reps  red band   Knee/Hip Exercises: Sidelying   Hip ADduction Strengthening;Right;3 sets  8 reps with top leg on chair   Other Sidelying Knee/Hip Exercises  pilates work with leg FWD.BWD, then heel/toe taps with arc   Modalities   Modalities Designer, multimedia Location Rt knee    Electrical Stimulation Action ion repelling   Electrical Stimulation Parameters to tolerance   Electrical Stimulation Goals Edema;Pain   Vasopneumatic   Number Minutes Vasopneumatic  15 minutes   Vasopnuematic Location  Knee   Vasopneumatic Pressure Medium   Vasopneumatic Temperature  2*                     PT Long Term Goals - 12/14/15 0824    PT LONG TERM GOAL #1   Title Rt knee ROM 0 deg extension to 125 deg flexion 01/23/16   Time 6   Period Weeks   Status On-going   PT LONG TERM GOAL #2   Title 5/5 strength Rt LE 01/23/16   Time 6   Period Weeks   Status On-going   PT LONG TERM GOAL #3   Title Progress to normal gait pattern without assistive device as post op protcol allows 01/23/16   Time 6   Period Weeks   Status On-going   PT LONG TERM GOAL #4   Title I in HEP and return to gym  01/23/16   Time 6   Period Weeks   Status On-going   PT LONG TERM GOAL #5   Title Improve FOTO to </= 42 % limitation 01/23/16   Time 6   Period Weeks   Status On-going               Plan - 12/16/15 1149    Clinical Impression Statement Kessler tolerated treatment well, did fatgue with ther ex/strengthening exercises.    Rehab Potential Good   PT Frequency 2x / week   PT Duration 6 weeks   PT Treatment/Interventions Patient/family education;ADLs/Self Care Home Management;Therapeutic exercise;Therapeutic activities;Neuromuscular re-education;Balance training;Manual techniques;Dry needling;Cryotherapy;Electrical Stimulation;Iontophoresis 4mg /ml Dexamethasone;Moist Heat;Ultrasound   PT Next Visit Plan add in UBE for endurance    Consulted and Agree with Plan of Care Patient      Patient will benefit from skilled therapeutic intervention in order to improve the following deficits  and impairments:  Improper body mechanics, Decreased range of motion, Decreased mobility, Decreased strength, Abnormal gait, Pain, Decreased activity tolerance  Visit Diagnosis: Knee pain, acute, right  Stiffness of right knee, not elsewhere classified  Other abnormalities of gait and mobility  Weakness generalized     Problem List Patient Active Problem List   Diagnosis Date Noted  . Osteochondral defect of femoral condyle 12/11/2015  . Calf swelling 12/09/2015  . Acute lateral meniscus tear of right knee 07/28/2015  . Erectile dysfunction 10/14/2014  . Scrotal mass 10/24/2013  . Hyperlipidemia 09/18/2013  . RLQ abdominal pain 09/17/2013  . Annual physical exam 09/17/2013  . Exposure to STD 09/17/2013  . Degenerative disc disease, lumbar 05/15/2013  . Left carpal tunnel syndrome 12/27/2012  . Cherry angioma 12/27/2012  . Essential hypertension, benign 12/18/2008    Jeral Pinch PT 12/16/2015, 11:51 AM  [facility] [place], [place], [address] Phone: [phone]   Fax:  [phone]  Name: Albert Hogan MRN: [id number] Date of Birth: 04-02-1971

## 2015-12-17 ENCOUNTER — Ambulatory Visit (INDEPENDENT_AMBULATORY_CARE_PROVIDER_SITE_OTHER): Payer: No Typology Code available for payment source | Admitting: Family Medicine

## 2015-12-17 ENCOUNTER — Encounter: Payer: Self-pay | Admitting: Family Medicine

## 2015-12-17 VITALS — BP 123/83 | HR 90 | Wt 250.0 lb

## 2015-12-17 DIAGNOSIS — Z0000 Encounter for general adult medical examination without abnormal findings: Secondary | ICD-10-CM

## 2015-12-17 MED ORDER — EPINEPHRINE 0.3 MG/0.3ML IJ SOAJ: 0.3000 mg | Freq: Once | INTRAMUSCULAR | Status: DC

## 2015-12-17 MED ORDER — OLMESARTAN MEDOXOMIL-HCTZ 40-12.5 MG PO TABS: 1.0000 | ORAL_TABLET | Freq: Every day | ORAL | Status: DC

## 2015-12-17 NOTE — Progress Notes (Signed)
CC: Albert Hogan is a 45 y.o. male is here for Annual Exam   Subjective: HPI:  Colonoscopy:No current indication. Prostate: Discussed screening risks/beneifts with patient today,plans are to start PSA at age 42  Influenza Vaccine: no current indication Pneumovax: no current indication Td/Tdap: UTD Zoster: (Start 44 yo)  Requesting complete physical exam of refills on EpiPen and blood pressure medication. He would like an HIV test but does not know of any known exposure.   Review of Systems - General ROS: negative for - chills, fever, night sweats, weight gain or weight loss Ophthalmic ROS: negative for - decreased vision Psychological ROS: negative for - anxiety or depression ENT ROS: negative for - hearing change, nasal congestion, tinnitus or allergies Hematological and Lymphatic ROS: negative for - bleeding problems, bruising or swollen lymph nodes Breast ROS: negative Respiratory ROS: no cough, shortness of breath, or wheezing Cardiovascular ROS: no chest pain or dyspnea on exertion Gastrointestinal ROS: no abdominal pain, change in bowel habits, or black or bloody stools Genito-Urinary ROS: negative for - genital discharge, genital ulcers, incontinence or abnormal bleeding from genitals Musculoskeletal ROS: negative for - joint pain or muscle pain Neurological ROS: negative for - headaches or memory loss Dermatological ROS: negative for lumps, mole changes, rash and skin lesion changes  Past Medical History  Diagnosis Date  . Hypertension   . ED (erectile dysfunction)     Past Surgical History  Procedure Laterality Date  . Hernia repair    . Appendectomy  2001   Family History  Problem Relation Age of Onset  . Hypertension Mother   . Thyroid disease Mother   . Hypertension Father     Social History   Social History  . Marital Status: Single    Spouse Name: N/A  . Number of Children: N/A  . Years of Education: N/A   Occupational History  . Not on file.    Social History Main Topics  . Smoking status: Never Smoker   . Smokeless tobacco: Not on file  . Alcohol Use: Not on file  . Drug Use: No  . Sexual Activity: Not on file   Other Topics Concern  . Not on file   Social History Narrative     Objective: BP 123/83 mmHg  Pulse 90  Wt 250 lb (113.399 kg)  General: No Acute Distress HEENT: Atraumatic, normocephalic, conjunctivae normal without scleral icterus.  No nasal discharge, hearing grossly intact, TMs with good landmarks bilaterally with no middle ear abnormalities, posterior pharynx clear without oral lesions. Neck: Supple, trachea midline, no cervical nor supraclavicular adenopathy. Pulmonary: Clear to auscultation bilaterally without wheezing, rhonchi, nor rales. Cardiac: Regular rate and rhythm.  No murmurs, rubs, nor gallops. No peripheral edema.  2+ peripheral pulses bilaterally. Abdomen: Bowel sounds normal.  No masses.  Non-tender without rebound.  Negative Murphy's sign.. MSK: Grossly intact, no signs of weakness.  Full strength throughout upper and lower extremities.  Full ROM in upper and lower extremities.  No midline spinal tenderness. Neuro: Gait unremarkable, CN II-XII grossly intact.  C5-C6 Reflex 2/4 Bilaterally, L4 Reflex 2/4 Bilaterally.  Cerebellar function intact. Skin: No rashes. Psych: Alert and oriented to person/place/time.  Thought process normal. No anxiety/depression.  Assessment & Plan: [name] was seen today for annual exam.  Diagnoses and all orders for this visit:  Annual physical exam -     Lipid panel -     COMPLETE METABOLIC PANEL WITH GFR -     CBC -  HIV antibody  Other orders -     olmesartan-hydrochlorothiazide (BENICAR HCT) 40-12.5 MG tablet; Take 1 tablet by mouth daily. -     EPINEPHrine (EPIPEN 2-PAK) 0.3 mg/0.3 mL IJ SOAJ injection; Inject 0.3 mLs (0.3 mg total) into the muscle once.  Healthy lifestyle interventions including but not limited to regular exercise, a healthy  low fat diet, moderation of salt intake, the dangers of tobacco/alcohol/recreational drug use, nutrition supplementation, and accident avoidance were discussed with the patient and a handout was provided for future reference.  Return in about 3 months (around 03/18/2016) for BP follow up.

## 2015-12-18 LAB — LIPID PANEL
CHOL/HDL RATIO: 4 ratio (ref <=5.0)
Cholesterol: 182 mg/dL (ref 125–200)
HDL: 46 mg/dL (ref >=40)
LDL CALC: 117 mg/dL (ref <130)
TRIGLYCERIDES: 95 mg/dL (ref <150)
VLDL: 19 mg/dL (ref <30)

## 2015-12-18 LAB — CBC
HCT: 41.6 % (ref 38.5–50.0)
Hemoglobin: 14 g/dL (ref 13.2–17.1)
MCH: 29.3 pg (ref 27.0–33.0)
MCHC: 33.7 g/dL (ref 32.0–36.0)
MCV: 87 fL (ref 80.0–100.0)
MPV: 12.1 fL (ref 7.5–12.5)
PLATELETS: 209 10*3/uL (ref 140–400)
RBC: 4.78 MIL/uL (ref 4.20–5.80)
RDW: 14.5 % (ref 11.0–15.0)
WBC: 5.9 10*3/uL (ref 3.8–10.8)

## 2015-12-18 LAB — COMPLETE METABOLIC PANEL WITH GFR
ALK PHOS: 64 U/L (ref 40–115)
ALT: 20 U/L (ref 9–46)
AST: 19 U/L (ref 10–40)
Albumin: 4.3 g/dL (ref 3.6–5.1)
BILIRUBIN TOTAL: 0.4 mg/dL (ref 0.2–1.2)
BUN: 19 mg/dL (ref 7–25)
CO2: 28 mmol/L (ref 20–31)
Calcium: 9.2 mg/dL (ref 8.6–10.3)
Chloride: 100 mmol/L (ref 98–110)
Creat: 1.13 mg/dL (ref 0.60–1.35)
GFR, EST NON AFRICAN AMERICAN: 79 mL/min (ref >=60)
GLUCOSE: 85 mg/dL (ref 65–99)
POTASSIUM: 4.2 mmol/L (ref 3.5–5.3)
SODIUM: 137 mmol/L (ref 135–146)
Total Protein: 7.2 g/dL (ref 6.1–8.1)

## 2015-12-18 LAB — HIV ANTIBODY (ROUTINE TESTING W REFLEX): HIV: NONREACTIVE

## 2015-12-22 ENCOUNTER — Encounter: Payer: Self-pay | Admitting: Physical Therapy

## 2015-12-22 ENCOUNTER — Ambulatory Visit (INDEPENDENT_AMBULATORY_CARE_PROVIDER_SITE_OTHER): Payer: No Typology Code available for payment source | Admitting: Physical Therapy

## 2015-12-22 DIAGNOSIS — R531 Weakness: Secondary | ICD-10-CM | POA: Diagnosis not present

## 2015-12-22 DIAGNOSIS — M25661 Stiffness of right knee, not elsewhere classified: Secondary | ICD-10-CM | POA: Diagnosis not present

## 2015-12-22 DIAGNOSIS — M25561 Pain in right knee: Secondary | ICD-10-CM | POA: Diagnosis not present

## 2015-12-22 DIAGNOSIS — R2689 Other abnormalities of gait and mobility: Secondary | ICD-10-CM

## 2015-12-22 NOTE — Therapy (Signed)
[place], [place], [address] Phone: [phone]   Fax:  [phone]  Physical Therapy Treatment  Patient Details  Name: Albert Hogan MRN: [id number] Date of Birth: June 29, 1971 Referring Provider: Dr Georgina Snell  Encounter Date: 12/22/2015      PT End of Session - 12/22/15 0938    Visit Number 4   Number of Visits 12   Date for PT Re-Evaluation 01/23/16   PT Start Time 0938   PT Stop Time 1035   PT Time Calculation (min) 57 min   Activity Tolerance Patient tolerated treatment well      Past Medical History  Diagnosis Date  . Hypertension   . ED (erectile dysfunction)     Past Surgical History  Procedure Laterality Date  . Hernia repair    . Appendectomy  2001    There were no vitals filed for this visit.      Subjective Assessment - 12/22/15 0939    Subjective Pt was sore after the last visit. He returned to work yesterday, had some swelling last night and was very tired.    Currently in Pain? No/denies            [facility] PT Assessment - 12/22/15 0001    Assessment   Medical Diagnosis meniscus surgery    Referring Provider Dr Georgina Snell   Onset Date/Surgical Date 11/27/15   Hand Dominance Right   Next MD Visit 5/17   AROM   Right Knee Extension -1   Right Knee Flexion 123                     [facility] Adult PT Treatment/Exercise - 12/22/15 0001    Knee/Hip Exercises: Stretches   Other Knee/Hip Stretches self mobs Rt knee, supine scooting down   Knee/Hip Exercises: Aerobic   Stationary Bike ROM - able to make full revolutions today   Knee/Hip Exercises: Supine   Quad Sets Strengthening;Right;10 reps  10 sec holds   Straight Leg Raises Strengthening;Right  in long sit x 12, then with ER    Modalities   Modalities Electrical Stimulation;Vasopneumatic;Ultrasound   Acupuncturist Location Rt knee   Risk analyst Stimulation Parameters to tolerance   Electrical Stimulation Goals Edema;Pain   Ultrasound   Ultrasound Location medial tib plat Rt knee   Ultrasound Parameters 50%, 3.3 mHz 1.0w/cm2   Ultrasound Goals Pain   Vasopneumatic   Number Minutes Vasopneumatic  15 minutes   Vasopnuematic Location  Knee   Vasopneumatic Pressure Medium   Vasopneumatic Temperature  1.5*                     PT Long Term Goals - 12/22/15 0941    PT LONG TERM GOAL #1   Title Rt knee ROM 0 deg extension to 125 deg flexion 01/23/16   Time 6   Period Weeks   Status Partially Met   PT LONG TERM GOAL #2   Title 5/5 strength Rt LE 01/23/16   Time 6   Period Weeks   Status On-going   PT LONG TERM GOAL #3   Title Progress to normal gait pattern without assistive device as post op protcol allows 01/23/16   Time 6   Period Weeks   Status On-going   PT LONG TERM GOAL #4   Title I in HEP and return to gym  01/23/16   Time 6  Period Weeks   Status On-going   PT LONG TERM GOAL #5   Title Improve FOTO to </= 42 % limitation 01/23/16   Time 6   Period Weeks   Status On-going               Plan - 12/22/15 1028    Clinical Impression Statement Albert Hogan's ROM continues to improve.  He does still have quad weakness and decreased tolerance to activity. Progressing to goals.    Rehab Potential Good   PT Frequency 2x / week   PT Duration 6 weeks   PT Treatment/Interventions Patient/family education;ADLs/Self Care Home Management;Therapeutic exercise;Therapeutic activities;Neuromuscular re-education;Balance training;Manual techniques;Dry needling;Cryotherapy;Electrical Stimulation;Iontophoresis 37m/ml Dexamethasone;Moist Heat;Ultrasound   PT Next Visit Plan sees MD next week, will hopefully be WBAT then so we can progress.   Consulted and Agree with Plan of Care Patient      Patient will benefit from skilled therapeutic intervention in order to improve the following deficits and  impairments:  Improper body mechanics, Decreased range of motion, Decreased mobility, Decreased strength, Abnormal gait, Pain, Decreased activity tolerance  Visit Diagnosis: Knee pain, acute, right  Stiffness of right knee, not elsewhere classified  Other abnormalities of gait and mobility  Weakness generalized     Problem List Patient Active Problem List   Diagnosis Date Noted  . Osteochondral defect of femoral condyle 12/11/2015  . Calf swelling 12/09/2015  . Acute lateral meniscus tear of right knee 07/28/2015  . Erectile dysfunction 10/14/2014  . Scrotal mass 10/24/2013  . Hyperlipidemia 09/18/2013  . RLQ abdominal pain 09/17/2013  . Annual physical exam 09/17/2013  . Exposure to STD 09/17/2013  . Degenerative disc disease, lumbar 05/15/2013  . Left carpal tunnel syndrome 12/27/2012  . Cherry angioma 12/27/2012  . Essential hypertension, benign 12/18/2008    SJeral PinchPT 12/22/2015, 10:29 AM  [facility] [address]Phone: [phone]  Fax:  [phone] Name: Albert PollanMRN: [id number] of Birth: [birth year]

## 2015-12-25 ENCOUNTER — Encounter: Payer: Self-pay | Admitting: Family Medicine

## 2015-12-28 ENCOUNTER — Ambulatory Visit (INDEPENDENT_AMBULATORY_CARE_PROVIDER_SITE_OTHER): Payer: No Typology Code available for payment source | Admitting: Physical Therapy

## 2015-12-28 DIAGNOSIS — M25561 Pain in right knee: Secondary | ICD-10-CM | POA: Diagnosis not present

## 2015-12-28 DIAGNOSIS — M25661 Stiffness of right knee, not elsewhere classified: Secondary | ICD-10-CM

## 2015-12-28 DIAGNOSIS — R531 Weakness: Secondary | ICD-10-CM | POA: Diagnosis not present

## 2015-12-28 DIAGNOSIS — R2689 Other abnormalities of gait and mobility: Secondary | ICD-10-CM | POA: Diagnosis not present

## 2015-12-28 NOTE — Therapy (Signed)
[place], [place], [address] Phone: [phone]   Fax:  [phone]  Physical Therapy Treatment  Patient Details  Name: Albert Hogan MRN: [id number] Date of Birth: 11-01-1970 Referring Provider: Dr. Georgina Hogan   Encounter Date: 12/28/2015      PT End of Session - 12/28/15 0740    Visit Number 5   Number of Visits 12   Date for PT Re-Evaluation 01/23/16   PT Start Time 0702   PT Stop Time 0752   PT Time Calculation (min) 50 min      Past Medical History  Diagnosis Date  . Hypertension   . ED (erectile dysfunction)     Past Surgical History  Procedure Laterality Date  . Hernia repair    . Appendectomy  2001    There were no vitals filed for this visit.      Subjective Assessment - 12/28/15 0703    Subjective Pt feels his ROM in knee has gotten better.  Pt reports his knee continues to swell; he ices to help with this. Pt ambulates into therapy with crutches, NWB with RLE.     Currently in Pain? No/denies            [facility] PT Assessment - 12/28/15 0001    Assessment   Medical Diagnosis meniscus surgery    Referring Provider Albert Hogan    Onset Date/Surgical Date 11/27/15   Hand Dominance Right   Next MD Visit 12/29/15   AROM   Right Knee Extension -1   Right Knee Flexion 132          [facility] Adult PT Treatment/Exercise - 12/28/15 0001    Knee/Hip Exercises: Stretches   Passive Hamstring Stretch Right;30 seconds   Knee/Hip Exercises: Aerobic   Stationary Bike L1: 3 min    Knee/Hip Exercises: Seated   Other Seated Knee/Hip Exercises seated scoots to increased Rt knee flexion x 5 reps   Knee/Hip Exercises: Supine   Heel Slides Right;1 set;5 reps   Straight Leg Raise with External Rotation Right;10 reps;2 sets;Left  2nd set with hip abd/ adduct   Other Supine Knee/Hip Exercises bridge with ball squeeze x 5 sec hold x 10    Knee/Hip Exercises: Sidelying   Hip ABduction  Right;1 set;10 reps;Left  heel taps front back with arc   Knee/Hip Exercises: Prone   Hip Extension Right;2 sets;10 reps  and Lt one set   Modalities   Modalities Programmer, systems Location Rt knee    Electrical Stimulation Action ion repelling    Electrical Stimulation Parameters to tolerance    Electrical Stimulation Goals Edema   Ultrasound   Ultrasound Location medial tib plateau Rt knee   Ultrasound Parameters 50%, 3.3 mHz, 1.0 w/cm2 x 8 min   Ultrasound Goals Pain   Vasopneumatic   Number Minutes Vasopneumatic  15 minutes   Vasopnuematic Location  Knee   Vasopneumatic Pressure Medium   Vasopneumatic Temperature  3 snowflakes                      PT Long Term Goals - 12/28/15 0805    PT LONG TERM GOAL #1   Title Rt knee ROM 0 deg extension to 125 deg flexion 01/23/16   Time 6   Period Weeks   Status Partially Met   PT LONG TERM GOAL #2   Title 5/5 strength Rt LE 01/23/16   Time  6   Period Weeks   Status On-going   PT LONG TERM GOAL #3   Title Progress to normal gait pattern without assistive device as post op protcol allows 01/23/16   Time 6   Period Weeks   Status On-going   PT LONG TERM GOAL #4   Title I in HEP and return to gym  01/23/16   Time 6   Status On-going   PT LONG TERM GOAL #5   Title Improve FOTO to </= 42 % limitation 01/23/16   Time 6   Period Weeks   Status On-going               Plan - 12/28/15 0807    Clinical Impression Statement Pt demonstrated improved Rt knee flexion ROM. Pt tolerated all exercises without increase in pain.  Pt has made progress towards established goals and will benefit from continued PT intervention to maximize safe, functional mobility independence.    Rehab Potential Good   PT Frequency 2x / week   PT Duration 6 weeks   PT Treatment/Interventions Patient/family education;ADLs/Self Care Home Management;Therapeutic  exercise;Therapeutic activities;Neuromuscular re-education;Balance training;Manual techniques;Dry needling;Cryotherapy;Electrical Stimulation;Iontophoresis 48m/ml Dexamethasone;Moist Heat;Ultrasound   PT Next Visit Plan Pt to see MD tomorrow, will await further instructions regarding WB restrictions and progress accordingly.    Consulted and Agree with Plan of Care Patient      Patient will benefit from skilled therapeutic intervention in order to improve the following deficits and impairments:  Improper body mechanics, Decreased range of motion, Decreased mobility, Decreased strength, Abnormal gait, Pain, Decreased activity tolerance  Visit Diagnosis: Knee pain, acute, right  Stiffness of right knee, not elsewhere classified  Other abnormalities of gait and mobility  Weakness generalized     Problem List Patient Active Problem List   Diagnosis Date Noted  . Osteochondral defect of femoral condyle 12/11/2015  . Calf swelling 12/09/2015  . Acute lateral meniscus tear of right knee 07/28/2015  . Erectile dysfunction 10/14/2014  . Scrotal mass 10/24/2013  . Hyperlipidemia 09/18/2013  . RLQ abdominal pain 09/17/2013  . Annual physical exam 09/17/2013  . Exposure to STD 09/17/2013  . Degenerative disc disease, lumbar 05/15/2013  . Left carpal tunnel syndrome 12/27/2012  . Cherry angioma 12/27/2012  . Essential hypertension, benign 12/18/2008   Albert Hogan 12/28/2015 11:52 AM  Albert Hogan: Albert Hogan  Fax:  [phone] Name: Albert MattyMRN: [id number] of Birth: [birth year]

## 2015-12-29 ENCOUNTER — Ambulatory Visit (INDEPENDENT_AMBULATORY_CARE_PROVIDER_SITE_OTHER): Payer: No Typology Code available for payment source | Admitting: Family Medicine

## 2015-12-29 ENCOUNTER — Encounter: Payer: Self-pay | Admitting: Family Medicine

## 2015-12-29 VITALS — BP 130/79 | HR 102 | Wt 254.0 lb

## 2015-12-29 DIAGNOSIS — S83281A Other tear of lateral meniscus, current injury, right knee, initial encounter: Secondary | ICD-10-CM | POA: Diagnosis not present

## 2015-12-29 NOTE — Progress Notes (Signed)
       Albert Hogan is a 45 y.o. male who presents to [facility] today for follow-up right knee surgery. Patient had arthroscopic partial meniscectomy chondroplasty and microfracture at an outside medical facility on April 12. He is here today for part of his post surgical care. He has been attending physical therapy and nonweightbearing status for his right leg. He feels well. He notes firm scars at the herbal scar especially the medial scar. He notes that mildly painful. He is frustrated with crutches and ready to resume weightbearing. His goal is to resume playing basketball recreationally.   Past Medical History  Diagnosis Date  . Hypertension   . ED (erectile dysfunction)    Past Surgical History  Procedure Laterality Date  . Hernia repair    . Appendectomy  2001   Social History  Substance Use Topics  . Smoking status: Never Smoker   . Smokeless tobacco: Not on file  . Alcohol Use: Not on file   family history includes Hypertension in his father and mother; Thyroid disease in his mother.  ROS as above Medications: Current Outpatient Prescriptions  Medication Sig Dispense Refill  . CIALIS 20 MG tablet TAKE ONE TABLET BY MOUTH ONCE DAILY AS NEEDED FOR  ERECTILE  DYSFUNCTION 10 tablet 0  . EPINEPHrine (EPIPEN 2-PAK) 0.3 mg/0.3 mL IJ SOAJ injection Inject 0.3 mLs (0.3 mg total) into the muscle once. 2 Device 0  . HYDROcodone-acetaminophen (NORCO/VICODIN) 5-325 MG tablet Take 1 tablet by mouth every 6 (six) hours as needed. 25 tablet 0  . olmesartan-hydrochlorothiazide (BENICAR HCT) 40-12.5 MG tablet Take 1 tablet by mouth daily. 90 tablet 1  . tadalafil (CIALIS) 20 MG tablet TAKE ONE TABLET BY MOUTH ONCE DAILY AS NEEDED FOR ERECTILE DYSFUCTION 10 tablet 0   No current facility-administered medications for this visit.   Allergies  Allergen Reactions  . Shellfish Allergy       Exam:  BP 130/79 mmHg  Pulse 102  Wt 254 lb (115.214 kg) Gen: Well NAD Right knee relatively normal appearing without significant effusion. Decreased quad bulk right leg compared to left. Normal knee motion. Firm nontender portal scars present medially and laterally. No erythema or exudate.   No results found for this or any previous visit (from the past 24 hour(s)). No results found.   45 year old male about 4 weeks status post right knee arthroscopic surgery. Continue nonweightbearing status until 6 weeks postop (May 24).  Continue physical therapy. Recheck in 3 weeks. If patient continues to have bothersome portal scar with scar hypertrophy would consider ultrasound guided hydrodissection of portal scars.

## 2015-12-29 NOTE — Patient Instructions (Signed)
Thank you for coming in today. Increase  Weight bearing on May 24th.  Return in 3-4 weeks.  Continue PT 1-2 x weekly.

## 2015-12-31 ENCOUNTER — Ambulatory Visit (INDEPENDENT_AMBULATORY_CARE_PROVIDER_SITE_OTHER): Payer: No Typology Code available for payment source | Admitting: Physical Therapy

## 2015-12-31 DIAGNOSIS — R2689 Other abnormalities of gait and mobility: Secondary | ICD-10-CM | POA: Diagnosis not present

## 2015-12-31 DIAGNOSIS — M25661 Stiffness of right knee, not elsewhere classified: Secondary | ICD-10-CM

## 2015-12-31 DIAGNOSIS — M25561 Pain in right knee: Secondary | ICD-10-CM

## 2015-12-31 DIAGNOSIS — R531 Weakness: Secondary | ICD-10-CM

## 2015-12-31 NOTE — Therapy (Signed)
[place], [place], [address] Phone: [phone]   Fax:  [phone]  Physical Therapy Treatment  Patient Details  Name: Albert Hogan MRN: [id number] Date of Birth: 02/18/1971 Referring Provider: Dr. Georgina Snell   Encounter Date: 12/31/2015      PT End of Session - 12/31/15 0932    Visit Number 6   Number of Visits 12   Date for PT Re-Evaluation 01/23/16   PT Start Time 0928   PT Stop Time 1025   PT Time Calculation (min) 57 min   Activity Tolerance Patient tolerated treatment well      Past Medical History  Diagnosis Date  . Hypertension   . ED (erectile dysfunction)     Past Surgical History  Procedure Laterality Date  . Hernia repair    . Appendectomy  2001    There were no vitals filed for this visit.      Subjective Assessment - 12/31/15 0932    Subjective Pt has one more week before he can begin WBAT.  He started upper body strengthening at home this week. Otherwise no new changes.    Currently in Pain? No/denies            [facility] PT Assessment - 12/31/15 0001    Assessment   Medical Diagnosis meniscus surgery    Referring Provider Dr. Georgina Snell    Onset Date/Surgical Date 11/27/15   Hand Dominance Right   Next MD Visit in 3 wks   Prior Therapy none   Flexibility   Quadriceps Prone- Rt heel 8" from buttock, Lt heel 3" from buttock                     [facility] Adult PT Treatment/Exercise - 12/31/15 0001    Knee/Hip Exercises: Stretches   Passive Hamstring Stretch Right;2 reps;30 seconds   Quad Stretch Right;Left;2 reps;30 seconds   Other Knee/Hip Stretches seated gastroc and soleus stretch with strap x 30 sec x 3 reps each    Other Knee/Hip Stretches supine ITB stretch and adductor stretch with strap   Knee/Hip Exercises: Aerobic   Stationary Bike L5: 6 min    Knee/Hip Exercises: Supine   Bridges Limitations Bridge with legs on ball and arms crossed x 10;    Single Leg Bridge Strengthening;Right;Left;1 set;5 reps (with legs on green ball)   Other Supine Knee/Hip Exercises green therapy ball pass from arms to/from legs for core engaged x 10    Knee/Hip Exercises: Sidelying   Hip ABduction Right;1 set;10 reps;Left  heel taps front back with arc   Hip ADduction Strengthening;Right;Left;1 set;10 reps   Other Sidelying Knee/Hip Exercises PRONE:  high plank x 30 sec x 2 reps    Knee/Hip Exercises: Prone   Hamstring Curl 2 sets;10 reps  5# at ankle, RLE   Hip Extension Strengthening;Right;1 set;10 reps   Modalities   Modalities Electrical Stimulation;Vasopneumatic   Electrical Stimulation   Electrical Stimulation Location Rt knee    Electrical Stimulation Action ion repelling    Electrical Stimulation Parameters to tolerance   Electrical Stimulation Goals Edema   Vasopneumatic   Number Minutes Vasopneumatic  15 minutes   Vasopnuematic Location  Knee   Vasopneumatic Pressure Medium   Vasopneumatic Temperature  3 snowflakes                      PT Long Term Goals - 12/28/15 0805    PT LONG TERM GOAL #1  Title Rt knee ROM 0 deg extension to 125 deg flexion 01/23/16   Time 6   Period Weeks   Status Partially Met   PT LONG TERM GOAL #2   Title 5/5 strength Rt LE 01/23/16   Time 6   Period Weeks   Status On-going   PT LONG TERM GOAL #3   Title Progress to normal gait pattern without assistive device as post op protcol allows 01/23/16   Time 6   Period Weeks   Status On-going   PT LONG TERM GOAL #4   Title I in HEP and return to gym  01/23/16   Time 6   Status On-going   PT LONG TERM GOAL #5   Title Improve FOTO to </= 42 % limitation 01/23/16   Time 6   Period Weeks   Status On-going               Plan - 12/31/15 1012    Clinical Impression Statement Pt tolerated all exercises wel, without any symptomsl. Pt continues with tight Rt Quad.  Progressing well towards established goals.    Rehab Potential Good   PT  Frequency 2x / week   PT Duration 6 weeks   PT Treatment/Interventions Patient/family education;ADLs/Self Care Home Management;Therapeutic exercise;Therapeutic activities;Neuromuscular re-education;Balance training;Manual techniques;Dry needling;Cryotherapy;Electrical Stimulation;Iontophoresis 22m/ml Dexamethasone;Moist Heat;Ultrasound   PT Next Visit Plan Continue progressive strengthening/ ROM for RLE.  NWB until 01/06/16.    Consulted and Agree with Plan of Care Patient      Patient will benefit from skilled therapeutic intervention in order to improve the following deficits and impairments:  Improper body mechanics, Decreased range of motion, Decreased mobility, Decreased strength, Abnormal gait, Pain, Decreased activity tolerance  Visit Diagnosis: Knee pain, acute, right  Stiffness of right knee, not elsewhere classified  Weakness generalized  Other abnormalities of gait and mobility     Problem List Patient Active Problem List   Diagnosis Date Noted  . Osteochondral defect of femoral condyle 12/11/2015  . Calf swelling 12/09/2015  . Acute lateral meniscus tear of right knee 07/28/2015  . Erectile dysfunction 10/14/2014  . Scrotal mass 10/24/2013  . Hyperlipidemia 09/18/2013  . RLQ abdominal pain 09/17/2013  . Annual physical exam 09/17/2013  . Exposure to STD 09/17/2013  . Degenerative disc disease, lumbar 05/15/2013  . Left carpal tunnel syndrome 12/27/2012  . Cherry angioma 12/27/2012  . Essential hypertension, benign 12/18/2008   JKerin Perna PTA 12/31/2015 10:19 AM  [facility] [address]Phone: [phone]  Fax:  [phone] Name: Albert MatousekMRN: [id number] of Birth: 1May 17, 1972

## 2016-01-04 ENCOUNTER — Encounter: Payer: No Typology Code available for payment source | Admitting: Rehabilitative and Restorative Service Providers"

## 2016-01-04 ENCOUNTER — Telehealth: Payer: Self-pay

## 2016-01-04 MED ORDER — TADALAFIL 20 MG PO TABS: ORAL_TABLET | ORAL | Status: DC

## 2016-01-04 NOTE — Telephone Encounter (Signed)
Patient request refill for Cialis 20 mg. # [phone] refills sent to pharmacy. Rhonda Cunningham,CMA

## 2016-01-07 ENCOUNTER — Ambulatory Visit (INDEPENDENT_AMBULATORY_CARE_PROVIDER_SITE_OTHER): Payer: No Typology Code available for payment source | Admitting: Physical Therapy

## 2016-01-07 ENCOUNTER — Encounter: Payer: No Typology Code available for payment source | Admitting: Physical Therapy

## 2016-01-07 DIAGNOSIS — M25561 Pain in right knee: Secondary | ICD-10-CM

## 2016-01-07 DIAGNOSIS — R2689 Other abnormalities of gait and mobility: Secondary | ICD-10-CM | POA: Diagnosis not present

## 2016-01-07 DIAGNOSIS — M25661 Stiffness of right knee, not elsewhere classified: Secondary | ICD-10-CM | POA: Diagnosis not present

## 2016-01-07 DIAGNOSIS — R531 Weakness: Secondary | ICD-10-CM | POA: Diagnosis not present

## 2016-01-07 NOTE — Therapy (Signed)
[place] [facility] [place], [place], [address] Phone: [phone]   Fax:  [phone]  Physical Therapy Treatment  Patient Details  Name: Albert Hogan MRN: [id number] Date of Birth: 1970/10/14 Referring Provider: Dr. Georgina Snell   Encounter Date: 01/07/2016      PT End of Session - 01/07/16 0935    Visit Number 7   Number of Visits 12   Date for PT Re-Evaluation 01/23/16   PT Start Time 0930   PT Stop Time 1034   PT Time Calculation (min) 64 min      Past Medical History  Diagnosis Date  . Hypertension   . ED (erectile dysfunction)     Past Surgical History  Procedure Laterality Date  . Hernia repair    . Appendectomy  2001    There were no vitals filed for this visit.      Subjective Assessment - 01/07/16 0935    Subjective Pt reports he has been released to weight bear on 5/24.  Used crutches on and off yesterday, had some increased pain up to 3/10.   Presents with no crutches into therapy today.  Main complaint stiffness.     Currently in Pain? No/denies            [facility] PT Assessment - 01/07/16 0001    Assessment   Medical Diagnosis meniscus surgery    Referring Provider Dr. Georgina Snell    Onset Date/Surgical Date 11/27/15   Hand Dominance Right   Prior Therapy none          [facility] Adult PT Treatment/Exercise - 01/07/16 0001    Knee/Hip Exercises: Stretches   Passive Hamstring Stretch Right;2 reps;30 seconds   Quad Stretch Right;Left;2 reps;30 seconds   Gastroc Stretch Right;3 reps;30 seconds   Soleus Stretch Right;2 reps;20 seconds   Knee/Hip Exercises: Aerobic   Stationary Bike L5: 7 min    Knee/Hip Exercises: Machines for Strengthening   Cybex Knee Extension RLE only 1 plate x 10 reps (noted pain in last 10 deg of ext), 2 plates x 10 in range of tolerance.    Total Gym Leg Press 7 plates: 10 reps with RLE,  2 sets   Knee/Hip Exercises: Standing   Heel Raises Both;2 sets;10 reps   Other Standing Knee Exercises weight shifting in semi-tandem stance x 20, and side to side weight shifts x 10    Knee/Hip Exercises: Prone   Hip Extension Strengthening;Right;1 set;10 reps   Modalities   Modalities Electrical Stimulation;Vasopneumatic   Electrical Stimulation   Electrical Stimulation Location Rt knee    Electrical Stimulation Action IFC   Electrical Stimulation Parameters to tolerance    Electrical Stimulation Goals Pain   Ultrasound   Ultrasound Location medial tibial plateau RLE   Ultrasound Parameters 50%, 3.3 mHz, 1.0 w/cm2, 8 min   Ultrasound Goals Pain   Vasopneumatic   Number Minutes Vasopneumatic  15 minutes   Vasopnuematic Location  Knee   Vasopneumatic Pressure Medium   Vasopneumatic Temperature  3 snowflakes    Manual Therapy   Manual therapy comments Rock tape applied to incision on Rt knee                     PT Long Term Goals - 12/28/15 0805    PT LONG TERM GOAL #1   Title Rt knee ROM 0 deg extension to 125 deg flexion 01/23/16   Time 6   Period Weeks   Status Partially Met  PT LONG TERM GOAL #2   Title 5/5 strength Rt LE 01/23/16   Time 6   Period Weeks   Status On-going   PT LONG TERM GOAL #3   Title Progress to normal gait pattern without assistive device as post op protcol allows 01/23/16   Time 6   Period Weeks   Status On-going   PT LONG TERM GOAL #4   Title I in HEP and return to gym  01/23/16   Time 6   Status On-going   PT LONG TERM GOAL #5   Title Improve FOTO to </= 42 % limitation 01/23/16   Time 6   Period Weeks   Status On-going               Plan - 01/07/16 1212    Clinical Impression Statement Pt arrived to therapy session without crutches ambulating with slight antalgic gait.  Pt tolerated all exercises well with minimal increase in pain with resisted knee extension at end range up to 3-4/10; resolved with rest.  Pt reported decrease of symptoms with use of vaso/estim at end of session.  Pt educated  on easing into FWB as tolerated as to not irritate tissue in knee, as well as exercises to avoid at this time in the gym.     Rehab Potential Good   PT Frequency 2x / week   PT Duration 6 weeks   PT Treatment/Interventions Patient/family education;ADLs/Self Care Home Management;Therapeutic exercise;Therapeutic activities;Neuromuscular re-education;Balance training;Manual techniques;Dry needling;Cryotherapy;Electrical Stimulation;Iontophoresis 68m/ml Dexamethasone;Moist Heat;Ultrasound   PT Next Visit Plan Continue progressive strengthening/ ROM for RLE.  Begin proprioceptive exercises, if tolerated.    Consulted and Agree with Plan of Care Patient      Patient will benefit from skilled therapeutic intervention in order to improve the following deficits and impairments:  Improper body mechanics, Decreased range of motion, Decreased mobility, Decreased strength, Abnormal gait, Pain, Decreased activity tolerance  Visit Diagnosis: Knee pain, acute, right  Stiffness of right knee, not elsewhere classified  Weakness generalized  Other abnormalities of gait and mobility     Problem List Patient Active Problem List   Diagnosis Date Noted  . Osteochondral defect of femoral condyle 12/11/2015  . Calf swelling 12/09/2015  . Acute lateral meniscus tear of right knee 07/28/2015  . Erectile dysfunction 10/14/2014  . Scrotal mass 10/24/2013  . Hyperlipidemia 09/18/2013  . RLQ abdominal pain 09/17/2013  . Annual physical exam 09/17/2013  . Exposure to STD 09/17/2013  . Degenerative disc disease, lumbar 05/15/2013  . Left carpal tunnel syndrome 12/27/2012  . Cherry angioma 12/27/2012  . Essential hypertension, benign 12/18/2008   JKerin Perna PTA 01/07/2016 12:19 PM  [address] [address]Phone: [phone]  Fax:  [phone] Name: Albert MccreeMRN: [id number] of Birth:  1Apr 08, 1972

## 2016-01-14 ENCOUNTER — Encounter: Payer: No Typology Code available for payment source | Admitting: Physical Therapy

## 2016-01-15 ENCOUNTER — Ambulatory Visit (INDEPENDENT_AMBULATORY_CARE_PROVIDER_SITE_OTHER): Payer: No Typology Code available for payment source | Admitting: Physical Therapy

## 2016-01-15 DIAGNOSIS — R531 Weakness: Secondary | ICD-10-CM

## 2016-01-15 DIAGNOSIS — M25561 Pain in right knee: Secondary | ICD-10-CM

## 2016-01-15 DIAGNOSIS — M25661 Stiffness of right knee, not elsewhere classified: Secondary | ICD-10-CM | POA: Diagnosis not present

## 2016-01-15 NOTE — Therapy (Signed)
[place], [place], [address] Phone: [phone]   Fax:  [phone]  Physical Therapy Treatment  Patient Details  Name: Albert Hogan MRN: [id number] Date of Birth: 05/25/1971 Referring Provider: Dr. Georgina Snell   Encounter Date: 01/15/2016      PT End of Session - 01/15/16 0809    Visit Number 8   Number of Visits 12   Date for PT Re-Evaluation 01/23/16   PT Start Time 0802   PT Stop Time 0848   PT Time Calculation (min) 46 min      Past Medical History  Diagnosis Date  . Hypertension   . ED (erectile dysfunction)     Past Surgical History  Procedure Laterality Date  . Hernia repair    . Appendectomy  2001    There were no vitals filed for this visit.      Subjective Assessment - 01/15/16 0811    Subjective Pt reports he has been massaging scar area and it is now not as painful.  Pt has cycled at gym 3x over last week, less strengthening exercises over holiday weekend.  Stiffness in Rt knee is less than last week.     Currently in Pain? No/denies            [facility] PT Assessment - 01/15/16 0001    Assessment   Medical Diagnosis meniscus surgery    Referring Provider Dr. Georgina Snell    Onset Date/Surgical Date 11/27/15   Hand Dominance Right   Prior Therapy none   ROM / Strength   AROM / PROM / Strength AROM;PROM;Strength   AROM   Right Knee Extension -1   Right Knee Flexion 132   Left Knee Extension 0   Left Knee Flexion 132   Strength   Right/Left Hip Right   Right Hip Flexion 5/5   Right Hip Extension 5/5   Right Hip ABduction 5/5   Right Hip ADduction --  5-/5   Right/Left Knee Right   Right Knee Flexion --  5-/5   Right Knee Extension 5/5   Flexibility   Hamstrings Rt 67deg , Lt 70 deg    Quadriceps Rt knee 125 deg flexion, Lt 127 deg flexion                     [facility] Adult PT Treatment/Exercise - 01/15/16 0001    Knee/Hip Exercises: Stretches   Passive Hamstring Stretch Right;Left;2 reps;30 seconds   Quad Stretch Right;Left;3 reps;30 seconds   Gastroc Stretch Right;Left;3 reps;30 seconds   Soleus Stretch Right;Left;2 reps;30 seconds   Knee/Hip Exercises: Aerobic   Stationary Bike L5: 5 min    Knee/Hip Exercises: Machines for Strengthening   Cybex Knee Extension RLE only: 2 plates x 10 reps, 3 plates x 10 reps (no pain through full ROM) - repeated  LLE only.    Total Gym Leg Press 7 plates: 10 reps with RLE   Other Machine hamstring curl 1 plate: 6 reps RLE only.    Knee/Hip Exercises: Standing   Other Standing Knee Exercises single leg squat to chair - then 5 mini squats (SLS)   Knee/Hip Exercises: Supine   Bridges Limitations eccentric hamstring curls with legs on green therapy ball x 5 reps (challenging), then concentric/eccentric curls x 5 reps    Single Leg Bridge Right;Left;1 set;5 reps  leg on green therapy ball           PT Long Term Goals - 01/15/16 1405  PT LONG TERM GOAL #1   Title Rt knee ROM 0 deg extension to 125 deg flexion 01/23/16   Time 6   Period Weeks   Status Partially Met   PT LONG TERM GOAL #2   Title 5/5 strength Rt LE 01/23/16   Time 6   Period Weeks   Status Partially Met   PT LONG TERM GOAL #3   Title Progress to normal gait pattern without assistive device as post op protcol allows 01/23/16   Time 6   Period Weeks   Status Achieved   PT LONG TERM GOAL #4   Title I in HEP and return to gym  01/23/16   Time 6   Period Weeks   Status On-going   PT LONG TERM GOAL #5   Title Improve FOTO to </= 42 % limitation 01/23/16   Time 6   Period Weeks   Status On-going               Plan - 01/15/16 1359    Clinical Impression Statement Pt demonstrating improved RLE strength.  Pt's ROM unchanged from last session.  Pt challenged with Rt hamstring exercises.  Pt tolerated all exercises without production of pain.  Pt making great gains towards remaining goals.    Rehab Potential Good   PT  Frequency 2x / week   PT Duration 6 weeks   PT Treatment/Interventions Patient/family education;ADLs/Self Care Home Management;Therapeutic exercise;Therapeutic activities;Neuromuscular re-education;Balance training;Manual techniques;Dry needling;Cryotherapy;Electrical Stimulation;Iontophoresis 40m/ml Dexamethasone;Moist Heat;Ultrasound   PT Next Visit Plan Assess goals and need for continuation of therapy vs d/c to HEP.   Add proprioceptive exercises to HEP. FOTO   Consulted and Agree with Plan of Care Patient      Patient will benefit from skilled therapeutic intervention in order to improve the following deficits and impairments:  Improper body mechanics, Decreased range of motion, Decreased mobility, Decreased strength, Abnormal gait, Pain, Decreased activity tolerance  Visit Diagnosis: Knee pain, acute, right  Stiffness of right knee, not elsewhere classified  Weakness generalized     Problem List Patient Active Problem List   Diagnosis Date Noted  . Osteochondral defect of femoral condyle 12/11/2015  . Calf swelling 12/09/2015  . Acute lateral meniscus tear of right knee 07/28/2015  . Erectile dysfunction 10/14/2014  . Scrotal mass 10/24/2013  . Hyperlipidemia 09/18/2013  . RLQ abdominal pain 09/17/2013  . Annual physical exam 09/17/2013  . Exposure to STD 09/17/2013  . Degenerative disc disease, lumbar 05/15/2013  . Left carpal tunnel syndrome 12/27/2012  . Cherry angioma 12/27/2012  . Essential hypertension, benign 12/18/2008   JKerin Perna PTA 01/15/2016 2:06 PM  [address] [address]Phone: [phone]  Fax:  [phone] Name: Albert SadaMRN: [id number] of Birth: [birth year]

## 2016-01-22 ENCOUNTER — Encounter: Payer: No Typology Code available for payment source | Admitting: Physical Therapy

## 2016-01-26 ENCOUNTER — Ambulatory Visit (INDEPENDENT_AMBULATORY_CARE_PROVIDER_SITE_OTHER): Payer: No Typology Code available for payment source | Admitting: Physical Therapy

## 2016-01-26 DIAGNOSIS — M25561 Pain in right knee: Secondary | ICD-10-CM

## 2016-01-26 DIAGNOSIS — M25661 Stiffness of right knee, not elsewhere classified: Secondary | ICD-10-CM | POA: Diagnosis not present

## 2016-01-26 DIAGNOSIS — R2689 Other abnormalities of gait and mobility: Secondary | ICD-10-CM

## 2016-01-26 DIAGNOSIS — R531 Weakness: Secondary | ICD-10-CM | POA: Diagnosis not present

## 2016-01-26 NOTE — Therapy (Addendum)
[place], [place], [address] Phone: [phone]   Fax:  [phone]  Physical Therapy Treatment  Patient Details  Name: Albert Hogan MRN: [id number] Date of Birth: 02/12/1971 Referring Provider: Dr. Georgina Snell   Encounter Date: 01/26/2016      PT End of Session - 01/26/16 1152    Visit Number 9   Number of Visits 12   Date for PT Re-Evaluation 01/23/16   PT Start Time 1188   PT Stop Time 1233   PT Time Calculation (min) 44 min   Activity Tolerance Patient tolerated treatment well;No increased pain      Past Medical History  Diagnosis Date  . Hypertension   . ED (erectile dysfunction)     Past Surgical History  Procedure Laterality Date  . Hernia repair    . Appendectomy  2001    There were no vitals filed for this visit.      Subjective Assessment - 01/26/16 1153    Subjective Pt was out of town on business in [place].  Had altitude sickness whole trip. Today he is finally starting to feel better. Rt knee has been ok, has some a lilttle bit of exercise since last visit. Had 4/10 pain with knee extension (resisted, 50#); resolved with rest.    Currently in Pain? No/denies            [facility] PT Assessment - 01/26/16 0001    Assessment   Medical Diagnosis meniscus surgery    Referring Provider Dr. Georgina Snell    Onset Date/Surgical Date 11/27/15   Prior Therapy none   Observation/Other Assessments   Focus on Therapeutic Outcomes (FOTO)  17% limited   AROM   Right Knee Extension 0   Right Knee Flexion 129   Strength   Right/Left Hip Right   Right Hip Flexion 5/5   Right Hip Extension 5/5   Right Hip ABduction 5/5   Right Hip ADduction 5/5   Right/Left Knee Right   Right Knee Flexion 5/5   Right Knee Extension 5/5   Flexibility   Hamstrings Rt 65 deg, Lt 62 deg           [facility] Adult PT Treatment/Exercise - 01/26/16 0001    Knee/Hip Exercises: Stretches   Passive Hamstring  Stretch Right;Left;2 reps;30 seconds   Quad Stretch Right;Left;3 reps;30 seconds   Piriformis Stretch Right;Left;2 reps;30 seconds   Gastroc Stretch Right;Left;3 reps;30 seconds   Soleus Stretch Right;Left;2 reps;30 seconds   Other Knee/Hip Stretches supine ITB stretch and adductor stretch with strap   Knee/Hip Exercises: Aerobic   Stationary Bike L6: 7 min    Knee/Hip Exercises: Machines for Strengthening   Cybex Knee Extension RLE only: 2 plates x 10 reps, 3 plates x 10 reps (no pain through full ROM) -repeated on LLE   Knee/Hip Exercises: Standing   Heel Raises 3 sets   Heel Raises Limitations 1st set, 2 ft. 2nd set RLE, 3rd set LLE    Other Standing Knee Exercises single leg squat (partial) x 8 reps RLE, 10 reps LLE     Reviewed current HEP and discussed any modifications that may be needed.        PT Long Term Goals - 01/26/16 1211    PT LONG TERM GOAL #1   Title Rt knee ROM 0 deg extension to 125 deg flexion 01/23/16   Time 6   Period Weeks   Status Achieved   PT LONG TERM GOAL #2  Title 5/5 strength Rt LE 01/23/16   Time 6   Period Weeks   Status Achieved   PT LONG TERM GOAL #3   Title Progress to normal gait pattern without assistive device as post op protcol allows 01/23/16   Time 6   Period Weeks   Status Achieved   PT LONG TERM GOAL #4   Title I in HEP and return to gym  01/23/16   Time 6   Period Weeks   Status Achieved   PT LONG TERM GOAL #5   Time 6   Period Weeks   Status Achieved               Plan - 01/26/16 1241    Clinical Impression Statement Pt tolerated all exercises without production of pain or symptoms.  Pt has met all goals and is satisfied with current level of function.  Pt requests to d/c to HEP.    Rehab Potential Good   PT Frequency 2x / week   PT Duration 6 weeks   PT Treatment/Interventions Patient/family education;ADLs/Self Care Home Management;Therapeutic exercise;Therapeutic activities;Neuromuscular re-education;Balance  training;Manual techniques;Dry needling;Cryotherapy;Electrical Stimulation;Iontophoresis 78m/ml Dexamethasone;Moist Heat;Ultrasound   PT Next Visit Plan Spoke to supervising PT; will d/c to HEP.     Consulted and Agree with Plan of Care Patient      Patient will benefit from skilled therapeutic intervention in order to improve the following deficits and impairments:  Improper body mechanics, Decreased range of motion, Decreased mobility, Decreased strength, Abnormal gait, Pain, Decreased activity tolerance  Visit Diagnosis: Knee pain, acute, right  Stiffness of right knee, not elsewhere classified  Weakness generalized  Other abnormalities of gait and mobility     Problem List Patient Active Problem List   Diagnosis Date Noted  . Osteochondral defect of femoral condyle 12/11/2015  . Calf swelling 12/09/2015  . Acute lateral meniscus tear of right knee 07/28/2015  . Erectile dysfunction 10/14/2014  . Scrotal mass 10/24/2013  . Hyperlipidemia 09/18/2013  . RLQ abdominal pain 09/17/2013  . Annual physical exam 09/17/2013  . Exposure to STD 09/17/2013  . Degenerative disc disease, lumbar 05/15/2013  . Left carpal tunnel syndrome 12/27/2012  . Cherry angioma 12/27/2012  . Essential hypertension, benign 12/18/2008   JKerin Perna PTA 01/26/2016 1:00 PM\  [address] [address]Phone: [phone]  Fax:  [phone] Name: Albert LileMRN: [id number] of Birth: [birth year]

## 2016-02-18 ENCOUNTER — Other Ambulatory Visit: Payer: Self-pay | Admitting: Family Medicine

## 2016-02-19 MED ORDER — HYDROCODONE-ACETAMINOPHEN 5-325 MG PO TABS: 1.0000 | ORAL_TABLET | Freq: Four times a day (QID) | ORAL | Status: DC | PRN

## 2016-03-24 ENCOUNTER — Encounter: Payer: Self-pay | Admitting: Family Medicine

## 2016-03-24 ENCOUNTER — Ambulatory Visit (INDEPENDENT_AMBULATORY_CARE_PROVIDER_SITE_OTHER): Payer: No Typology Code available for payment source | Admitting: Family Medicine

## 2016-03-24 VITALS — BP 134/91 | HR 96 | Wt 261.0 lb

## 2016-03-24 DIAGNOSIS — M25461 Effusion, right knee: Secondary | ICD-10-CM

## 2016-03-24 DIAGNOSIS — R52 Pain, unspecified: Secondary | ICD-10-CM

## 2016-03-24 DIAGNOSIS — L905 Scar conditions and fibrosis of skin: Secondary | ICD-10-CM

## 2016-03-24 DIAGNOSIS — M238X1 Other internal derangements of right knee: Secondary | ICD-10-CM

## 2016-03-24 NOTE — Patient Instructions (Signed)
Thank you for coming in today. Return in 1 month or sooner if needed.   Call or go to the ER if you develop a large red swollen joint with extreme pain or oozing puss.

## 2016-03-25 DIAGNOSIS — M25461 Effusion, right knee: Secondary | ICD-10-CM | POA: Insufficient documentation

## 2016-03-25 DIAGNOSIS — L905 Scar conditions and fibrosis of skin: Secondary | ICD-10-CM | POA: Insufficient documentation

## 2016-03-25 DIAGNOSIS — R52 Pain, unspecified: Secondary | ICD-10-CM

## 2016-03-25 DIAGNOSIS — M238X9 Other internal derangements of unspecified knee: Secondary | ICD-10-CM | POA: Insufficient documentation

## 2016-03-25 NOTE — Progress Notes (Signed)
Albert Hogan is a 45 y.o. male who presents to [facility] [place] today for follow-up right knee pain.  Several months ago patient had arthroscopic right knee surgery including microfracture. He did very well with physical therapy until a few weeks ago when he began having right knee swelling and pain. He notes diffuse swelling as well as pain at the medial knee near the port scar. He has a clicking sensation at the medial anterior knee with knee extension is painful. He denies any locking or catching. No radiating pain weakness or numbness. No treatment tried aside for some over-the-counter medications.   Past Medical History:  Diagnosis Date  . ED (erectile dysfunction)   . Hypertension    Past Surgical History:  Procedure Laterality Date  . APPENDECTOMY  2001  . HERNIA REPAIR     Social History  Substance Use Topics  . Smoking status: Never Smoker  . Smokeless tobacco: Not on file  . Alcohol use Not on file   family history includes Hypertension in his father and mother; Thyroid disease in his mother.  ROS as above:  Medications: Current Outpatient Prescriptions  Medication Sig Dispense Refill  . CIALIS 20 MG tablet TAKE ONE TABLET BY MOUTH ONCE DAILY AS NEEDED FOR  ERECTILE  DYSFUNCTION 10 tablet 0  . EPINEPHrine (EPIPEN 2-PAK) 0.3 mg/0.3 mL IJ SOAJ injection Inject 0.3 mLs (0.3 mg total) into the muscle once. 2 Device 0  . HYDROcodone-acetaminophen (NORCO/VICODIN) 5-325 MG tablet Take 1 tablet by mouth every 6 (six) hours as needed. 25 tablet 0  . olmesartan-hydrochlorothiazide (BENICAR HCT) 40-12.5 MG tablet Take 1 tablet by mouth daily. 90 tablet 1  . tadalafil (CIALIS) 20 MG tablet TAKE ONE TABLET BY MOUTH ONCE DAILY AS NEEDED FOR ERECTILE DYSFUCTION 10 tablet 0   No current facility-administered medications for this visit.    Allergies  Allergen Reactions    . Shellfish Allergy      Exam:  BP (!) 134/91   Pulse 96   Wt 261 lb (118.4 kg)   BMI 34.43 kg/m  Gen: Well NAD Right knee: Large effusion otherwise normal-appearing Tender palpation at the medial arthroscopic port scar. Palpable click with knee extension and area of pain just superior to the medial port scar overlying the anterior medial femur.  Stable ligamentous exam Negative McMurray's testing  Limited musculoskeletal ultrasound: Large joint effusion present Ultrasound probe placed overlying the palpable click at the medial superior knee. A band of tissue is seen to transition/snap over a femur spur. Patient noted this is the area of pain and this procedure reproduced his pain.  Procedure: Real-time Ultrasound Guided Injection of aspiration and injection of right knee effusion Device: GE Logiq E  Images permanently stored and available for review in the ultrasound unit. Verbal informed consent obtained. Discussed risks and benefits of procedure. Warned about infection bleeding damage to structures skin hypopigmentation and fat atrophy among others. Patient expresses understanding and agreement Time-out conducted.  Noted no overlying erythema, induration, or other signs of local infection.  Skin prepped in a sterile fashion.  Local anesthesia: Topical Ethyl chloride and 1 mL of lidocaine With sterile technique and under real time ultrasound guidance: 18-gauge needle used to access the superior lateral patellar space and 90 mL of clear yellow fluid removed. Needle exchanged and 40 mg of Kenalog and 4 mL of Marcaine injected easily.  Completed without difficulty   Advised to call if fevers/chills, erythema,  induration, drainage, or persistent bleeding.  Images permanently stored and available for review in the ultrasound unit.  Impression: Technically successful ultrasound guided injection.   Procedure: Real-time Ultrasound Guided Injection of snapping band of tissue  across the medial femur bone spur, as well as medial port scar Device: GE Logiq E  Images permanently stored and available for review in the ultrasound unit. Verbal informed consent obtained. Discussed risks and benefits of procedure. Warned about infection bleeding damage to structures skin hypopigmentation and fat atrophy among others. Patient expresses understanding and agreement Time-out conducted.  Noted no overlying erythema, induration, or other signs of local infection.  Skin prepped in a sterile fashion.  Local anesthesia: Topical Ethyl chloride.  With sterile technique and under real time ultrasound guidance: 20 mg of Kenalog and 2 mL of Marcaine injected at the area of snapping.  The probe was redirected overlying the medial port scar. Skin was prepped in usual sterile fashion and topical ethyl chloride was used for local anesthesia With sterile technique and under real time ultrasound guidance: 20 mg of Kenalog and 2 mL of Marcaine injected  Completed without difficulty  Pain immediately resolved suggesting accurate placement of the medication.  Advised to call if fevers/chills, erythema, induration, drainage, or persistent bleeding.  Images permanently stored and available for review in the ultrasound unit.  Impression: Technically successful ultrasound guided injection.   Lot numbers: Kenalog [id number] Marcaine: [id number]  No results found for this or any previous visit (from the past 24 hour(s)). No results found.    Assessment and Plan: 45 y.o. male with right knee pain 1) patient has a large knee effusion that was aspirated and injected. 2) he has a snapping band of water pressure with scar tissue overlying a medial femur bone spur. This was injected under ultrasound guidance causing immediate resolution of pain. 3) additionally he has a painful medial port scar. This was also injected with Kenalog and Patient felt much better following injection.   No  orders of the defined types were placed in this encounter.   Discussed warning signs or symptoms. Please see discharge instructions. Patient expresses understanding.

## 2016-04-21 ENCOUNTER — Ambulatory Visit (INDEPENDENT_AMBULATORY_CARE_PROVIDER_SITE_OTHER): Payer: No Typology Code available for payment source | Admitting: Family Medicine

## 2016-04-21 VITALS — BP 129/87 | HR 79 | Wt 254.0 lb

## 2016-04-21 DIAGNOSIS — M25561 Pain in right knee: Secondary | ICD-10-CM | POA: Diagnosis not present

## 2016-04-21 NOTE — Progress Notes (Signed)
       Albert Hogan is a 45 y.o. male who presents to [place]: [place] today for follow-up knee pain. Patient was seen recently for knee pain thought to be due to a plica and port scar.  He's feeling much better almost 100% better. He notes some pain at the medial port scar but otherwise feels fine.   Past Medical History:  Diagnosis Date  . ED (erectile dysfunction)   . Hypertension    Past Surgical History:  Procedure Laterality Date  . APPENDECTOMY  2001  . HERNIA REPAIR     Social History  Substance Use Topics  . Smoking status: Never Smoker  . Smokeless tobacco: Not on file  . Alcohol use Not on file   family history includes Hypertension in his father and mother; Thyroid disease in his mother.  ROS as above:  Medications: Current Outpatient Prescriptions  Medication Sig Dispense Refill  . CIALIS 20 MG tablet TAKE ONE TABLET BY MOUTH ONCE DAILY AS NEEDED FOR  ERECTILE  DYSFUNCTION 10 tablet 0  . EPINEPHrine (EPIPEN 2-PAK) 0.3 mg/0.3 mL IJ SOAJ injection Inject 0.3 mLs (0.3 mg total) into the muscle once. 2 Device 0  . HYDROcodone-acetaminophen (NORCO/VICODIN) 5-325 MG tablet Take 1 tablet by mouth every 6 (six) hours as needed. 25 tablet 0  . olmesartan-hydrochlorothiazide (BENICAR HCT) 40-12.5 MG tablet Take 1 tablet by mouth daily. 90 tablet 1  . tadalafil (CIALIS) 20 MG tablet TAKE ONE TABLET BY MOUTH ONCE DAILY AS NEEDED FOR ERECTILE DYSFUCTION 10 tablet 0   No current facility-administered medications for this visit.    Allergies  Allergen Reactions  . Shellfish Allergy      Exam:  BP 129/87   Pulse 79   Wt 254 lb (115.2 kg)   BMI 33.51 kg/m  Gen: Well NAD Right knee: Normal-appearing no effusion. Mildly tender medial port scar. Palpable plica medially with knee motion however this is nontender. Normal knee motion stable ligamentous exam. Normal  gait  No results found for this or any previous visit (from the past 24 hour(s)). No results found.    Assessment and Plan: 45 y.o. male with right knee pain much improved. Continue watchful waiting. Return as needed.   No orders of the defined types were placed in this encounter.   Discussed warning signs or symptoms. Please see discharge instructions. Patient expresses understanding.

## 2016-04-21 NOTE — Patient Instructions (Signed)
Thank you for coming in today. Return in May for well visit or sooner if needed.

## 2016-06-30 ENCOUNTER — Other Ambulatory Visit: Payer: Self-pay | Admitting: Family Medicine

## 2016-07-01 MED ORDER — TADALAFIL 20 MG PO TABS: ORAL_TABLET | ORAL | 11 refills | Status: DC

## 2016-07-06 ENCOUNTER — Encounter: Payer: Self-pay | Admitting: Family Medicine

## 2016-07-06 ENCOUNTER — Ambulatory Visit (INDEPENDENT_AMBULATORY_CARE_PROVIDER_SITE_OTHER): Payer: No Typology Code available for payment source | Admitting: Family Medicine

## 2016-07-06 VITALS — BP 136/92 | HR 80 | Wt 261.0 lb

## 2016-07-06 DIAGNOSIS — M25461 Effusion, right knee: Secondary | ICD-10-CM

## 2016-07-06 NOTE — Patient Instructions (Signed)
Thank you for coming in today. Call or go to the ER if you develop a large red swollen joint with extreme pain or oozing puss.  Return as needed.    Knee Pain Knee pain is a very common symptom and can have many causes. Knee pain often goes away when you follow your health care provider's instructions for relieving pain and discomfort at home. However, knee pain can develop into a condition that needs treatment. Some conditions may include:  Arthritis caused by wear and tear (osteoarthritis).  Arthritis caused by swelling and irritation (rheumatoid arthritis or gout).  A cyst or growth in your knee.  An infection in your knee joint.  An injury that will not heal.  Damage, swelling, or irritation of the tissues that support your knee (torn ligaments or tendinitis). If your knee pain continues, additional tests may be ordered to diagnose your condition. Tests may include X-rays or other imaging studies of your knee. You may also need to have fluid removed from your knee. Treatment for ongoing knee pain depends on the cause, but treatment may include:  Medicines to relieve pain or swelling.  Steroid injections in your knee.  Physical therapy.  Surgery. HOME CARE INSTRUCTIONS  Take medicines only as directed by your health care provider.  Rest your knee and keep it raised (elevated) while you are resting.  Do not do things that cause or worsen pain.  Avoid high-impact activities or exercises, such as running, jumping rope, or doing jumping jacks.  Apply ice to the knee area:  Put ice in a plastic bag.  Place a towel between your skin and the bag.  Leave the ice on for 20 minutes, 2-3 times a day.  Ask your health care provider if you should wear an elastic knee support.  Keep a pillow under your knee when you sleep.  Lose weight if you are overweight. Extra weight can put pressure on your knee.  Do not use any tobacco products, including cigarettes, chewing tobacco, or  electronic cigarettes. If you need help quitting, ask your health care provider. Smoking may slow the healing of any bone and joint problems that you may have. SEEK MEDICAL CARE IF:  Your knee pain continues, changes, or gets worse.  You have a fever along with knee pain.  Your knee buckles or locks up.  Your knee becomes more swollen. SEEK IMMEDIATE MEDICAL CARE IF:   Your knee joint feels hot to the touch.  You have chest pain or trouble breathing. This information is not intended to replace advice given to you by your health care provider. Make sure you discuss any questions you have with your health care provider. Document Released: 05/29/2007 Document Revised: 08/22/2014 Document Reviewed: 03/17/2014 Elsevier Interactive Patient Education  2017 [employer].

## 2016-07-06 NOTE — Progress Notes (Signed)
       Albert Hogan is a 45 y.o. male who presents to [place]: [place] today for right knee pain and swelling. Patient has been seen several times for right knee pain and swelling. He's had meniscectomy and microfracture surgery. He was seen last in August where he had aspiration and injection felt well until recently. He notes knee pain and swelling. No new injury. No fevers or chills.   Past Medical History:  Diagnosis Date  . ED (erectile dysfunction)   . Hypertension    Past Surgical History:  Procedure Laterality Date  . APPENDECTOMY  2001  . HERNIA REPAIR     Social History  Substance Use Topics  . Smoking status: Never Smoker  . Smokeless tobacco: Not on file  . Alcohol use Not on file   family history includes Hypertension in his father and mother; Thyroid disease in his mother.  ROS as above:  Medications: Current Outpatient Prescriptions  Medication Sig Dispense Refill  . EPINEPHrine (EPIPEN 2-PAK) 0.3 mg/0.3 mL IJ SOAJ injection Inject 0.3 mLs (0.3 mg total) into the muscle once. 2 Device 0  . olmesartan-hydrochlorothiazide (BENICAR HCT) 40-12.5 MG tablet Take 1 tablet by mouth daily. 90 tablet 1  . tadalafil (CIALIS) 20 MG tablet TAKE ONE TABLET BY MOUTH ONCE DAILY AS NEEDED. 10 tablet 11   No current facility-administered medications for this visit.    Allergies  Allergen Reactions  . Shellfish Allergy     Health Maintenance Health Maintenance  Topic Date Due  . INFLUENZA VACCINE  04/15/2017 (Originally 03/15/2016)  . TETANUS/TDAP  08/15/2021  . HIV Screening  Completed     Exam:  BP (!) 136/92   Pulse 80   Wt 261 lb (118.4 kg)   BMI 34.43 kg/m   Gen: Well NAD Right knee effusion. Nontender normal motion stable ligamentous exam.  Procedure: Real-time Ultrasound Guided Injection of right knee  Device: GE Logiq E  Images permanently  stored and available for review in the ultrasound unit. Verbal informed consent obtained. Discussed risks and benefits of procedure. Warned about infection bleeding damage to structures skin hypopigmentation and fat atrophy among others. Patient expresses understanding and agreement Time-out conducted.  Noted no overlying erythema, induration, or other signs of local infection.  Skin prepped in a sterile fashion.  Local anesthesia: Topical Ethyl chloride.  Small amount of lidocaine injected subcutaneously to achieve good anesthesia With sterile technique and under real time ultrasound guidance: 18-gauge needle inserted into the superior lateral patellar space and 20 mL of clear yellowish fluid aspirated. Syringe exchanged and 80 mg of Kenalog and 4 mL of Marcaine injected easily.  Completed without difficulty  Pain immediately resolved suggesting accurate placement of the medication.  Advised to call if fevers/chills, erythema, induration, drainage, or persistent bleeding.  Images permanently stored and available for review in the ultrasound unit.  Impression: Technically successful ultrasound guided injection.    No results found for this or any previous visit (from the past 72 hour(s)). No results found.    Assessment and Plan: 45 y.o. male with knee effusion status post aspiration and injection. Plan to treat conservatively with relative rest. Return as needed.   No orders of the defined types were placed in this encounter.   Discussed warning signs or symptoms. Please see discharge instructions. Patient expresses understanding.

## 2017-01-13 ENCOUNTER — Other Ambulatory Visit: Payer: Self-pay

## 2017-01-13 MED ORDER — OLMESARTAN MEDOXOMIL-HCTZ 40-12.5 MG PO TABS: 1.0000 | ORAL_TABLET | Freq: Every day | ORAL | 0 refills | Status: DC

## 2017-01-26 ENCOUNTER — Ambulatory Visit (INDEPENDENT_AMBULATORY_CARE_PROVIDER_SITE_OTHER): Payer: No Typology Code available for payment source | Admitting: Family Medicine

## 2017-01-26 ENCOUNTER — Encounter: Payer: Self-pay | Admitting: Family Medicine

## 2017-01-26 VITALS — BP 146/92 | HR 93 | Ht 73.0 in | Wt 272.0 lb

## 2017-01-26 DIAGNOSIS — F419 Anxiety disorder, unspecified: Secondary | ICD-10-CM

## 2017-01-26 DIAGNOSIS — E782 Mixed hyperlipidemia: Secondary | ICD-10-CM

## 2017-01-26 DIAGNOSIS — I10 Essential (primary) hypertension: Secondary | ICD-10-CM

## 2017-01-26 DIAGNOSIS — Z0000 Encounter for general adult medical examination without abnormal findings: Secondary | ICD-10-CM | POA: Diagnosis not present

## 2017-01-26 HISTORY — DX: Anxiety disorder, unspecified: F41.9

## 2017-01-26 MED ORDER — TADALAFIL 20 MG PO TABS: ORAL_TABLET | ORAL | 11 refills | Status: DC

## 2017-01-26 MED ORDER — AMLODIPINE BESYLATE 5 MG PO TABS: 5.0000 mg | ORAL_TABLET | Freq: Every day | ORAL | 1 refills | Status: DC

## 2017-01-26 MED ORDER — OLMESARTAN MEDOXOMIL-HCTZ 40-25 MG PO TABS: 1.0000 | ORAL_TABLET | Freq: Every day | ORAL | 0 refills | Status: DC

## 2017-01-26 NOTE — Progress Notes (Signed)
Albert Hogan is a 46 y.o. male who presents to [facility] today for well adult visit discuss hypertension and new anxiety.  Albert Hogan is doing reasonably well. 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 He estimates that he is working 12 hours a day and not sleeping very much. This is caused weight gain and poor quality sleep. He thinks he is getting about 5 hours sleep at night. He notes worsening anxiety symptoms as a result. He has gained weight as he is unable to exercise with this new schedule and is not following a strict diet.  Hypertension: Patient takes Benicar/hydrochlorothiazide 40/12.5 daily. He denies chest pain palpitations or shortness of breath.   Past Medical History:  Diagnosis Date  . ED (erectile dysfunction)   . Hypertension    Past Surgical History:  Procedure Laterality Date  . APPENDECTOMY  2001  . HERNIA REPAIR     Social History  Substance Use Topics  . Smoking status: Never Smoker  . Smokeless tobacco: Never Used  . Alcohol use Not on file   family history includes Hypertension in his father and mother; Thyroid disease in his mother.  ROS as above:  Medications: Current Outpatient Prescriptions  Medication Sig Dispense Refill  . EPINEPHrine (EPIPEN 2-PAK) 0.3 mg/0.3 mL IJ SOAJ injection Inject 0.3 mLs (0.3 mg total) into the muscle once. 2 Device 0  . tadalafil (CIALIS) 20 MG tablet TAKE ONE TABLET BY MOUTH ONCE DAILY AS NEEDED. 10 tablet 11  . amLODipine (NORVASC) 5 MG tablet Take 1 tablet (5 mg total) by mouth daily. 90 tablet 1  . olmesartan-hydrochlorothiazide (BENICAR HCT) 40-25 MG tablet Take 1 tablet by mouth daily. 90 tablet 0   No current facility-administered medications for this visit.    Allergies  Allergen Reactions  . Shellfish Allergy     Health Maintenance Health Maintenance    Topic Date Due  . INFLUENZA VACCINE  04/15/2017 (Originally 03/15/2017)  . TETANUS/TDAP  08/15/2021  . HIV Screening  Completed     Exam:  BP (!) 146/92   Pulse 93   Ht 6\' 1"  (1.854 m)   Wt 272 lb (123.4 kg)   BMI 35.89 kg/m  Gen: Well NAD HEENT: EOMI,  MMM Lungs: Normal work of breathing. CTABL Heart: RRR no MRG Abd: NABS, Soft. Nondistended, Nontender Exts: Brisk capillary refill, warm and well perfused.  Psych: Alert and oriented normal speech the process and affect.  Depression screen [facility] 2/9 01/26/2017 01/26/2017 07/06/2016  Decreased Interest 0 0 0  Down, Depressed, Hopeless 0 0 0  PHQ - 2 Score 0 0 0  Altered sleeping 3 - 0  Tired, decreased energy 3 - 0  Change in appetite 1 - 0  Feeling bad or failure about yourself  1 - 0  Trouble concentrating 1 - 0  Moving slowly or fidgety/restless 0 - 0  Suicidal thoughts 0 - 0  PHQ-9 Score 9 - 0   GAD 7 : Generalized Anxiety Score 01/26/2017 07/06/2016  Nervous, Anxious, on Edge 2 0  Control/stop worrying 2 0  Worry too much - different things 2 0  Trouble relaxing 3 0  Restless 2 0  Easily annoyed or irritable 3 0  Afraid - awful might happen 2 0  Total GAD 7 Score 16 0  Anxiety Difficulty Very difficult Not difficult at all       No results found  for this or any previous visit (from the past 21 hour(s)). No results found.    Assessment and Plan: 46 y.o. male with  Adult. Doing reasonably well. Patient has gained weight and I think he has a lot of stress in his life. We'll discuss these problems below.  Hypertension: Likely related to recent weight gain. Plan increase Benicar/HCT 40/25. We'll also add amlodipine 5 mg. Recheck in 2 months. Check basic fasting labs in the near future.  Anxiety: Recent job stress. This is a fundamental problem. We problem solved this for a while. Later for watchful waiting was seeking out counseling at work and looking for more efficiency work. We'll consider medication if  worsening or not better.  Weight gain: Likely related to stress and decreased exercise. Plan for calorie counting and meal prep being.   Orders Placed This Encounter  Procedures  . CBC  . COMPLETE METABOLIC PANEL WITH GFR  . Lipid Panel w/reflex Direct LDL  . Hemoglobin A1c   Meds ordered this encounter  Medications  . tadalafil (CIALIS) 20 MG tablet    Sig: TAKE ONE TABLET BY MOUTH ONCE DAILY AS NEEDED.    Dispense:  10 tablet    Refill:  11  . olmesartan-hydrochlorothiazide (BENICAR HCT) 40-25 MG tablet    Sig: Take 1 tablet by mouth daily.    Dispense:  90 tablet    Refill:  0  . amLODipine (NORVASC) 5 MG tablet    Sig: Take 1 tablet (5 mg total) by mouth daily.    Dispense:  90 tablet    Refill:  1     Discussed warning signs or symptoms. Please see discharge instructions. Patient expresses understanding.

## 2017-01-26 NOTE — Patient Instructions (Signed)
Thank you for coming in today. Switch to the higher dose of benicar HCT 40/25 Start amlodipine 5mg  daily  Consider stress reduction.  Try food logging and meal prepping.  Recheck in 2 months.   Consider zoloft.   Amlodipine tablets What is this medicine? AMLODIPINE (am LOE di peen) is a calcium-channel blocker. It affects the amount of calcium found in your heart and muscle cells. This relaxes your blood vessels, which can reduce the amount of work the heart has to do. This medicine is used to lower high blood pressure. It is also used to prevent chest pain. This medicine may be used for other purposes; ask your health care provider or pharmacist if you have questions. COMMON BRAND NAME(S): Norvasc What should I tell my health care provider before I take this medicine? They need to know if you have any of these conditions: -heart problems like heart failure or aortic stenosis -liver disease -an unusual or allergic reaction to amlodipine, other medicines, foods, dyes, or preservatives -pregnant or trying to get pregnant -breast-feeding How should I use this medicine? Take this medicine by mouth with a glass of water. Follow the directions on the prescription label. Take your medicine at regular intervals. Do not take more medicine than directed. Talk to your pediatrician regarding the use of this medicine in children. Special care may be needed. This medicine has been used in children as young as 6. Persons over 19 years old may have a stronger reaction to this medicine and need smaller doses. Overdosage: If you think you have taken too much of this medicine contact a poison control center or emergency room at once. NOTE: This medicine is only for you. Do not share this medicine with others. What if I miss a dose? If you miss a dose, take it as soon as you can. If it is almost time for your next dose, take only that dose. Do not take double or extra doses. What may interact with this  medicine? -herbal or dietary supplements -local or general anesthetics -medicines for high blood pressure -medicines for prostate problems -rifampin This list may not describe all possible interactions. Give your health care provider a list of all the medicines, herbs, non-prescription drugs, or dietary supplements you use. Also tell them if you smoke, drink alcohol, or use illegal drugs. Some items may interact with your medicine. What should I watch for while using this medicine? Visit your doctor or health care professional for regular check ups. Check your blood pressure and pulse rate regularly. Ask your health care professional what your blood pressure and pulse rate should be, and when you should contact him or her. This medicine may make you feel confused, dizzy or lightheaded. Do not drive, use machinery, or do anything that needs mental alertness until you know how this medicine affects you. To reduce the risk of dizzy or fainting spells, do not sit or stand up quickly, especially if you are an older patient. Avoid alcoholic drinks; they can make you more dizzy. Do not suddenly stop taking amlodipine. Ask your doctor or health care professional how you can gradually reduce the dose. What side effects may I notice from receiving this medicine? Side effects that you should report to your doctor or health care professional as soon as possible: -allergic reactions like skin rash, itching or hives, swelling of the face, lips, or tongue -breathing problems -changes in vision or hearing -chest pain -fast, irregular heartbeat -swelling of legs or ankles Side effects that  usually do not require medical attention (report to your doctor or health care professional if they continue or are bothersome): -dry mouth -facial flushing -nausea, vomiting -stomach gas, pain -tired, weak -trouble sleeping This list may not describe all possible side effects. Call your doctor for medical advice about  side effects. You may report side effects to FDA at 1-800-FDA-1088. Where should I keep my medicine? Keep out of the reach of children. Store at room temperature between 59 and 86 degrees F (15 and 30 degrees C). Protect from light. Keep container tightly closed. Throw away any unused medicine after the expiration date. NOTE: This sheet is a summary. It may not cover all possible information. If you have questions about this medicine, talk to your doctor, pharmacist, or health care provider.  2018 Elsevier/Gold Standard (2012-06-29 11:40:58)   Sertraline tablets What is this medicine? SERTRALINE (SER tra leen) is used to treat depression. It may also be used to treat obsessive compulsive disorder, panic disorder, post-trauma stress, premenstrual dysphoric disorder (PMDD) or social anxiety. This medicine may be used for other purposes; ask your health care provider or pharmacist if you have questions. COMMON BRAND NAME(S): Zoloft What should I tell my health care provider before I take this medicine? They need to know if you have any of these conditions: -bleeding disorders -bipolar disorder or a family history of bipolar disorder -glaucoma -heart disease -high blood pressure -history of irregular heartbeat -history of low levels of calcium, magnesium, or potassium in the blood -if you often drink alcohol -liver disease -receiving electroconvulsive therapy -seizures -suicidal thoughts, plans, or attempt; a previous suicide attempt by you or a family member -take medicines that treat or prevent blood clots -thyroid disease -an unusual or allergic reaction to sertraline, other medicines, foods, dyes, or preservatives -pregnant or trying to get pregnant -breast-feeding How should I use this medicine? Take this medicine by mouth with a glass of water. Follow the directions on the prescription label. You can take it with or without food. Take your medicine at regular intervals. Do not  take your medicine more often than directed. Do not stop taking this medicine suddenly except upon the advice of your doctor. Stopping this medicine too quickly may cause serious side effects or your condition may worsen. A special MedGuide will be given to you by the pharmacist with each prescription and refill. Be sure to read this information carefully each time. Talk to your pediatrician regarding the use of this medicine in children. While this drug may be prescribed for children as young as 7 years for selected conditions, precautions do apply. Overdosage: If you think you have taken too much of this medicine contact a poison control center or emergency room at once. NOTE: This medicine is only for you. Do not share this medicine with others. What if I miss a dose? If you miss a dose, take it as soon as you can. If it is almost time for your next dose, take only that dose. Do not take double or extra doses. What may interact with this medicine? Do not take this medicine with any of the following medications: -cisapride -dofetilide -dronedarone -linezolid -MAOIs like Carbex, Eldepryl, Marplan, Nardil, and Parnate -methylene blue (injected into a vein) -pimozide -thioridazine This medicine may also interact with the following medications: -alcohol -amphetamines -aspirin and aspirin-like medicines -certain medicines for depression, anxiety, or psychotic disturbances -certain medicines for fungal infections like ketoconazole, fluconazole, posaconazole, and itraconazole -certain medicines for irregular heart beat like flecainide, quinidine,  propafenone -certain medicines for migraine headaches like almotriptan, eletriptan, frovatriptan, naratriptan, rizatriptan, sumatriptan, zolmitriptan -certain medicines for sleep -certain medicines for seizures like carbamazepine, valproic acid, phenytoin -certain medicines that treat or prevent blood clots like warfarin, enoxaparin,  dalteparin -cimetidine -digoxin -diuretics -fentanyl -isoniazid -lithium -NSAIDs, medicines for pain and inflammation, like ibuprofen or naproxen -other medicines that prolong the QT interval (cause an abnormal heart rhythm) -rasagiline -safinamide -supplements like St. John's wort, kava kava, valerian -tolbutamide -tramadol -tryptophan This list may not describe all possible interactions. Give your health care provider a list of all the medicines, herbs, non-prescription drugs, or dietary supplements you use. Also tell them if you smoke, drink alcohol, or use illegal drugs. Some items may interact with your medicine. What should I watch for while using this medicine? Tell your doctor if your symptoms do not get better or if they get worse. Visit your doctor or health care professional for regular checks on your progress. Because it may take several weeks to see the full effects of this medicine, it is important to continue your treatment as prescribed by your doctor. Patients and their families should watch out for new or worsening thoughts of suicide or depression. Also watch out for sudden changes in feelings such as feeling anxious, agitated, panicky, irritable, hostile, aggressive, impulsive, severely restless, overly excited and hyperactive, or not being able to sleep. If this happens, especially at the beginning of treatment or after a change in dose, call your health care professional. Dennis Bast may get drowsy or dizzy. Do not drive, use machinery, or do anything that needs mental alertness until you know how this medicine affects you. Do not stand or sit up quickly, especially if you are an older patient. This reduces the risk of dizzy or fainting spells. Alcohol may interfere with the effect of this medicine. Avoid alcoholic drinks. Your mouth may get dry. Chewing sugarless gum or sucking hard candy, and drinking plenty of water may help. Contact your doctor if the problem does not go away or  is severe. What side effects may I notice from receiving this medicine? Side effects that you should report to your doctor or health care professional as soon as possible: -allergic reactions like skin rash, itching or hives, swelling of the face, lips, or tongue -anxious -black, tarry stools -changes in vision -confusion -elevated mood, decreased need for sleep, racing thoughts, impulsive behavior -eye pain -fast, irregular heartbeat -feeling faint or lightheaded, falls -feeling agitated, angry, or irritable -hallucination, loss of contact with reality -loss of balance or coordination -loss of memory -painful or prolonged erections -restlessness, pacing, inability to keep still -seizures -stiff muscles -suicidal thoughts or other mood changes -trouble sleeping -unusual bleeding or bruising -unusually weak or tired -vomiting Side effects that usually do not require medical attention (report to your doctor or health care professional if they continue or are bothersome): -change in appetite or weight -change in sex drive or performance -diarrhea -increased sweating -indigestion, nausea -tremors This list may not describe all possible side effects. Call your doctor for medical advice about side effects. You may report side effects to FDA at 1-800-FDA-1088. Where should I keep my medicine? Keep out of the reach of children. Store at room temperature between 15 and 30 degrees C (59 and 86 degrees F). Throw away any unused medicine after the expiration date. NOTE: This sheet is a summary. It may not cover all possible information. If you have questions about this medicine, talk to your doctor, pharmacist, or health  care provider.  2018 Elsevier/Gold Standard (2016-08-05 14:17:49)

## 2017-02-01 LAB — COMPLETE METABOLIC PANEL WITH GFR
ALT: 20 U/L (ref 9–46)
AST: 22 U/L (ref 10–40)
Albumin: 4.3 g/dL (ref 3.6–5.1)
Alkaline Phosphatase: 67 U/L (ref 40–115)
BILIRUBIN TOTAL: 0.3 mg/dL (ref 0.2–1.2)
BUN: 26 mg/dL — ABNORMAL HIGH (ref 7–25)
CALCIUM: 9.1 mg/dL (ref 8.6–10.3)
CHLORIDE: 105 mmol/L (ref 98–110)
CO2: 25 mmol/L (ref 20–31)
CREATININE: 1.53 mg/dL — AB (ref 0.60–1.35)
GFR, EST AFRICAN AMERICAN: 63 mL/min (ref >=60)
GFR, EST NON AFRICAN AMERICAN: 54 mL/min — AB (ref >=60)
Glucose, Bld: 104 mg/dL — ABNORMAL HIGH (ref 65–99)
Potassium: 4.5 mmol/L (ref 3.5–5.3)
Sodium: 141 mmol/L (ref 135–146)
TOTAL PROTEIN: 7.3 g/dL (ref 6.1–8.1)

## 2017-02-01 LAB — CBC
HEMATOCRIT: 43.7 % (ref 38.5–50.0)
Hemoglobin: 15 g/dL (ref 13.2–17.1)
MCH: 29.9 pg (ref 27.0–33.0)
MCHC: 34.3 g/dL (ref 32.0–36.0)
MCV: 87.2 fL (ref 80.0–100.0)
MPV: 12 fL (ref 7.5–12.5)
Platelets: 211 10*3/uL (ref 140–400)
RBC: 5.01 MIL/uL (ref 4.20–5.80)
RDW: 14.9 % (ref 11.0–15.0)
WBC: 6 10*3/uL (ref 3.8–10.8)

## 2017-02-01 LAB — LIPID PANEL W/REFLEX DIRECT LDL
Cholesterol: 203 mg/dL — ABNORMAL HIGH (ref <200)
HDL: 46 mg/dL (ref >40)
LDL-CHOLESTEROL: 143 mg/dL — AB
Non-HDL Cholesterol (Calc): 157 mg/dL — ABNORMAL HIGH (ref <130)
TRIGLYCERIDES: 53 mg/dL (ref <150)
Total CHOL/HDL Ratio: 4.4 Ratio (ref <5.0)

## 2017-02-02 LAB — HEMOGLOBIN A1C
HEMOGLOBIN A1C: 5.8 % — AB (ref <5.7)
MEAN PLASMA GLUCOSE: 120 mg/dL

## 2017-04-07 ENCOUNTER — Ambulatory Visit (INDEPENDENT_AMBULATORY_CARE_PROVIDER_SITE_OTHER): Payer: No Typology Code available for payment source

## 2017-04-07 ENCOUNTER — Encounter: Payer: Self-pay | Admitting: Family Medicine

## 2017-04-07 ENCOUNTER — Ambulatory Visit (INDEPENDENT_AMBULATORY_CARE_PROVIDER_SITE_OTHER): Payer: No Typology Code available for payment source | Admitting: Family Medicine

## 2017-04-07 VITALS — BP 131/88 | HR 86 | Wt 264.0 lb

## 2017-04-07 DIAGNOSIS — S6991XA Unspecified injury of right wrist, hand and finger(s), initial encounter: Secondary | ICD-10-CM

## 2017-04-07 DIAGNOSIS — M79644 Pain in right finger(s): Secondary | ICD-10-CM

## 2017-04-07 DIAGNOSIS — M25461 Effusion, right knee: Secondary | ICD-10-CM | POA: Diagnosis not present

## 2017-04-07 DIAGNOSIS — F321 Major depressive disorder, single episode, moderate: Secondary | ICD-10-CM | POA: Diagnosis not present

## 2017-04-07 DIAGNOSIS — M958 Other specified acquired deformities of musculoskeletal system: Secondary | ICD-10-CM

## 2017-04-07 MED ORDER — SERTRALINE HCL 25 MG PO TABS: ORAL_TABLET | ORAL | 1 refills | Status: DC

## 2017-04-07 MED ORDER — DICLOFENAC SODIUM 1 % TD GEL: 4.0000 g | Freq: Four times a day (QID) | TRANSDERMAL | 11 refills | Status: DC

## 2017-04-07 NOTE — Progress Notes (Signed)
Albert Hogan is a 46 y.o. male who presents to [place]: [place] today for right knee pain and depression.  Right knee pain: Patient has had right knee pain intermittently for years now. He had an extensive surgical debridement and microfracture to the right knee April 2017. He notes the knee is continuing to be bothersome and painful. He had an injection of the right knee around 9 months ago which provided reasonable pain control until recently. He notes a few weeks of knee swelling and pain. Pain is worse with activity. The pain is interfering with his quality of life. He is unable to exercise normally because of the pain.  Depression: Patient has a history of stress and depression recently. He notes symptoms have worsened. He is sought out counseling at work but has not yet started. He is interested medications.  Additionally patient notes pain at the right finger. He notes that he injured the right fifth digit DIP joint about a month ago. He notes pain and stiffness.  Past Medical History:  Diagnosis Date  . ED (erectile dysfunction)   . Hypertension    Past Surgical History:  Procedure Laterality Date  . APPENDECTOMY  2001  . HERNIA REPAIR     Social History  Substance Use Topics  . Smoking status: Never Smoker  . Smokeless tobacco: Never Used  . Alcohol use Not on file   family history includes Hypertension in his father and mother; Thyroid disease in his mother.  ROS as above:  Medications: Current Outpatient Prescriptions  Medication Sig Dispense Refill  . amLODipine (NORVASC) 5 MG tablet Take 1 tablet (5 mg total) by mouth daily. 90 tablet 1  . EPINEPHrine (EPIPEN 2-PAK) 0.3 mg/0.3 mL IJ SOAJ injection Inject 0.3 mLs (0.3 mg total) into the muscle once. 2 Device 0  . olmesartan-hydrochlorothiazide (BENICAR HCT) 40-25 MG tablet Take 1 tablet by mouth daily.  90 tablet 0  . tadalafil (CIALIS) 20 MG tablet TAKE ONE TABLET BY MOUTH ONCE DAILY AS NEEDED. 10 tablet 11  . diclofenac sodium (VOLTAREN) 1 % GEL Apply 4 g topically 4 (four) times daily. To affected joint. 100 g 11  . sertraline (ZOLOFT) 25 MG tablet Take 25mg  daily for 1 week then increase to 50mg  daily 30 tablet 1   No current facility-administered medications for this visit.    Allergies  Allergen Reactions  . Shellfish Allergy     Health Maintenance Health Maintenance  Topic Date Due  . INFLUENZA VACCINE  04/07/2018 (Originally 03/15/2017)  . TETANUS/TDAP  08/15/2021  . HIV Screening  Completed     Exam:  BP 131/88   Pulse 86   Wt 264 lb (119.7 kg)   BMI 34.83 kg/m   Wt Readings from Last 10 Encounters:  04/07/17 264 lb (119.7 kg)  01/26/17 272 lb (123.4 kg)  07/06/16 261 lb (118.4 kg)  04/21/16 254 lb (115.2 kg)  03/24/16 261 lb (118.4 kg)  12/29/15 254 lb (115.2 kg)  12/17/15 250 lb (113.4 kg)  12/09/15 251 lb (113.9 kg)  08/20/15 257 lb (116.6 kg)  07/28/15 258 lb (117 kg)    Gen: Well NAD HEENT: EOMI,  MMM Lungs: Normal work of breathing. CTABL Heart: RRR no MRG Abd: NABS, Soft. Nondistended, Nontender Exts: Brisk capillary refill, warm and well perfused.  MSK: Right knee moderate effusion nontender normal motion crepitations with extension. Right hand normal-appearing with no deformity right fifth digit. Tender to palpation  DIP. Slight decreased flexion and extension range of motion. Intact flexion and extension strength. Psych: Alert and oriented normal speech thought process and affect no SI or HI expressed.   Depression screen [facility] 2/9 04/07/2017 01/26/2017 01/26/2017 07/06/2016  Decreased Interest 2 0 0 0  Down, Depressed, Hopeless 1 0 0 0  PHQ - 2 Score 3 0 0 0  Altered sleeping 2 3 - 0  Tired, decreased energy 3 3 - 0  Change in appetite 3 1 - 0  Feeling bad or failure about yourself  2 1 - 0  Trouble concentrating 2 1 - 0  Moving slowly or  fidgety/restless 1 0 - 0  Suicidal thoughts 0 0 - 0  PHQ-9 Score 16 9 - 0  Difficult doing work/chores Somewhat difficult - - -     Procedure: Real-time Ultrasound Guided Injection of right knee  Device: GE Logiq E  Images permanently stored and available for review in the ultrasound unit. Verbal informed consent obtained. Discussed risks and benefits of procedure. Warned about infection bleeding damage to structures skin hypopigmentation and fat atrophy among others. Patient expresses understanding and agreement Time-out conducted.  Noted no overlying erythema, induration, or other signs of local infection.  Skin prepped in a sterile fashion.  Local anesthesia: Topical Ethyl chloride.  With sterile technique and under real time ultrasound guidance: 80mg  kenalog and 62ml marcaine injected easily.  Completed without difficulty  Pain immediately resolved suggesting accurate placement of the medication.  Advised to call if fevers/chills, erythema, induration, drainage, or persistent bleeding.  Images permanently stored and available for review in the ultrasound unit.  Impression: Technically successful ultrasound guided injection.    No results found for this or any previous visit (from the past 72 hour(s)). Dg Finger Little Right  Result Date: 04/07/2017 CLINICAL DATA:  Right pinky finger pain. EXAM: RIGHT LITTLE FINGER 2+V COMPARISON:  None. FINDINGS: There is no evidence of fracture or dislocation. There is no evidence of arthropathy or other focal bone abnormality. Soft tissues are unremarkable. IMPRESSION: Negative. Electronically Signed   By: Rolm Baptise M.D.   On: 04/07/2017 12:30      Assessment and Plan: 46 y.o. male with  Right knee pain: Exacerbation of existing degenerative joint disease. Patient has large effusion today. Plan for steroid injection. We spent time discussing knee replacement. Hopefully we'll be able to continue conservative pain management.  Additionally we'll use diclofenac gel for pain control.  Right finger injury likely traumatic synovitis based on normal-appearing x-ray today. Plan for diclofenac gel. Recheck in 2 weeks if not better will consider hand physical therapy.  Depression: New diagnosis today. Continue counseling. Start Zoloft with taper tab 100 mg over the next few weeks. Plan to recheck in 2 weeks.   Orders Placed This Encounter  Procedures  . DG Finger Little Right    Standing Status:   Future    Number of Occurrences:   1    Standing Expiration Date:   06/07/2018    Order Specific Question:   Reason for Exam (SYMPTOM  OR DIAGNOSIS REQUIRED)    Answer:   eval DIP    Order Specific Question:   Preferred imaging location?    Answer:   [facility]    Order Specific Question:   Radiology Contrast Protocol - do NOT remove file path    Answer:   [email or web address]   Meds ordered this encounter  Medications  . diclofenac sodium (VOLTAREN) 1 % GEL    Sig:  Apply 4 g topically 4 (four) times daily. To affected joint.    Dispense:  100 g    Refill:  11  . sertraline (ZOLOFT) 25 MG tablet    Sig: Take 25mg  daily for 1 week then increase to 50mg  daily    Dispense:  30 tablet    Refill:  1     Discussed warning signs or symptoms. Please see discharge instructions. Patient expresses understanding.

## 2017-04-07 NOTE — Patient Instructions (Signed)
Thank you for coming in today. For knee we will follow the injection today.  For finger get xray today.  Use Diclofenac gel on both the knee and finger.  For depression take zoloft 25mg  daily for 1 week.  Increase to 50mg  (2 pills) daily after 1 week.  Recheck with me in 2 - 3 weeks.    Major Depressive Disorder, Adult Major depressive disorder (MDD) is a mental health condition. MDD often makes you feel sad, hopeless, or helpless. MDD can also cause symptoms in your body. MDD can affect your:  Work.  School.  Relationships.  Other normal activities.  MDD can range from mild to very bad. It may occur once (single episode MDD). It can also occur many times (recurrent MDD). The main symptoms of MDD often include:  Feeling sad, depressed, or irritable most of the time.  Loss of interest.  MDD symptoms also include:  Sleeping too much or too little.  Eating too much or too little.  A change in your weight.  Feeling tired (fatigue) or having low energy.  Feeling worthless.  Feeling guilty.  Trouble making decisions.  Trouble thinking clearly.  Thoughts of suicide or harming others.  Feeling weak.  Feeling agitated.  Keeping yourself from being around other people (isolation).  Follow these instructions at home: Activity  Do these things as told by your doctor: ? Go back to your normal activities. ? Exercise regularly. ? Spend time outdoors. Alcohol  Talk with your doctor about how alcohol can affect your antidepressant medicines.  Do not drink alcohol. Or, limit how much alcohol you drink. ? This means no more than 1 drink a day for nonpregnant women and 2 drinks a day for men. One drink equals one of these:  12 oz of beer.  5 oz of wine.  1 oz of hard liquor. General instructions  Take over-the-counter and prescription medicines only as told by your doctor.  Eat a healthy diet.  Get plenty of sleep.  Find activities that you enjoy. Make  time to do them.  Think about joining a support group. Your doctor may be able to suggest a group for you.  Keep all follow-up visits as told by your doctor. This is important. Where to find more information:  [employer] on Mental Illness: ? www.nami.[place]: ? https://carter.com/  National Suicide Prevention Lifeline: ? 416-153-6753. This is free, 24-hour help. Contact a doctor if:  Your symptoms get worse.  You have new symptoms. Get help right away if:  You self-harm.  You see, hear, taste, smell, or feel things that are not present (hallucinate). If you ever feel like you may hurt yourself or others, or have thoughts about taking your own life, get help right away. You can go to your nearest emergency department or call:  Your local emergency services (911 in the [place]  A suicide crisis helpline, such as the National Suicide Prevention Lifeline: ? 5073477275. This is open 24 hours a day.  This information is not intended to replace advice given to you by your health care provider. Make sure you discuss any questions you have with your health care provider. Document Released: 07/13/2015 Document Revised: 04/17/2016 Document Reviewed: 04/17/2016 Elsevier Interactive Patient Education  2017 Elsevier Inc.   Sertraline tablets What is this medicine? SERTRALINE (SER tra leen) is used to treat depression. It may also be used to treat obsessive compulsive disorder, panic disorder, post-trauma stress, premenstrual dysphoric disorder (PMDD)  or social anxiety. This medicine may be used for other purposes; ask your health care provider or pharmacist if you have questions. COMMON BRAND NAME(S): Zoloft What should I tell my health care provider before I take this medicine? They need to know if you have any of these conditions: -bleeding disorders -bipolar disorder or a family history of bipolar disorder -glaucoma -heart  disease -high blood pressure -history of irregular heartbeat -history of low levels of calcium, magnesium, or potassium in the blood -if you often drink alcohol -liver disease -receiving electroconvulsive therapy -seizures -suicidal thoughts, plans, or attempt; a previous suicide attempt by you or a family member -take medicines that treat or prevent blood clots -thyroid disease -an unusual or allergic reaction to sertraline, other medicines, foods, dyes, or preservatives -pregnant or trying to get pregnant -breast-feeding How should I use this medicine? Take this medicine by mouth with a glass of water. Follow the directions on the prescription label. You can take it with or without food. Take your medicine at regular intervals. Do not take your medicine more often than directed. Do not stop taking this medicine suddenly except upon the advice of your doctor. Stopping this medicine too quickly may cause serious side effects or your condition may worsen. A special MedGuide will be given to you by the pharmacist with each prescription and refill. Be sure to read this information carefully each time. Talk to your pediatrician regarding the use of this medicine in children. While this drug may be prescribed for children as young as 7 years for selected conditions, precautions do apply. Overdosage: If you think you have taken too much of this medicine contact a poison control center or emergency room at once. NOTE: This medicine is only for you. Do not share this medicine with others. What if I miss a dose? If you miss a dose, take it as soon as you can. If it is almost time for your next dose, take only that dose. Do not take double or extra doses. What may interact with this medicine? Do not take this medicine with any of the following medications: -cisapride -dofetilide -dronedarone -linezolid -MAOIs like Carbex, Eldepryl, Marplan, Nardil, and Parnate -methylene blue (injected into a  vein) -pimozide -thioridazine This medicine may also interact with the following medications: -alcohol -amphetamines -aspirin and aspirin-like medicines -certain medicines for depression, anxiety, or psychotic disturbances -certain medicines for fungal infections like ketoconazole, fluconazole, posaconazole, and itraconazole -certain medicines for irregular heart beat like flecainide, quinidine, propafenone -certain medicines for migraine headaches like almotriptan, eletriptan, frovatriptan, naratriptan, rizatriptan, sumatriptan, zolmitriptan -certain medicines for sleep -certain medicines for seizures like carbamazepine, valproic acid, phenytoin -certain medicines that treat or prevent blood clots like warfarin, enoxaparin, dalteparin -cimetidine -digoxin -diuretics -fentanyl -isoniazid -lithium -NSAIDs, medicines for pain and inflammation, like ibuprofen or naproxen -other medicines that prolong the QT interval (cause an abnormal heart rhythm) -rasagiline -safinamide -supplements like St. John's wort, kava kava, valerian -tolbutamide -tramadol -tryptophan This list may not describe all possible interactions. Give your health care provider a list of all the medicines, herbs, non-prescription drugs, or dietary supplements you use. Also tell them if you smoke, drink alcohol, or use illegal drugs. Some items may interact with your medicine. What should I watch for while using this medicine? Tell your doctor if your symptoms do not get better or if they get worse. Visit your doctor or health care professional for regular checks on your progress. Because it may take several weeks to see the full effects  of this medicine, it is important to continue your treatment as prescribed by your doctor. Patients and their families should watch out for new or worsening thoughts of suicide or depression. Also watch out for sudden changes in feelings such as feeling anxious, agitated, panicky,  irritable, hostile, aggressive, impulsive, severely restless, overly excited and hyperactive, or not being able to sleep. If this happens, especially at the beginning of treatment or after a change in dose, call your health care professional. Dennis Bast may get drowsy or dizzy. Do not drive, use machinery, or do anything that needs mental alertness until you know how this medicine affects you. Do not stand or sit up quickly, especially if you are an older patient. This reduces the risk of dizzy or fainting spells. Alcohol may interfere with the effect of this medicine. Avoid alcoholic drinks. Your mouth may get dry. Chewing sugarless gum or sucking hard candy, and drinking plenty of water may help. Contact your doctor if the problem does not go away or is severe. What side effects may I notice from receiving this medicine? Side effects that you should report to your doctor or health care professional as soon as possible: -allergic reactions like skin rash, itching or hives, swelling of the face, lips, or tongue -anxious -black, tarry stools -changes in vision -confusion -elevated mood, decreased need for sleep, racing thoughts, impulsive behavior -eye pain -fast, irregular heartbeat -feeling faint or lightheaded, falls -feeling agitated, angry, or irritable -hallucination, loss of contact with reality -loss of balance or coordination -loss of memory -painful or prolonged erections -restlessness, pacing, inability to keep still -seizures -stiff muscles -suicidal thoughts or other mood changes -trouble sleeping -unusual bleeding or bruising -unusually weak or tired -vomiting Side effects that usually do not require medical attention (report to your doctor or health care professional if they continue or are bothersome): -change in appetite or weight -change in sex drive or performance -diarrhea -increased sweating -indigestion, nausea -tremors This list may not describe all possible side  effects. Call your doctor for medical advice about side effects. You may report side effects to FDA at 1-800-FDA-1088. Where should I keep my medicine? Keep out of the reach of children. Store at room temperature between 15 and 30 degrees C (59 and 86 degrees F). Throw away any unused medicine after the expiration date. NOTE: This sheet is a summary. It may not cover all possible information. If you have questions about this medicine, talk to your doctor, pharmacist, or health care provider.  2018 Elsevier/Gold Standard (2016-08-05 14:17:49)

## 2017-04-19 ENCOUNTER — Ambulatory Visit: Payer: No Typology Code available for payment source | Admitting: Family Medicine

## 2017-04-21 ENCOUNTER — Encounter: Payer: Self-pay | Admitting: Family Medicine

## 2017-04-21 ENCOUNTER — Ambulatory Visit (INDEPENDENT_AMBULATORY_CARE_PROVIDER_SITE_OTHER): Payer: No Typology Code available for payment source | Admitting: Family Medicine

## 2017-04-21 VITALS — BP 119/81 | HR 76 | Wt 263.0 lb

## 2017-04-21 DIAGNOSIS — Z6834 Body mass index (BMI) 34.0-34.9, adult: Secondary | ICD-10-CM

## 2017-04-21 DIAGNOSIS — F321 Major depressive disorder, single episode, moderate: Secondary | ICD-10-CM

## 2017-04-21 DIAGNOSIS — M958 Other specified acquired deformities of musculoskeletal system: Secondary | ICD-10-CM

## 2017-04-21 HISTORY — DX: Morbid (severe) obesity due to excess calories: E66.01

## 2017-04-21 MED ORDER — SERTRALINE HCL 100 MG PO TABS: 100.0000 mg | ORAL_TABLET | Freq: Every day | ORAL | 3 refills | Status: DC

## 2017-04-21 NOTE — Patient Instructions (Addendum)
Thank you for coming in today. I recommend continued therapy/conssueling at work.  Increase zoloft to 100mg  in 1 week so.  You are doing quite well now send me a note in 1 month.   For weight continue lower calorie and carb diet.  Try to get less than 2000 calories a day.   Recheck in 3-6 months or sooner if needed.

## 2017-04-21 NOTE — Progress Notes (Signed)
Albert Hogan is a 46 y.o. male who presents to [facility] [facility] today for follow-up depression obesity  and knee pain.  Depression: Patient was diagnosed with depression 2 weeks ago. He was started on Zoloft and continues counseling. He notes significant improvement in symptoms. He currently takes 50 mg daily. He is tolerating the medication well.  Obesity: Patient has worked hard to lose weight over the last 2 weeks. He is eating a improved diet and exercising more.  Knee pain: Patient received a steroid injection to his right knee 2 weeks ago. No significant improvement in pain.   Past Medical History:  Diagnosis Date  . ED (erectile dysfunction)   . Hypertension    Past Surgical History:  Procedure Laterality Date  . APPENDECTOMY  2001  . HERNIA REPAIR     Social History  Substance Use Topics  . Smoking status: Never Smoker  . Smokeless tobacco: Never Used  . Alcohol use Not on file   family history includes Hypertension in his father and mother; Thyroid disease in his mother.  ROS as above:  Medications: Current Outpatient Prescriptions  Medication Sig Dispense Refill  . amLODipine (NORVASC) 5 MG tablet Take 1 tablet (5 mg total) by mouth daily. 90 tablet 1  . diclofenac sodium (VOLTAREN) 1 % GEL Apply 4 g topically 4 (four) times daily. To affected joint. 100 g 11  . EPINEPHrine (EPIPEN 2-PAK) 0.3 mg/0.3 mL IJ SOAJ injection Inject 0.3 mLs (0.3 mg total) into the muscle once. 2 Device 0  . olmesartan-hydrochlorothiazide (BENICAR HCT) 40-25 MG tablet Take 1 tablet by mouth daily. 90 tablet 0  . tadalafil (CIALIS) 20 MG tablet TAKE ONE TABLET BY MOUTH ONCE DAILY AS NEEDED. 10 tablet 11  . sertraline (ZOLOFT) 100 MG tablet Take 1 tablet (100 mg total) by mouth daily. 90 tablet 3   No current facility-administered medications for this visit.    Allergies    Allergen Reactions  . Shellfish Allergy     Health Maintenance Health Maintenance  Topic Date Due  . INFLUENZA VACCINE  04/07/2018 (Originally 03/15/2017)  . TETANUS/TDAP  08/15/2021  . HIV Screening  Completed     Exam:  BP 119/81   Pulse 76   Wt 263 lb (119.3 kg)   BMI 34.70 kg/m   Wt Readings from Last 5 Encounters:  04/21/17 263 lb (119.3 kg)  04/07/17 264 lb (119.7 kg)  01/26/17 272 lb (123.4 kg)  07/06/16 261 lb (118.4 kg)  04/21/16 254 lb (115.2 kg)    Gen: Well NAD HEENT: EOMI,  MMM Lungs: Normal work of breathing. CTABL Heart: RRR no MRG Abd: NABS, Soft. Nondistended, Nontender Exts: Brisk capillary refill, warm and well perfused.  MSK: Right knee no effusion nontender normal motion. Psych alert and oriented normal speech thought process and affect.  Depression screen [facility] 2/9 04/21/2017 04/07/2017 01/26/2017 01/26/2017 07/06/2016  Decreased Interest 1 2 0 0 0  Down, Depressed, Hopeless 0 1 0 0 0  PHQ - 2 Score 1 3 0 0 0  Altered sleeping 1 2 3  - 0  Tired, decreased energy 1 3 3  - 0  Change in appetite 1 3 1  - 0  Feeling bad or failure about yourself  0 2 1 - 0  Trouble concentrating 0 2 1 - 0  Moving slowly or fidgety/restless 0 1 0 - 0  Suicidal thoughts 0 0 0 - 0  PHQ-9 Score 4 16  9 - 0  Difficult doing work/chores - Somewhat difficult - - -      No results found for this or any previous visit (from the past 72 hour(s)). No results found.    Assessment and Plan: 46 y.o. male with  Depression: Improved. Plan to increase Zoloft to 100 mg in the next week and tinea counseling/therapy. Recheck in a few months.  Knee pain: Improved continue quad strengthening and weight loss. Return for knee injection as needed.  Obesity: Continue lower calorie lower carbohydrate diet. Continue food log in and recheck in a few months.   No orders of the defined types were placed in this encounter.  Meds ordered this encounter  Medications  . sertraline (ZOLOFT)  100 MG tablet    Sig: Take 1 tablet (100 mg total) by mouth daily.    Dispense:  90 tablet    Refill:  3     Discussed warning signs or symptoms. Please see discharge instructions. Patient expresses understanding.

## 2017-05-19 ENCOUNTER — Other Ambulatory Visit: Payer: Self-pay | Admitting: Family Medicine

## 2017-06-26 ENCOUNTER — Other Ambulatory Visit: Payer: Self-pay | Admitting: Family Medicine

## 2017-06-28 MED ORDER — OLMESARTAN MEDOXOMIL-HCTZ 40-25 MG PO TABS: 1.0000 | ORAL_TABLET | Freq: Every day | ORAL | 0 refills | Status: DC

## 2017-07-05 ENCOUNTER — Ambulatory Visit: Payer: No Typology Code available for payment source | Admitting: Family Medicine

## 2017-07-05 ENCOUNTER — Encounter: Payer: Self-pay | Admitting: Family Medicine

## 2017-07-05 VITALS — BP 111/76 | HR 90 | Temp 97.7°F | Resp 16 | Ht 73.0 in | Wt 285.0 lb

## 2017-07-05 DIAGNOSIS — M25461 Effusion, right knee: Secondary | ICD-10-CM | POA: Diagnosis not present

## 2017-07-05 NOTE — Patient Instructions (Signed)
Thank you for coming in today. Call or go to the ER if you develop a large red swollen joint with extreme pain or oozing puss.   Next steps would be gel shots.   We may also consider PRP.  Platelet Rich Plasma Injections.  Sodium Hyaluronate intra-articular injection What is this medicine? SODIUM HYALURONATE (SOE dee um hye al yoor ON ate) is used to treat pain in the knee due to osteoarthritis. This medicine may be used for other purposes; ask your health care provider or pharmacist if you have questions. COMMON BRAND NAME(S): Amvisc, DUROLANE, Euflexxa, GELSYN-3, Hyalgan, Monovisc, Orthovisc, Supartz, Supartz FX What should I tell my health care provider before I take this medicine? They need to know if you have any of these conditions: -bleeding disorders -glaucoma -infection in the knee joint -skin conditions or sensitivity -skin infection -an unusual allergic reaction to sodium hyaluronate, other medicines, foods, dyes, or preservatives. Different brands of sodium hyaluronate contain different allergens. Some may contain egg. Talk to your doctor about your allergies to make sure that you get the right product. -pregnant or trying to get pregnant -breast-feeding How should I use this medicine? This medicine is for injection into the knee joint. It is given by a health care professional in a hospital or clinic setting. Talk to your pediatrician regarding the use of this medicine in children. Special care may be needed. Overdosage: If you think you have taken too much of this medicine contact a poison control center or emergency room at once. NOTE: This medicine is only for you. Do not share this medicine with others. What if I miss a dose? This does not apply. What may interact with this medicine? Interactions are not expected. This list may not describe all possible interactions. Give your health care provider a list of all the medicines, herbs, non-prescription drugs, or dietary  supplements you use. Also tell them if you smoke, drink alcohol, or use illegal drugs. Some items may interact with your medicine. What should I watch for while using this medicine? Tell your doctor or healthcare professional if your symptoms do not start to get better or if they get worse. If receiving this medicine for osteoarthritis, limit your activity after you receive your injection. Avoid physical activity for 48 hours following your injection to keep your knee from swelling. Do not stand on your feet for more than 1 hour at a time during the first 48 hours following your injection. Ask your doctor or healthcare professional about when you can begin major physical activity again. What side effects may I notice from receiving this medicine? Side effects that you should report to your doctor or health care professional as soon as possible: -allergic reactions like skin rash, itching or hives, swelling of the face, lips, or tongue -dizziness -facial flushing -pain, tingling, numbness in the hands or feet -vision changes if received this medicine during eye surgery Side effects that usually do not require medical attention (report to your doctor or health care professional if they continue or are bothersome): -back pain -bruising at site where injected -chills -diarrhea -fever -headache -joint pain -joint stiffness -joint swelling -muscle cramps -muscle pain -nausea, vomiting -pain, redness, or irritation at site where injected -weak or tired This list may not describe all possible side effects. Call your doctor for medical advice about side effects. You may report side effects to FDA at 1-800-FDA-1088. Where should I keep my medicine? This drug is given in a hospital or clinic and  will not be stored at home. NOTE: This sheet is a summary. It may not cover all possible information. If you have questions about this medicine, talk to your doctor, pharmacist, or health care provider.   2018 Elsevier/Gold Standard (2015-09-03 08:34:51)

## 2017-07-05 NOTE — Progress Notes (Signed)
Albert Hogan is a 46 y.o. male who presents to [place] today for right knee pain and swelling.  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  His last injection was about 3 months ago.  The injection lasted about 2 months.  He notes pain and swelling which interferes with his ability to exercise normally.  Past Medical History:  Diagnosis Date  . ED (erectile dysfunction)   . Hypertension    Past Surgical History:  Procedure Laterality Date  . APPENDECTOMY  2001  . HERNIA REPAIR     Social History   Tobacco Use  . Smoking status: Never Smoker  . Smokeless tobacco: Never Used  Substance Use Topics  . Alcohol use: Not on file     ROS:  As above   Medications: Current Outpatient Medications  Medication Sig Dispense Refill  . amLODipine (NORVASC) 5 MG tablet Take 1 tablet (5 mg total) by mouth daily. 90 tablet 1  . diclofenac sodium (VOLTAREN) 1 % GEL Apply 4 g topically 4 (four) times daily. To affected joint. 100 g 11  . EPINEPHrine (EPIPEN 2-PAK) 0.3 mg/0.3 mL IJ SOAJ injection Inject 0.3 mLs (0.3 mg total) into the muscle once. 2 Device 0  . olmesartan-hydrochlorothiazide (BENICAR HCT) 40-12.5 MG tablet TAKE 1 TABLET BY MOUTH ONCE DAILY NEED  APPOINTMENT  FOR  REFILLS 30 tablet 0  . sertraline (ZOLOFT) 100 MG tablet Take 1 tablet (100 mg total) by mouth daily. 90 tablet 3  . tadalafil (CIALIS) 20 MG tablet TAKE ONE TABLET BY MOUTH ONCE DAILY AS NEEDED. 10 tablet 11   No current facility-administered medications for this visit.    Allergies  Allergen Reactions  . Shellfish Allergy      Exam:  BP 111/76   Pulse 90   Temp 97.7 F (36.5 C) (Oral)   Resp 16   Ht 6\' 1"  (1.854 m)   Wt 285 lb (129.3 kg)   SpO2 99%   BMI 37.60 kg/m    Wt Readings from Last 10 Encounters:  07/05/17 285 lb (129.3 kg)  04/21/17 263 lb (119.3 kg)  04/07/17 264 lb (119.7 kg)    01/26/17 272 lb (123.4 kg)  07/06/16 261 lb (118.4 kg)  04/21/16 254 lb (115.2 kg)  03/24/16 261 lb (118.4 kg)  12/29/15 254 lb (115.2 kg)  12/17/15 250 lb (113.4 kg)  12/09/15 251 lb (113.9 kg)    General: Well Developed, well nourished, and in no acute distress.  Neuro/Psych: Alert and oriented x3, extra-ocular muscles intact, able to move all 4 extremities, sensation grossly intact. Skin: Warm and dry, no rashes noted.  Respiratory: Not using accessory muscles, speaking in full sentences, trachea midline.  Cardiovascular: Pulses palpable, no extremity edema. Abdomen: Does not appear distended. MSK: Right knee moderate effusion nontender normal motion.   Procedure: Real-time Ultrasound Guided Injection of right knee  Device: GE Logiq E  Images permanently stored and available for review in the ultrasound unit. Verbal informed consent obtained. Discussed risks and benefits of procedure. Warned about infection bleeding damage to structures skin hypopigmentation and fat atrophy among others. Patient expresses understanding and agreement Time-out conducted.  Noted no overlying erythema, induration, or other signs of local infection.  Skin prepped in a sterile fashion.  Local anesthesia: Topical Ethyl chloride.  With sterile technique and under real time ultrasound guidance: 80mg  depomedrol and 19ml marcaine injected easily.  Completed without difficulty  Pain immediately resolved suggesting accurate placement of the medication.  Advised to call if fevers/chills, erythema, induration, drainage, or persistent bleeding.  Images permanently stored and available for review in the ultrasound unit.  Impression: Technically successful ultrasound guided injection.      No results found for this or any previous visit (from the past 48 hour(s)). No results found.    Assessment and Plan: 46 y.o. 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  Plan  for steroid injection and recheck as needed.  Work on weight loss and quad strengthening as well    No orders of the defined types were placed in this encounter.  No orders of the defined types were placed in this encounter.   Discussed warning signs or symptoms. Please see discharge instructions. Patient expresses understanding.

## 2017-09-20 ENCOUNTER — Telehealth: Payer: Self-pay

## 2017-09-20 NOTE — Telephone Encounter (Signed)
Patient called stated that he injuried his back on Monday playing golf. He stated that he dropped something this morning and went to pick it up and he fell to the floor due to the pain. Patient is scheduled to come in tomorrow to be evaluated if he can walk in but he is requesting a Rx for a muscle relaxer to get him through. Please advise. Jonica Bickhart,CMA

## 2017-09-21 ENCOUNTER — Encounter: Payer: Self-pay | Admitting: Family Medicine

## 2017-09-21 ENCOUNTER — Ambulatory Visit (INDEPENDENT_AMBULATORY_CARE_PROVIDER_SITE_OTHER): Payer: No Typology Code available for payment source | Admitting: Family Medicine

## 2017-09-21 DIAGNOSIS — S39012A Strain of muscle, fascia and tendon of lower back, initial encounter: Secondary | ICD-10-CM | POA: Insufficient documentation

## 2017-09-21 MED ORDER — CYCLOBENZAPRINE HCL 10 MG PO TABS: 10.0000 mg | ORAL_TABLET | Freq: Three times a day (TID) | ORAL | 0 refills | Status: DC | PRN

## 2017-09-21 NOTE — Telephone Encounter (Signed)
Patient has been advised. Rhonda Cunningham,CMA  

## 2017-09-21 NOTE — Telephone Encounter (Signed)
Flexeril sent to pharmacy.

## 2017-09-21 NOTE — Progress Notes (Signed)
BACK INJURY

## 2017-09-21 NOTE — Patient Instructions (Signed)
Thank you for coming in today. Continue ibuprofen up to 600-800mg  every 8 hours.  Use flexeril as needed sparingly.  Use a heating pad and TENS unit.  Attend PT.  Come back or go to the emergency room if you notice new weakness new numbness problems walking or bowel or bladder problems.  TENS UNIT: This is helpful for muscle pain and spasm.   Search and Purchase a TENS 7000 2nd edition at  www.tenspros.com or www.[email or web address].com It should be less than $30.     TENS unit instructions: Do not shower or bathe with the unit on Turn the unit off before removing electrodes or batteries If the electrodes lose stickiness add a drop of water to the electrodes after they are disconnected from the unit and place on plastic sheet. If you continued to have difficulty, call the TENS unit company to purchase more electrodes. Do not apply lotion on the skin area prior to use. Make sure the skin is clean and dry as this will help prolong the life of the electrodes. After use, always check skin for unusual red areas, rash or other skin difficulties. If there are any skin problems, does not apply electrodes to the same area. Never remove the electrodes from the unit by pulling the wires. Do not use the TENS unit or electrodes other than as directed. Do not change electrode placement without consultating your therapist or physician. Keep 2 fingers with between each electrode. Wear time ratio is 2:1, on to off times.    For example on for 30 minutes off for 15 minutes and then on for 30 minutes off for 15 minutes    Lumbosacral Strain Lumbosacral strain is an injury that causes pain in the lower back (lumbosacral spine). This injury usually occurs from overstretching the muscles or ligaments along your spine. A strain can affect one or more muscles or cord-like tissues that connect bones to other bones (ligaments). What are the causes? This condition may be caused by:  A hard, direct hit (blow) to the  back.  Excessive stretching of the lower back muscles. This may result from: ? A fall. ? Lifting something heavy. ? Repetitive movements such as bending or crouching.  What increases the risk? The following factors may increase your risk of getting this condition:  Participating in sports or activities that involve: ? A sudden twist of the back. ? Pushing or pulling motions.  Being overweight or obese.  Having poor strength and flexibility, especially tight hamstrings or weak muscles in the back or abdomen.  Having too much of a curve in the lower back.  Having a pelvis that is tilted forward.  What are the signs or symptoms? The main symptom of this condition is pain in the lower back, at the site of the strain. Pain may extend (radiate) down one or both legs. How is this diagnosed? This condition is diagnosed based on:  Your symptoms.  Your medical history.  A physical exam. ? Your health care provider may push on certain areas of your back to determine the source of your pain. ? You may be asked to bend forward, backward, and side to side to assess the severity of your pain and your range of motion.  Imaging tests, such as: ? X-rays. ? MRI.  How is this treated? Treatment for this condition may include:  Putting heat and cold on the affected area.  Medicines to help relieve pain and relax your muscles (muscle relaxants).  NSAIDs to help  reduce swelling and discomfort.  When your symptoms improve, it is important to gradually return to your normal routine as soon as possible to reduce pain, avoid stiffness, and avoid loss of muscle strength. Generally, symptoms should improve within 6 weeks of treatment. However, recovery time varies. Follow these instructions at home: Managing pain, stiffness, and swelling   If directed, put ice on the injured area during the first 24 hours after your strain. ? Put ice in a plastic bag. ? Place a towel between your skin and  the bag. ? Leave the ice on for 20 minutes, 2-3 times a day.  If directed, put heat on the affected area as often as told by your health care provider. Use the heat source that your health care provider recommends, such as a moist heat pack or a heating pad. ? Place a towel between your skin and the heat source. ? Leave the heat on for 20-30 minutes. ? Remove the heat if your skin turns bright red. This is especially important if you are unable to feel pain, heat, or cold. You may have a greater risk of getting burned. Activity  Rest and return to your normal activities as told by your health care provider. Ask your health care provider what activities are safe for you.  Avoid activities that take a lot of energy for as long as told by your health care provider. General instructions  Take over-the-counter and prescription medicines only as told by your health care provider.  Donot drive or use heavy machinery while taking prescription pain medicine.  Do not use any products that contain nicotine or tobacco, such as cigarettes and e-cigarettes. If you need help quitting, ask your health care provider.  Keep all follow-up visits as told by your health care provider. This is important. How is this prevented?  Use correct form when playing sports and lifting heavy objects.  Use good posture when sitting and standing.  Maintain a healthy weight.  Sleep on a mattress with medium firmness to support your back.  Be safe and responsible while being active to avoid falls.  Do at least 150 minutes of moderate-intensity exercise each week, such as brisk walking or water aerobics. Try a form of exercise that takes stress off your back, such as swimming or stationary cycling.  Maintain physical fitness, including: ? Strength. ? Flexibility. ? Cardiovascular fitness. ? Endurance. Contact a health care provider if:  Your back pain does not improve after 6 weeks of treatment.  Your  symptoms get worse. Get help right away if:  Your back pain is severe.  You cannot stand or walk.  You have difficulty controlling when you urinate or when you have a bowel movement.  You feel nauseous or you vomit.  Your feet get very cold.  You have numbness, tingling, weakness, or problems using your arms or legs.  You develop any of the following: ? Shortness of breath. ? Dizziness. ? Pain in your legs. ? Weakness in your buttocks or legs. ? Discoloration of the skin on your toes or legs. This information is not intended to replace advice given to you by your health care provider. Make sure you discuss any questions you have with your health care provider. Document Released: 05/11/2005 Document Revised: 02/19/2016 Document Reviewed: 01/03/2016 Elsevier Interactive Patient Education  [employer].

## 2017-09-21 NOTE — Progress Notes (Signed)
Albert Hogan is a 47 y.o. male who presents to [facility] today for back pain.  Albert Hogan note acute onset of left low back pain.  The pain started on Monday (3 days ago) with golfing.  It worsened yesterday when he bent over in the shower developing severe pain.  He denies any radiating pain weakness or numbness or bowel or bladder dysfunction.  He is tried ibuprofen which helps a little.  I sent him in Flexeril this morning but he has not had time to take it yet.  He missed work yesterday and today.   Past Medical History:  Diagnosis Date  . ED (erectile dysfunction)   . Hypertension    Past Surgical History:  Procedure Laterality Date  . APPENDECTOMY  2001  . HERNIA REPAIR     Social History   Tobacco Use  . Smoking status: Never Smoker  . Smokeless tobacco: Never Used  Substance Use Topics  . Alcohol use: Not on file     ROS:  As above   Medications: Current Outpatient Medications  Medication Sig Dispense Refill  . amLODipine (NORVASC) 5 MG tablet Take 1 tablet (5 mg total) by mouth daily. 90 tablet 1  . cyclobenzaprine (FLEXERIL) 10 MG tablet Take 1 tablet (10 mg total) by mouth 3 (three) times daily as needed for muscle spasms. 30 tablet 0  . diclofenac sodium (VOLTAREN) 1 % GEL Apply 4 g topically 4 (four) times daily. To affected joint. 100 g 11  . EPINEPHrine (EPIPEN 2-PAK) 0.3 mg/0.3 mL IJ SOAJ injection Inject 0.3 mLs (0.3 mg total) into the muscle once. 2 Device 0  . olmesartan-hydrochlorothiazide (BENICAR HCT) 40-12.5 MG tablet TAKE 1 TABLET BY MOUTH ONCE DAILY NEED  APPOINTMENT  FOR  REFILLS 30 tablet 0  . sertraline (ZOLOFT) 100 MG tablet Take 1 tablet (100 mg total) by mouth daily. 90 tablet 3  . tadalafil (CIALIS) 20 MG tablet TAKE ONE TABLET BY MOUTH ONCE DAILY AS NEEDED. 10 tablet 11   No current facility-administered medications for this visit.    Allergies  Allergen Reactions  . Shellfish Allergy       Exam:  BP 116/80   Pulse 98   Ht 6\' 1"  (1.854 m)   Wt 261 lb (118.4 kg)   BMI 34.43 kg/m  General: Well Developed, well nourished, and in no acute distress.  Neuro/Psych: Alert and oriented x3, extra-ocular muscles intact, able to move all 4 extremities, sensation grossly intact. Skin: Warm and dry, no rashes noted.  Respiratory: Not using accessory muscles, speaking in full sentences, trachea midline.  Cardiovascular: Pulses palpable, no extremity edema. Abdomen: Does not appear distended. MSK: L-spine: Nontender to midline.  Tender palpation left lower lumbar spinal muscle group. Back motion is impaired especially with flexion.  Normal rotation and lateral flexion but painful with left lateral flexion Lower extremity strength sensation reflexes are equal and normal throughout. Antalgic gait present    No results found for this or any previous visit (from the past 48 hour(s)). No results found.    Assessment and Plan: 47 y.o. male with lumbosacral strain of the left low back.  Plan for referral to physical therapy ibuprofen or naproxen as well as Flexeril.  Will use heating pad and TENS unit.  Recheck if not better.  Work note provided.    Orders Placed This Encounter  Procedures  . Ambulatory referral to Physical Therapy    Referral Priority:   Routine  Referral Type:   Physical Medicine    Referral Reason:   Specialty Services Required    Requested Specialty:   Physical Therapy   No orders of the defined types were placed in this encounter.   Discussed warning signs or symptoms. Please see discharge instructions. Patient expresses understanding.

## 2017-09-27 ENCOUNTER — Ambulatory Visit: Payer: No Typology Code available for payment source | Admitting: Rehabilitative and Restorative Service Providers"

## 2017-10-02 ENCOUNTER — Ambulatory Visit: Payer: No Typology Code available for payment source | Admitting: Family Medicine

## 2017-10-02 ENCOUNTER — Encounter: Payer: Self-pay | Admitting: Family Medicine

## 2017-10-02 ENCOUNTER — Ambulatory Visit (INDEPENDENT_AMBULATORY_CARE_PROVIDER_SITE_OTHER): Payer: No Typology Code available for payment source

## 2017-10-02 VITALS — BP 139/86 | HR 128 | Ht 73.0 in | Wt 260.0 lb

## 2017-10-02 DIAGNOSIS — M5442 Lumbago with sciatica, left side: Secondary | ICD-10-CM | POA: Diagnosis not present

## 2017-10-02 DIAGNOSIS — M4807 Spinal stenosis, lumbosacral region: Secondary | ICD-10-CM

## 2017-10-02 MED ORDER — PREDNISONE 5 MG (48) PO TBPK: ORAL_TABLET | ORAL | 0 refills | Status: DC

## 2017-10-02 MED ORDER — HYDROCODONE-ACETAMINOPHEN 5-325 MG PO TABS: 1.0000 | ORAL_TABLET | Freq: Four times a day (QID) | ORAL | 0 refills | Status: DC | PRN

## 2017-10-02 MED ORDER — GABAPENTIN 300 MG PO CAPS: ORAL_CAPSULE | ORAL | 3 refills | Status: DC

## 2017-10-02 NOTE — Progress Notes (Signed)
Albert Hogan is a 47 y.o. male who presents to [facility] today for worsening back pain with new right-sided radiating pain down his right leg.  Albert Hogan was seen last week for back pain thought to be due to lumbosacral strain.  He was prescribed muscle relaxants heating pad and relative rest.  He was doing okay until about 4 days ago when his pain suddenly worsened and started shooting down his right leg.  He denies any bowel or bladder dysfunction but does note some constipation.  He denies any leg weakness but notes the pain is very severe at times.  He denies fevers or chills nausea vomiting or diarrhea.  He notes coughing and sitting in the flexed position worsens his leg pain.   Past Medical History:  Diagnosis Date  . ED (erectile dysfunction)   . Hypertension    Past Surgical History:  Procedure Laterality Date  . APPENDECTOMY  2001  . HERNIA REPAIR     Social History   Tobacco Use  . Smoking status: Never Smoker  . Smokeless tobacco: Never Used  Substance Use Topics  . Alcohol use: Not on file     ROS:  As above   Medications: Current Outpatient Medications  Medication Sig Dispense Refill  . amLODipine (NORVASC) 5 MG tablet Take 1 tablet (5 mg total) by mouth daily. 90 tablet 1  . cyclobenzaprine (FLEXERIL) 10 MG tablet Take 1 tablet (10 mg total) by mouth 3 (three) times daily as needed for muscle spasms. 30 tablet 0  . diclofenac sodium (VOLTAREN) 1 % GEL Apply 4 g topically 4 (four) times daily. To affected joint. 100 g 11  . EPINEPHrine (EPIPEN 2-PAK) 0.3 mg/0.3 mL IJ SOAJ injection Inject 0.3 mLs (0.3 mg total) into the muscle once. 2 Device 0  . olmesartan-hydrochlorothiazide (BENICAR HCT) 40-12.5 MG tablet TAKE 1 TABLET BY MOUTH ONCE DAILY NEED  APPOINTMENT  FOR  REFILLS 30 tablet 0  . sertraline (ZOLOFT) 100 MG tablet Take 1 tablet (100 mg total) by mouth daily. 90 tablet 3  . tadalafil (CIALIS) 20 MG tablet TAKE  ONE TABLET BY MOUTH ONCE DAILY AS NEEDED. 10 tablet 11  . gabapentin (NEURONTIN) 300 MG capsule One tab PO qHS for a week, then BID for a week, then TID. May double weekly to a max of 3,600mg /day 180 capsule 3  . HYDROcodone-acetaminophen (NORCO/VICODIN) 5-325 MG tablet Take 1 tablet by mouth every 6 (six) hours as needed. 15 tablet 0  . predniSONE (STERAPRED UNI-PAK 48 TAB) 5 MG (48) TBPK tablet 12 day dosepack po 48 tablet 0   No current facility-administered medications for this visit.    Allergies  Allergen Reactions  . Shellfish Allergy      Exam:  BP 139/86   Pulse (!) 128   Ht 6\' 1"  (1.854 m)   Wt 260 lb (117.9 kg)   BMI 34.30 kg/m  General: Well Developed, well nourished, and in no acute distress.  Neuro/Psych: Alert and oriented x3, extra-ocular muscles intact, able to move all 4 extremities, sensation grossly intact. Skin: Warm and dry, no rashes noted.  Respiratory: Not using accessory muscles, speaking in full sentences, trachea midline.  Cardiovascular: Pulses palpable, no extremity edema. Abdomen: Does not appear distended. MSK:  L-spine: Nontender to spinal midline. Tender to palpation right lumbar paraspinal muscle group. Right leg normal motion and strength. Significantly positive slump test right side. Reflexes are intact bilaterally and equal bilaterally. Sensation is intact throughout.  Normal rectal tone and strength. Antalgic gait present.   X-ray images independently reviewed by me. No results found for this or any previous visit (from the past 48 hour(s)). Dg Lumbar Spine Complete  Result Date: 10/02/2017 CLINICAL DATA:  47 year old male with low back pain and left radiculopathy for 2 months after hitting golf ball. Initial encounter. EXAM: LUMBAR SPINE - COMPLETE 4+ VIEW COMPARISON:  05/15/2013 lumbar spine plain film exam. FINDINGS: Normal alignment. Very mild L5-S1 disc space narrowing. No pars defect noted. Surgical clips project over the right  ilium. IMPRESSION: Very mild L5-S1 disc space narrowing. Electronically Signed   By: Genia Del M.D.   On: 10/02/2017 17:36      Assessment and Plan: 47 y.o. male with worsening lumbosacral strain symptoms with new onset right lumbar radicular symptoms likely L5 nerve root.  His pain is severe and he is rapidly progressing.  At this point I think is reasonable to proceed with maximal medical therapy and further diagnostic imaging.  We will treat with prednisone and gabapentin and Norco for pain control.  We will also obtain an MRI of the lumbar spine to evaluate his dramatically worsening pain and potential injection planning.  Recheck sooner if worsening.    Orders Placed This Encounter  Procedures  . DG Lumbar Spine Complete    Standing Status:   Future    Number of Occurrences:   1    Standing Expiration Date:   12/01/2018    Order Specific Question:   Reason for Exam (SYMPTOM  OR DIAGNOSIS REQUIRED)    Answer:   eval pain and likel lt L5 radiculopthy    Order Specific Question:   Preferred imaging location?    Answer:   [facility]    Order Specific Question:   Radiology Contrast Protocol - do NOT remove file path    Answer:   \\[email or web address]  . MR Lumbar Spine Wo Contrast    Standing Status:   Future    Standing Expiration Date:   12/01/2018    Order Specific Question:   What is the patient's sedation requirement?    Answer:   No Sedation    Order Specific Question:   Does the patient have a pacemaker or implanted devices?    Answer:   No    Order Specific Question:   Preferred imaging location?    Answer:   [facility] (table limit-350lbs)    Order Specific Question:   Radiology Contrast Protocol - do NOT remove file path    Answer:   \\[email or web address]   Meds ordered this encounter  Medications  . predniSONE (STERAPRED UNI-PAK 48 TAB) 5 MG (48) TBPK tablet    Sig: 12 day dosepack po     Dispense:  48 tablet    Refill:  0  . gabapentin (NEURONTIN) 300 MG capsule    Sig: One tab PO qHS for a week, then BID for a week, then TID. May double weekly to a max of 3,600mg /day    Dispense:  180 capsule    Refill:  3  . HYDROcodone-acetaminophen (NORCO/VICODIN) 5-325 MG tablet    Sig: Take 1 tablet by mouth every 6 (six) hours as needed.    Dispense:  15 tablet    Refill:  0    Discussed warning signs or symptoms. Please see discharge instructions. Patient expresses understanding.

## 2017-10-02 NOTE — Patient Instructions (Signed)
Thank you for coming in today. Attend PT.  Take prednisone Take gabapentin for pain  Use norco sparingly for pain.  Get xray now.  Get MRI if not better.  Return sooner if worsening.   Come back or go to the emergency room if you notice new weakness new numbness problems walking or bowel or bladder problems.    Sciatica Sciatica is pain, numbness, weakness, or tingling along the path of the sciatic nerve. The sciatic nerve starts in the lower back and runs down the back of each leg. The nerve controls the muscles in the lower leg and in the back of the knee. It also provides feeling (sensation) to the back of the thigh, the lower leg, and the sole of the foot. Sciatica is a symptom of another medical condition that pinches or puts pressure on the sciatic nerve. Generally, sciatica only affects one side of the body. Sciatica usually goes away on its own or with treatment. In some cases, sciatica may keep coming back (recur). What are the causes? This condition is caused by pressure on the sciatic nerve, or pinching of the sciatic nerve. This may be the result of:  A disk in between the bones of the spine (vertebrae) bulging out too far (herniated disk).  Age-related changes in the spinal disks (degenerative disk disease).  A pain disorder that affects a muscle in the buttock (piriformis syndrome).  Extra bone growth (bone spur) near the sciatic nerve.  An injury or break (fracture) of the pelvis.  Pregnancy.  Tumor (rare).  What increases the risk? The following factors may make you more likely to develop this condition:  Playing sports that place pressure or stress on the spine, such as football or weight lifting.  Having poor strength and flexibility.  A history of back injury.  A history of back surgery.  Sitting for long periods of time.  Doing activities that involve repetitive bending or lifting.  Obesity.  What are the signs or symptoms? Symptoms can vary from  mild to very severe, and they may include:  Any of these problems in the lower back, leg, hip, or buttock: ? Mild tingling or dull aches. ? Burning sensations. ? Sharp pains.  Numbness in the back of the calf or the sole of the foot.  Leg weakness.  Severe back pain that makes movement difficult.  These symptoms may get worse when you cough, sneeze, or laugh, or when you sit or stand for long periods of time. Being overweight may also make symptoms worse. In some cases, symptoms may recur over time. How is this diagnosed? This condition may be diagnosed based on:  Your symptoms.  A physical exam. Your health care provider may ask you to do certain movements to check whether those movements trigger your symptoms.  You may have tests, including: ? Blood tests. ? X-rays. ? MRI. ? CT scan.  How is this treated? In many cases, this condition improves on its own, without any treatment. However, treatment may include:  Reducing or modifying physical activity during periods of pain.  Exercising and stretching to strengthen your abdomen and improve the flexibility of your spine.  Icing and applying heat to the affected area.  Medicines that help: ? To relieve pain and swelling. ? To relax your muscles.  Injections of medicines that help to relieve pain, irritation, and inflammation around the sciatic nerve (steroids).  Surgery.  Follow these instructions at home: Medicines  Take over-the-counter and prescription medicines only as  told by your health care provider.  Do not drive or operate heavy machinery while taking prescription pain medicine. Managing pain  If directed, apply ice to the affected area. ? Put ice in a plastic bag. ? Place a towel between your skin and the bag. ? Leave the ice on for 20 minutes, 2-3 times a day.  After icing, apply heat to the affected area before you exercise or as often as told by your health care provider. Use the heat source that  your health care provider recommends, such as a moist heat pack or a heating pad. ? Place a towel between your skin and the heat source. ? Leave the heat on for 20-30 minutes. ? Remove the heat if your skin turns bright red. This is especially important if you are unable to feel pain, heat, or cold. You may have a greater risk of getting burned. Activity  Return to your normal activities as told by your health care provider. Ask your health care provider what activities are safe for you. ? Avoid activities that make your symptoms worse.  Take brief periods of rest throughout the day. Resting in a lying or standing position is usually better than sitting to rest. ? When you rest for longer periods, mix in some mild activity or stretching between periods of rest. This will help to prevent stiffness and pain. ? Avoid sitting for long periods of time without moving. Get up and move around at least one time each hour.  Exercise and stretch regularly, as told by your health care provider.  Do not lift anything that is heavier than 10 lb (4.5 kg) while you have symptoms of sciatica. When you do not have symptoms, you should still avoid heavy lifting, especially repetitive heavy lifting.  When you lift objects, always use proper lifting technique, which includes: ? Bending your knees. ? Keeping the load close to your body. ? Avoiding twisting. General instructions  Use good posture. ? Avoid leaning forward while sitting. ? Avoid hunching over while standing.  Maintain a healthy weight. Excess weight puts extra stress on your back and makes it difficult to maintain good posture.  Wear supportive, comfortable shoes. Avoid wearing high heels.  Avoid sleeping on a mattress that is too soft or too hard. A mattress that is firm enough to support your back when you sleep may help to reduce your pain.  Keep all follow-up visits as told by your health care provider. This is important. Contact a  health care provider if:  You have pain that wakes you up when you are sleeping.  You have pain that gets worse when you lie down.  Your pain is worse than you have experienced in the past.  Your pain lasts longer than 4 weeks.  You experience unexplained weight loss. Get help right away if:  You lose control of your bowel or bladder (incontinence).  You have: ? Weakness in your lower back, pelvis, buttocks, or legs that gets worse. ? Redness or swelling of your back. ? A burning sensation when you urinate. This information is not intended to replace advice given to you by your health care provider. Make sure you discuss any questions you have with your health care provider. Document Released: 07/26/2001 Document Revised: 01/05/2016 Document Reviewed: 04/10/2015 Elsevier Interactive Patient Education  [employer].

## 2017-10-03 ENCOUNTER — Encounter: Payer: Self-pay | Admitting: Rehabilitative and Restorative Service Providers"

## 2017-10-03 ENCOUNTER — Ambulatory Visit: Payer: No Typology Code available for payment source | Admitting: Rehabilitative and Restorative Service Providers"

## 2017-10-03 DIAGNOSIS — M5442 Lumbago with sciatica, left side: Secondary | ICD-10-CM | POA: Diagnosis not present

## 2017-10-03 DIAGNOSIS — R29898 Other symptoms and signs involving the musculoskeletal system: Secondary | ICD-10-CM

## 2017-10-03 DIAGNOSIS — R2689 Other abnormalities of gait and mobility: Secondary | ICD-10-CM | POA: Diagnosis not present

## 2017-10-03 NOTE — Therapy (Signed)
[place] [facility] [place] [place], [place], [address] Phone: [phone]   Fax:  [phone]  Physical Therapy Evaluation  Patient Details  Name: Albert Hogan MRN: [id number] Date of Birth: 1971-02-13 Referring Provider: Dr Lynne Leader    Encounter Date: 10/03/2017  PT End of Session - 10/03/17 1225    Visit Number  1    Number of Visits  12    Date for PT Re-Evaluation  11/14/17    PT Start Time  1103    PT Stop Time  1218    PT Time Calculation (min)  75 min    Activity Tolerance  Patient tolerated treatment well;Patient limited by pain       Past Medical History:  Diagnosis Date  . ED (erectile dysfunction)   . Hypertension     Past Surgical History:  Procedure Laterality Date  . APPENDECTOMY  2001  . HERNIA REPAIR      There were no vitals filed for this visit.   Subjective Assessment - 10/03/17 1114    Subjective  Patient reports that he was playing golf ~ 3 weeks ago and started having LBP after a swing. He has severe pain in LB - when he sat to drive for 2 hours the Lt hip started hurting and the LBP was intense. Pain has persisted from then.     Pertinent History  LBP following injury ~25 yrs ago with episodic flare ups of LBP requiring treatment. Symptoms typically reslove in 2-4 wks with ice, meds, rest. Flare ups have been 3-4 times/yrs. HTN. Lt medial meniscectomy ~2 yrs ago     How long can you sit comfortably?  30 min     How long can you stand comfortably?  30 min     How long can you walk comfortably?  2-5 min     Patient Stated Goals  get rid of the pain in the LB and Lt LE; return to golf     Currently in Pain?  Yes    Pain Score  9     Pain Location  Hip    Pain Orientation  Left    Pain Descriptors / Indicators  Stabbing;Sharp;Aching    Pain Type  Acute pain    Pain Radiating Towards  Lt buttocks to posterior thigh to knee     Pain Onset  1 to 4 weeks ago    Pain Frequency   Intermittent    Aggravating Factors   prolonged postures/postions; moving from sit to stand; walking    Pain Relieving Factors  ice; meds         [facility] PT Assessment - 10/03/17 0001      Assessment   Medical Diagnosis  Lt sciatica    Referring Provider  Dr Lynne Leader     Onset Date/Surgical Date  09/11/17    Hand Dominance  Right    Next MD Visit  PRN     Prior Therapy  yes       Precautions   Precautions  None      Balance Screen   Has the patient fallen in the past 6 months  Yes    How many times?  2    Has the patient had a decrease in activity level because of a fear of falling?   No    Is the patient reluctant to leave their home because of a fear of falling?   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     Leisure  exercise a couple times a week; son in [place]; golf       Observation/Other Assessments   Focus on Therapeutic Outcomes (FOTO)   71% limitation       Sensation   Additional Comments  numbness and tingling into the Lt great toe and 2nd/3rd toes a couple of days ago lasting about a day - has not returned       Posture/Postural Control   Posture Comments  head forward; shoulders rounded and elevated; flexed forward at hips and trunk; weight shifted to the Rt       AROM   Overall AROM Comments  unable to evaluate trunk and LE mobility and ROM due to pain     Right/Left Hip  -- limited and painful Lt > Rt       Strength   Overall Strength Comments  pt moves all limbs against gravity - unable to tolerate resistive strength testing due to LB and Lt LE pain       Flexibility   Soft Tissue Assessment /Muscle Length  -- unable to fully assess due to pain     Hamstrings  tight Rt 55 deg; Lt spasms and tightness at ~35  deg       Palpation   Spinal mobility  unable to assess due to pain - pt unable to lie prone     SI assessment   elevated skin fold on Lt compared to Rt; Lt PSIS elevated; Lt hemipelvis elevater and posterior in orientation compared to Rt     Palpation comment  significant hypersensitivity to any palpation       Special Tests   Other special tests  unable to assess SLR; [place]       Transfers   Comments  difficulty with all transitioinal movements       Ambulation/Gait   Gait Comments  antalgic gait patient with flexed posture; cane in Rt UE with decreased wt shift to Lt              Objective measurements completed on examination: See above findings.      [facility] Adult PT Treatment/Exercise - 10/03/17 0001      Self-Care   Self-Care  -- education re musculoskeletal injuries/movement/ex/etc      Neuro Re-ed    Neuro Re-ed Details   working on posture and alignment w/ verbal and tactile cues       Lumbar Exercises: Stretches   Passive Hamstring Stretch  Left;4 reps;30 seconds supine w/ strap - starting from LE's supported on 2 bolsters      Lumbar Exercises: Supine   Other Supine Lumbar Exercises  diaphragmatic breathing in through nose/out through mouth x 10; 3 part core 10 sec x 10 to pt's tolerance with contraction    Other Supine Lumbar Exercises  shoulder flexion alternating UE's x 10 reps       Cryotherapy   Number Minutes Cryotherapy  12 Minutes    Cryotherapy Location  Lumbar Spine;Hip LB and Lt posterior hip/thigh     Type of Cryotherapy  Ice pack  PT Education - 10/03/17 1156    Education provided  Yes    Education Details  HEP     Person(s) Educated  Patient    Methods  Explanation;Demonstration;Tactile cues;Verbal cues;Handout    Comprehension  Verbalized understanding;Returned demonstration;Verbal cues required;Tactile cues required          PT Long Term Goals - 10/03/17 1232      PT LONG TERM GOAL #1   Title  Improve giat  pattern with patient ambulating with good gait pattern, equal wt shift and without assistive device 11/14/17    Time  6    Period  Weeks    Status  New      PT LONG TERM GOAL #2   Title  ROM in trunk and bilat LE's to be WFL's 11/14/17    Time  6    Period  Weeks    Status  New      PT LONG TERM GOAL #3   Title  Decrease pain by 50-75% allowing patient to return to all normal functional and work activities 11/14/17    Time  6    Period  Weeks    Status  New      PT LONG TERM GOAL #4   Title  I in HEP including core stabilization and return to gym  11/14/17    Time  6    Period  Weeks    Status  New      PT LONG TERM GOAL #5   Title  Improve FOTO to </= 45 % limitation 11/14/17    Time  6    Period  Weeks    Status  New             Plan - 10/03/17 1226    Clinical Impression Statement  Norm presents with severe LBP and Lt posterior hip/thigh pain which started 09/11/17 and has increased over the past week. Patient has poor posture and alignment; antalgic gait pattern; limited trunk and LE mobility; abnormal movement patterns; pain limiting all functional and work activities. He will benefit form PT to address problems identified.     History and Personal Factors relevant to plan of care:  recurrent LBP 3-4 episodes per year for the past 25 years    Clinical Presentation  Stable    Clinical Decision Making  Low    Rehab Potential  Good    PT Frequency  2x / week    PT Duration  6 weeks    PT Treatment/Interventions  Patient/family education;ADLs/Self Care Home Management;Cryotherapy;Electrical Stimulation;Iontophoresis 4mg /ml Dexamethasone;Moist Heat;Traction;Ultrasound;Dry needling;Manual techniques;Neuromuscular re-education;Therapeutic activities;Therapeutic exercise    PT Next Visit Plan  review HEP; continue to work on symmetry of movement with education in transfers, transitional movement, gait; progress with stretching and core stabilization as tolerated; manual work and  modalities as indicated     Oncologist with Plan of Care  Patient       Patient will benefit from skilled therapeutic intervention in order to improve the following deficits and impairments:  Postural dysfunction, Improper body mechanics, Pain, Increased muscle spasms, Increased fascial restricitons, Hypomobility, Decreased mobility, Decreased range of motion, Abnormal gait, Decreased activity tolerance  Visit Diagnosis: Acute left-sided low back pain with left-sided sciatica - Plan: PT plan of care cert/re-cert  Other symptoms and signs involving the musculoskeletal system - Plan: PT plan of care cert/re-cert  Other abnormalities of gait and mobility - Plan: PT plan of care cert/re-cert     Problem  List Patient Active Problem List   Diagnosis Date Noted  . Lumbosacral strain 09/21/2017  . BMI 34.0-34.9,adult 04/21/2017  . Finger injury, right, initial encounter 04/07/2017  . Depression, major, single episode, moderate ([place]) 04/07/2017  . Anxiety 01/26/2017  . Effusion of right knee 03/25/2016  . Osteochondral defect of femoral condyle 12/11/2015  . Erectile dysfunction 10/14/2014  . Scrotal mass 10/24/2013  . Hyperlipidemia 09/18/2013  . Degenerative disc disease, lumbar 05/15/2013  . Left carpal tunnel syndrome 12/27/2012  . Cherry angioma 12/27/2012  . Essential hypertension, benign 12/18/2008    Piya Mesch Nilda Simmer PT, MPH  10/03/2017, 12:38 PM  [facility] [place], [place], [address] Phone: [phone]   Fax:  [phone]  Name: Albert Hogan MRN: [id number] Date of Birth: 12-Feb-1971

## 2017-10-03 NOTE — Patient Instructions (Addendum)
Deep breathing    Abdominal Bracing With Pelvic Floor (Hook-Lying)    With neutral spine, tighten pelvic floor and abdominals, sucking belly button to back and tense back muscles at waist. Hold 5- 10 sec  Repeat __10_ times. Do __several_ times a day. Progress to do this in sitting, standing, walking.    Extremity Flexion (Hook-Lying)    Tighten core and slowly lower right arm over head until back begins to arch. Keep trunk rigid. Repeat __10-20__ times per set. Do __1-2__ sets per session. Do __2-3__ sessions per day.   Hamstring Step 1    Straighten left knee. Keep knee level with other knee or on bolster. Hold _30-45__ seconds. Relax knee by returning foot to start. Repeat _3__ times.

## 2017-10-06 ENCOUNTER — Ambulatory Visit: Payer: No Typology Code available for payment source | Admitting: Rehabilitative and Restorative Service Providers"

## 2017-10-06 ENCOUNTER — Encounter: Payer: Self-pay | Admitting: Rehabilitative and Restorative Service Providers"

## 2017-10-06 DIAGNOSIS — M5442 Lumbago with sciatica, left side: Secondary | ICD-10-CM

## 2017-10-06 DIAGNOSIS — R2689 Other abnormalities of gait and mobility: Secondary | ICD-10-CM | POA: Diagnosis not present

## 2017-10-06 DIAGNOSIS — R29898 Other symptoms and signs involving the musculoskeletal system: Secondary | ICD-10-CM

## 2017-10-06 NOTE — Patient Instructions (Addendum)
Strengthening: Hip Abduction - Resisted    With tubing around right leg, other side toward anchor, extend leg out from side. Repeat ___10_ times per set. Do __2-3__ sets per session. Do __1-2__ sessions per day.   Strengthening: Hip Extension - Resisted    With tubing around right ankle, face anchor and pull leg straight back. Repeat ___10_ times per set. Do _2-3___ sets per session. Do _1-2___ sessions per day.   Resisted External Rotation: in Neutral - Bilateral   PALMS UP chest up  Sit or stand, tubing in both hands, elbows at sides, bent to 90, forearms forward. Pinch shoulder blades together and rotate forearms out. Keep elbows at sides. Repeat __10__ times per set. Do _2-3___ sets per session. Do _2-3___ sessions per day.   Low Row: Standing   Face anchor, feet shoulder width apart. Palms up, pull arms back, squeezing shoulder blades together. Repeat 10__ times per set. Do 2-3__ sets per session. Do  1-2__ sessions per day. Anchor Height: Waist    Strengthening: Resisted Extension   Hold tubing in right hand, arm forward. Pull arm back, elbow straight. Repeat _10___ times per set. Do 2-3____ sets per session. Do 2-3____ sessions per day.   Bent Leg Lift (Hook-Lying)    Core stomach and slowly raise right leg __a few__ inches from floor. Keep core tight. Pause. Lower leg slowly. Repeat with left leg. Repeat ____ times per set. Do ____ sets per session. Do __1-2__ sessions per day.

## 2017-10-06 NOTE — Therapy (Signed)
[place], [place], [address] Phone: [phone]   Fax:  [phone]  Physical Therapy Treatment  Patient Details  Name: Albert Hogan MRN: [id number] Date of Birth: 1970/08/20 Referring Provider: Dr Lynne Leader    Encounter Date: 10/06/2017  PT End of Session - 10/06/17 0852    Visit Number  2    Number of Visits  12    Date for PT Re-Evaluation  11/14/17    PT Start Time  0855    PT Stop Time  0946    PT Time Calculation (min)  51 min    Activity Tolerance  Patient tolerated treatment well       Past Medical History:  Diagnosis Date  . ED (erectile dysfunction)   . Hypertension     Past Surgical History:  Procedure Laterality Date  . APPENDECTOMY  2001  . HERNIA REPAIR      There were no vitals filed for this visit.  Subjective Assessment - 10/06/17 0913    Subjective  Feeling some better. Has been working on his exercises at home and using the TENS unit at night. Can tell he is making some progress.     Currently in Pain?  Yes    Pain Score  3     Pain Location  Hip    Pain Orientation  Left    Pain Descriptors / Indicators  Tightness;Spasm    Pain Type  Acute pain    Pain Onset  1 to 4 weeks ago    Pain Frequency  Intermittent                      [facility] Adult PT Treatment/Exercise - 10/06/17 0001      Exercises   Exercises  -- instructed in HEP for stretches for hands d/t spasms       Lumbar Exercises: Standing   Scapular Retraction  Strengthening;Both;20 reps;Theraband    Theraband Level (Scapular Retraction)  Level 3 (Green)    Row  Strengthening;Both;20 reps;Theraband    Theraband Level (Row)  Level 3 (Green)    Shoulder Extension  Strengthening;Both;20 reps;Theraband    Theraband Level (Shoulder Extension)  Level 3 (Green)    Other Standing Lumbar Exercises  standing hip extension 4-5 inches alternating legs core engaged    Other Standing Lumbar  Exercises  standing hip abduction 4-5 minches leading with heel core engaged alternating legs       Lumbar Exercises: Supine   Bent Knee Raise  10 reps;1 second alternating LE's     Other Supine Lumbar Exercises  diaphragmatic breathing in through nose/out through mouth x 10; 3 part core 10 sec x 10 to pt's tolerance with contraction    Other Supine Lumbar Exercises  shoulder flexion alternating UE's x 10 reps       Cryotherapy   Number Minutes Cryotherapy  12 Minutes    Cryotherapy Location  Lumbar Spine;Hip LB and Lt posterior hip/thigh     Type of Cryotherapy  Ice pack             PT Education - 10/06/17 0908    Education provided  Yes    Education Details  HEP     Person(s) Educated  Patient    Methods  Explanation;Demonstration;Tactile cues;Verbal cues;Handout    Comprehension  Verbalized understanding;Returned demonstration;Verbal cues required;Tactile cues required          PT Long Term Goals - 10/03/17 1232  PT LONG TERM GOAL #1   Title  Improve giat pattern with patient ambulating with good gait pattern, equal wt shift and without assistive device 11/14/17    Time  6    Period  Weeks    Status  New      PT LONG TERM GOAL #2   Title  ROM in trunk and bilat LE's to be WFL's 11/14/17    Time  6    Period  Weeks    Status  New      PT LONG TERM GOAL #3   Title  Decrease pain by 50-75% allowing patient to return to all normal functional and work activities 11/14/17    Time  6    Period  Weeks    Status  New      PT LONG TERM GOAL #4   Title  I in HEP including core stabilization and return to gym  11/14/17    Time  6    Period  Weeks    Status  New      PT LONG TERM GOAL #5   Title  Improve FOTO to </= 45 % limitation 11/14/17    Time  6    Period  Weeks    Status  New            Plan - 10/06/17 0916    Clinical Impression Statement  Albert Hogan reports and demonstrates good improvement. Pain is no longer burning and sharp - now more tightness and  spasming. He is wolking without his cane. He is moving better with less pain and impproved movement patterns. he tolerates increased exercise program without difficulty. Progressing well toward - excellent response to initial intervention.     Rehab Potential  Good    PT Frequency  2x / week    PT Duration  6 weeks    PT Treatment/Interventions  Patient/family education;ADLs/Self Care Home Management;Cryotherapy;Electrical Stimulation;Iontophoresis 4mg /ml Dexamethasone;Moist Heat;Traction;Ultrasound;Dry needling;Manual techniques;Neuromuscular re-education;Therapeutic activities;Therapeutic exercise    PT Next Visit Plan  review HEP; continue to work on symmetry of movement with education in transfers, transitional movement, gait; progress with stretching and core stabilization as tolerated; manual work and modalities as indicated     Oncologist with Plan of Care  Patient       Patient will benefit from skilled therapeutic intervention in order to improve the following deficits and impairments:  Postural dysfunction, Improper body mechanics, Pain, Increased muscle spasms, Increased fascial restricitons, Hypomobility, Decreased mobility, Decreased range of motion, Abnormal gait, Decreased activity tolerance  Visit Diagnosis: Acute left-sided low back pain with left-sided sciatica  Other symptoms and signs involving the musculoskeletal system  Other abnormalities of gait and mobility     Problem List Patient Active Problem List   Diagnosis Date Noted  . Lumbosacral strain 09/21/2017  . BMI 34.0-34.9,adult 04/21/2017  . Finger injury, right, initial encounter 04/07/2017  . Depression, major, single episode, moderate ([place]) 04/07/2017  . Anxiety 01/26/2017  . Effusion of right knee 03/25/2016  . Osteochondral defect of femoral condyle 12/11/2015  . Erectile dysfunction 10/14/2014  . Scrotal mass 10/24/2013  . Hyperlipidemia 09/18/2013  . Degenerative disc disease, lumbar  05/15/2013  . Left carpal tunnel syndrome 12/27/2012  . Cherry angioma 12/27/2012  . Essential hypertension, benign 12/18/2008    Allyn Bertoni Nilda Simmer PT, MPH  10/06/2017, 9:39 AM  [facility] [place], [place], [address] Phone: [phone]   Fax:  [phone]  Name: Albert Hogan MRN: [id number] Date of Birth: 03/13/1971

## 2017-10-09 ENCOUNTER — Ambulatory Visit (INDEPENDENT_AMBULATORY_CARE_PROVIDER_SITE_OTHER): Payer: No Typology Code available for payment source

## 2017-10-09 ENCOUNTER — Telehealth: Payer: Self-pay | Admitting: Family Medicine

## 2017-10-09 DIAGNOSIS — M5432 Sciatica, left side: Secondary | ICD-10-CM

## 2017-10-09 DIAGNOSIS — M5442 Lumbago with sciatica, left side: Secondary | ICD-10-CM

## 2017-10-09 DIAGNOSIS — M5116 Intervertebral disc disorders with radiculopathy, lumbar region: Secondary | ICD-10-CM

## 2017-10-09 NOTE — Telephone Encounter (Signed)
Called [facility] and left a voice mail for them to call the patient and schedule. Rhonda Cunningham,CMA

## 2017-10-09 NOTE — Telephone Encounter (Signed)
I called Albert Hogan about his MRI findings.  His symptoms are left sided not right.  Plan for epidural steroid injection.

## 2017-10-10 ENCOUNTER — Ambulatory Visit (INDEPENDENT_AMBULATORY_CARE_PROVIDER_SITE_OTHER): Payer: No Typology Code available for payment source | Admitting: Rehabilitative and Restorative Service Providers"

## 2017-10-10 ENCOUNTER — Encounter: Payer: Self-pay | Admitting: Rehabilitative and Restorative Service Providers"

## 2017-10-10 DIAGNOSIS — R29898 Other symptoms and signs involving the musculoskeletal system: Secondary | ICD-10-CM | POA: Diagnosis not present

## 2017-10-10 DIAGNOSIS — R2689 Other abnormalities of gait and mobility: Secondary | ICD-10-CM

## 2017-10-10 DIAGNOSIS — M5442 Lumbago with sciatica, left side: Secondary | ICD-10-CM | POA: Diagnosis not present

## 2017-10-10 NOTE — Patient Instructions (Addendum)
Combination (Hook-Lying)    Tighten stomach and slowly raise left leg and lower opposite arm over head. Keep trunk rigid. Repeat __10__ times per set. Do __2-3__ sets per session. Do _1-2___ sessions per day.   Lying on stomach on firm surface - 2-5 min  As symptoms tolerate    Wall slide - can use pillow along back  Bend knees slightly  Pause  Straighten slowly  Repeat 10-20 times - core engaged  Deep breath reaching up overhead core engaged  5-10 reps - standing straight

## 2017-10-10 NOTE — Therapy (Signed)
[place], [place], [address] Phone: [phone]   Fax:  [phone]  Physical Therapy Treatment  Patient Details  Name: Albert Hogan MRN: [id number] Date of Birth: [birth year] Referring Provider: Dr Lynne Leader    Encounter Date: 10/10/2017  PT End of Session - 10/10/17 1049    Visit Number  3    Number of Visits  12    Date for PT Re-Evaluation  11/14/17    PT Start Time  1033 18 min late for appt     PT Stop Time  1115    PT Time Calculation (min)  42 min    Activity Tolerance  Patient tolerated treatment well       Past Medical History:  Diagnosis Date  . ED (erectile dysfunction)   . Hypertension     Past Surgical History:  Procedure Laterality Date  . APPENDECTOMY  2001  . HERNIA REPAIR      There were no vitals filed for this visit.  Subjective Assessment - 10/10/17 1046    Subjective  Some better. Workingon his exercises at home. Had MRI yesterday and has a buldging disc. No more spasms in hands since adding stretches. Feels better with exercises in clinic today.     Currently in Pain?  Yes    Pain Score  3     Pain Location  Hip    Pain Orientation  Left    Pain Descriptors / Indicators  Tightness    Pain Onset  1 to 4 weeks ago    Pain Frequency  Intermittent                      [facility] Adult PT Treatment/Exercise - 10/10/17 0001      Neuro Re-ed    Neuro Re-ed Details   working on posture and alignment w/ verbal and tactile cues       Lumbar Exercises: Stretches   Passive Hamstring Stretch  Left;4 reps;30 seconds      Lumbar Exercises: Standing   Functional Squats  10 reps with back on large green therapy ball - core engaged     Other Standing Lumbar Exercises  standing hip extension 4-5 inches alternating legs core engaged    Other Standing Lumbar Exercises  reaching overhead with deep breathing controlled breathing - x 10       Lumbar Exercises:  Supine   Bent Knee Raise  10 reps;1 second alternating LE's     Dead Bug  10 reps core engaged     Other Supine Lumbar Exercises  diaphragmatic breathing in through nose/out through mouth x 10; 3 part core 10 sec x 10 to pt's tolerance with contraction    Other Supine Lumbar Exercises  shoulder flexion alternating UE's x 10 reps       Lumbar Exercises: Prone   Other Prone Lumbar Exercises  lying prone 4-5 min     Other Prone Lumbar Exercises  partial knee flexion 10 reps each leg       Cryotherapy   Number Minutes Cryotherapy  12 Minutes    Cryotherapy Location  Lumbar Spine;Hip LB and Lt posterior hip/thigh     Type of Cryotherapy  Ice pack             PT Education - 10/10/17 1122    Education provided  Yes    Education Details  HEP     Person(s) Educated  Patient  Methods  Explanation;Demonstration;Tactile cues;Verbal cues;Handout    Comprehension  Verbalized understanding;Returned demonstration;Verbal cues required;Tactile cues required          PT Long Term Goals - 10/10/17 1048      PT LONG TERM GOAL #1   Title  Improve giat pattern with patient ambulating with good gait pattern, equal wt shift and without assistive device 11/14/17    Time  6    Period  Weeks    Status  Partially Met      PT LONG TERM GOAL #2   Title  ROM in trunk and bilat LE's to be WFL's 11/14/17    Time  6    Period  Weeks    Status  On-going      PT LONG TERM GOAL #3   Title  Decrease pain by 50-75% allowing patient to return to all normal functional and work activities 11/14/17    Time  6    Period  Weeks    Status  On-going      PT LONG TERM GOAL #4   Title  I in HEP including core stabilization and return to gym  11/14/17    Time  6    Period  Weeks    Status  On-going      PT LONG TERM GOAL #5   Title  Improve FOTO to </= 45 % limitation 11/14/17    Time  6    Period  Weeks    Status  On-going            Plan - 10/10/17 1123    Clinical Impression Statement  Albert Hogan  reports continued gradual improvement with HEP and time. He is concerned about his MRI results. Dr T has suggested he have an ESI. Added exercises today without difficulty. Gait improving. Mobility increasing.     Rehab Potential  Good    PT Frequency  2x / week    PT Duration  6 weeks    PT Treatment/Interventions  Patient/family education;ADLs/Self Care Home Management;Cryotherapy;Electrical Stimulation;Iontophoresis 60m/ml Dexamethasone;Moist Heat;Traction;Ultrasound;Dry needling;Manual techniques;Neuromuscular re-education;Therapeutic activities;Therapeutic exercise    PT Next Visit Plan  review HEP; continue to work on symmetry of movement with education in transfers, transitional movement, gait; progress with stretching and core stabilization as tolerated; manual work and modalities as indicated     COncologistwith Plan of Care  Patient       Patient will benefit from skilled therapeutic intervention in order to improve the following deficits and impairments:  Postural dysfunction, Improper body mechanics, Pain, Increased muscle spasms, Increased fascial restricitons, Hypomobility, Decreased mobility, Decreased range of motion, Abnormal gait, Decreased activity tolerance  Visit Diagnosis: Acute left-sided low back pain with left-sided sciatica  Other symptoms and signs involving the musculoskeletal system  Other abnormalities of gait and mobility     Problem List Patient Active Problem List   Diagnosis Date Noted  . Lumbosacral strain 09/21/2017  . BMI 34.0-34.9,adult 04/21/2017  . Finger injury, right, initial encounter 04/07/2017  . Depression, major, single episode, moderate ([place] 04/07/2017  . Anxiety 01/26/2017  . Effusion of right knee 03/25/2016  . Osteochondral defect of femoral condyle 12/11/2015  . Erectile dysfunction 10/14/2014  . Scrotal mass 10/24/2013  . Hyperlipidemia 09/18/2013  . Degenerative disc disease, lumbar 05/15/2013  . Left carpal tunnel  syndrome 12/27/2012  . Cherry angioma 12/27/2012  . Essential hypertension, benign 12/18/2008    Celyn PNilda SimmerPT, MPH  10/10/2017, 11:26 AM  [facility] [address] [place], [place], [address] Phone: [phone]   Fax:  [phone]  Name: Albert Hogan MRN: [id number] Date of Birth: 1971-02-20

## 2017-10-13 ENCOUNTER — Ambulatory Visit: Payer: No Typology Code available for payment source | Admitting: Physical Therapy

## 2017-10-13 DIAGNOSIS — M5442 Lumbago with sciatica, left side: Secondary | ICD-10-CM | POA: Diagnosis not present

## 2017-10-13 DIAGNOSIS — R29898 Other symptoms and signs involving the musculoskeletal system: Secondary | ICD-10-CM

## 2017-10-13 DIAGNOSIS — R2689 Other abnormalities of gait and mobility: Secondary | ICD-10-CM | POA: Diagnosis not present

## 2017-10-13 NOTE — Therapy (Addendum)
[place], [place], [address] Phone: [phone]   Fax:  [phone]  Physical Therapy Treatment  Patient Details  Name: Albert Hogan MRN: [id number] Date of Birth: [birth year] Referring Provider: Dr. Lynne Leader   Encounter Date: 10/13/2017  PT End of Session - 10/13/17 1023    Visit Number  4    Number of Visits  12    Date for PT Re-Evaluation  11/14/17    PT Start Time  1017    PT Stop Time  1113 ice last 12 min     PT Time Calculation (min)  56 min    Activity Tolerance  Patient tolerated treatment well;No increased pain    Behavior During Therapy  WFL for tasks assessed/performed       Past Medical History:  Diagnosis Date  . ED (erectile dysfunction)   . Hypertension     Past Surgical History:  Procedure Laterality Date  . APPENDECTOMY  2001  . HERNIA REPAIR      There were no vitals filed for this visit.  Subjective Assessment - 10/13/17 1024    Subjective  Pt reports he is starting to feel better.  He tried to pick up item off the floor at work and bent over and had sharp twinge in back; resolved when he came back from upright.  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 He had radicular symptoms into Lt 5th toe while driving to [place]; resolved when he got out of car and started walking.     Patient Stated Goals  get rid of the pain in the LB and Lt LE; return to golf     Currently in Pain?  No/denies    Pain Score  0-No pain         [facility] PT Assessment - 10/13/17 0001      Assessment   Medical Diagnosis  Lt sciatica    Referring Provider  Dr. Lynne Leader    Onset Date/Surgical Date  09/11/17    Hand Dominance  Right    Next MD Visit  PRN     Prior Therapy  yes        [facility] Adult PT Treatment/Exercise - 10/13/17 0001      Lumbar Exercises: Stretches   Passive Hamstring Stretch  Right;Left;3 reps;30 seconds    Figure 4 Stretch  2 reps;30 seconds each leg, pushing down on knee.        Lumbar Exercises: Aerobic   Tread Mill  5 min at 1.4-1.7 mph, no pain.       Lumbar Exercises: Standing   Other Standing Lumbar Exercises  stand to kneel with core engaged (with feet in split squat position -Rt leg back, Lt leg forward) x 4 reps (to assist pt in being able to pick up objects off floor without reinjury);  Golfers lift with lifting LLE x 5 reps with UE support (to simulate getting laundry out of washer).        Lumbar Exercises: Supine   Clam  5 reps core engaged    Heel Slides  5 reps core engaged.     Bent Knee Raise  10 reps;1 second alternating LE's, core engaged.      Dead Bug  10 reps core engaged       Cryotherapy   Number Minutes Cryotherapy  12 Minutes    Cryotherapy Location  Lumbar Spine;Hip LB and Lt posterior hip/thigh     Type of Cryotherapy  Ice pack             PT Education - 10/13/17 1617    Education provided  Yes    Education Details  HEP, posture and body mechanics    Person(s) Educated  Patient    Methods  Explanation;Demonstration;Handout;Verbal cues;Tactile cues    Comprehension  Verbalized understanding;Returned demonstration          PT Long Term Goals - 10/13/17 1030      PT LONG TERM GOAL #1   Title  Improve gait pattern with patient ambulating with good gait pattern, equal wt shift and without assistive device 11/14/17    Time  6    Period  Weeks    Status  Achieved      PT LONG TERM GOAL #2   Title  ROM in trunk and bilat LE's to be WFL's 11/14/17    Time  6    Period  Weeks    Status  On-going      PT LONG TERM GOAL #3   Title  Decrease pain by 50-75% allowing patient to return to all normal functional and work activities 11/14/17    Time  6    Period  Weeks    Status  On-going      PT LONG TERM GOAL #4   Title  I in HEP including core stabilization and return to gym  11/14/17    Time  6    Period  Weeks    Status  On-going      PT LONG TERM GOAL #5   Title  Improve FOTO to </= 45 % limitation 11/14/17    Time   6    Period  Weeks    Status  On-going            Plan - 10/13/17 1030    Clinical Impression Statement  Pt's gait pattern much improved; has met LTG#1. He tolerated all exercises without increase in back pain.  Core activation with exercise and functional acitivity improving.     Rehab Potential  Good    PT Frequency  2x / week    PT Duration  6 weeks    PT Treatment/Interventions  Patient/family education;ADLs/Self Care Home Management;Cryotherapy;Electrical Stimulation;Iontophoresis 56m/ml Dexamethasone;Moist Heat;Traction;Ultrasound;Dry needling;Manual techniques;Neuromuscular re-education;Therapeutic activities;Therapeutic exercise    PT Next Visit Plan  continue to work on symmetry of movement with education in transfers, transitional movement, gait; progress with stretching and core stabilization as tolerated; manual work and modalities as indicated     COncologistwith Plan of Care  Patient       Patient will benefit from skilled therapeutic intervention in order to improve the following deficits and impairments:  Postural dysfunction, Improper body mechanics, Pain, Increased muscle spasms, Increased fascial restricitons, Hypomobility, Decreased mobility, Decreased range of motion, Abnormal gait, Decreased activity tolerance  Visit Diagnosis: Acute left-sided low back pain with left-sided sciatica  Other symptoms and signs involving the musculoskeletal system  Other abnormalities of gait and mobility     Problem List Patient Active Problem List   Diagnosis Date Noted  . Lumbosacral strain 09/21/2017  . BMI 34.0-34.9,adult 04/21/2017  . Finger injury, right, initial encounter 04/07/2017  . Depression, major, single episode, moderate ([place] 04/07/2017  . Anxiety 01/26/2017  . Effusion of right knee 03/25/2016  . Osteochondral defect of femoral condyle 12/11/2015  . Erectile dysfunction 10/14/2014  . Scrotal mass 10/24/2013  . Hyperlipidemia 09/18/2013  .  Degenerative disc disease, lumbar 05/15/2013  .  Left carpal tunnel syndrome 12/27/2012  . Cherry angioma 12/27/2012  . Essential hypertension, benign 12/18/2008   Kerin Perna, PTA 10/13/17 4:19 PM  [facility] [place] [place], [place], [address] Phone: [phone]   Fax:  [phone]  Name: Green Quincy MRN: [id number] Date of Birth: 11/12/1970   PHYSICAL THERAPY DISCHARGE SUMMARY  Visits from Start of Care: 4  Current functional level related to goals / functional outcomes: unknown   Remaining deficits:unknown   Education / Equipment: HEP Plan:                                                    Patient goals were partially met. Patient is being discharged due to not returning since the last visit.  ?????    Jeral Pinch, PT 11/15/17 9:01 AM

## 2017-10-13 NOTE — Patient Instructions (Signed)
Sleeping on Back  Place pillow under knees. A pillow with cervical support and a roll around waist are also helpful. Copyright  VHI. All rights reserved.  Sleeping on Side Place pillow between knees. Use cervical support under neck and a roll around waist as needed. Copyright  VHI. All rights reserved.   Sleeping on Stomach   If this is the only desirable sleeping position, place pillow under lower legs, and under stomach or chest as needed.  Posture - Sitting   Sit upright, head facing forward. Try using a roll to support lower back. Keep shoulders relaxed, and avoid rounded back. Keep hips level with knees. Avoid crossing legs for long periods. Stand to Sit / Sit to Stand   To sit: Bend knees to lower self onto front edge of chair, then scoot back on seat. To stand: Reverse sequence by placing one foot forward, and scoot to front of seat. Use rocking motion to stand up.   Work Height and Reach  Ideal work height is no more than 2 to 4 inches below elbow level when standing, and at elbow level when sitting. Reaching should be limited to arm's length, with elbows slightly bent.  Bending  Bend at hips and knees, not back. Keep feet shoulder-width apart.    Posture - Standing   Good posture is important. Avoid slouching and forward head thrust. Maintain curve in low back and align ears over shoul- ders, hips over ankles.  Alternating Positions   Alternate tasks and change positions frequently to reduce fatigue and muscle tension. Take rest breaks. Computer Work   Position work to face forward. Use proper work and seat height. Keep shoulders back and down, wrists straight, and elbows at right angles. Use chair that provides full back support. Add footrest and lumbar roll as needed.  Getting Into / Out of Car  Lower self onto seat, scoot back, then bring in one leg at a time. Reverse sequence to get out.  Dressing  Lie on back to pull socks or slacks over feet, or sit  and bend leg while keeping back straight.    Housework - Sink  Place one foot on ledge of cabinet under sink when standing at sink for prolonged periods.   Pushing / Pulling  Pushing is preferable to pulling. Keep back in proper alignment, and use leg muscles to do the work.  Deep Squat   Squat and lift with both arms held against upper trunk. Tighten stomach muscles without holding breath. Use smooth movements to avoid jerking.  Avoid Twisting   Avoid twisting or bending back. Pivot around using foot movements, and bend at knees if needed when reaching for articles.  Carrying Luggage   Distribute weight evenly on both sides. Use a cart whenever possible. Do not twist trunk. Move body as a unit.   Lifting Principles .Maintain proper posture and head alignment. .Slide object as close as possible before lifting. .Move obstacles out of the way. .Test before lifting; ask for help if too heavy. .Tighten stomach muscles without holding breath. .Use smooth movements; do not jerk. .Use legs to do the work, and pivot with feet. .Distribute the work load symmetrically and close to the center of trunk. .Push instead of pull whenever possible.   Ask For Help   Ask for help and delegate to others when possible. Coordinate your movements when lifting together, and maintain the low back curve.  Log Roll   Lying on back, bend left knee and place left   arm across chest. Roll all in one movement to the right. Reverse to roll to the left. Always move as one unit. Housework - Sweeping  Use long-handled equipment to avoid stooping.   Housework - Wiping  Position yourself as close as possible to reach work surface. Avoid straining your back.  Laundry - Unloading Wash   To unload small items at bottom of washer, lift leg opposite to arm being used to reach.  [place] close to area to be raked. Use arm movements to do the work. Keep back straight and avoid  twisting.     Cart  When reaching into cart with one arm, lift opposite leg to keep back straight.   Getting Into / Out of Bed  Lower self to lie down on one side by raising legs and lowering head at the same time. Use arms to assist moving without twisting. Bend both knees to roll onto back if desired. To sit up, start from lying on side, and use same move-ments in reverse. Housework - Vacuuming  Hold the vacuum with arm held at side. Step back and forth to move it, keeping head up. Avoid twisting.   Laundry - IT consultant so that bending and twisting can be avoided.   Laundry - Unloading Dryer  Squat down to reach into clothes dryer or use a reacher.  Gardening - Weeding / Probation officer or Kneel. Knee pads may be helpful.                    Hip External Rotation With Pillow: Transverse Plane Stability   KEEP BOTH KNEES BENT.  Slowly roll bent knee out. Be sure pelvis does not rotate. Do _10__ times. Restabilize pelvis. Repeat with other leg. Do _1-2__ sets, _1__ times per day.   Heel Slide: 4-10 Inches - Transverse Plane Stability   Slide heel 4 inches down. Be sure pelvis does not rotate. Do _10__ times. Restabilize pelvis. Repeat with other leg. Do __1-2_ sets, _1-2__ times per day.   [facility] [address] [place], [place] [address]  [phone] (office) [phone] (fax)

## 2017-10-17 ENCOUNTER — Ambulatory Visit
Admission: RE | Admit: 2017-10-17 | Discharge: 2017-10-17 | Disposition: A | Discharge status: Home / Self Care | Payer: No Typology Code available for payment source | Source: Ambulatory Visit | Attending: Family Medicine | Admitting: Family Medicine

## 2017-10-17 MED ORDER — METHYLPREDNISOLONE ACETATE 40 MG/ML INJ SUSP (RADIOLOG
120.0000 mg | Freq: Once | INTRAMUSCULAR | Status: AC
  Administered 2017-10-17: 120 mg via EPIDURAL

## 2017-10-17 MED ORDER — IOPAMIDOL (ISOVUE-M 200) INJECTION 41%
1.0000 mL | Freq: Once | INTRAMUSCULAR | Status: AC
  Administered 2017-10-17: 1 mL via EPIDURAL

## 2017-10-17 NOTE — Discharge Instructions (Signed)

## 2017-11-06 ENCOUNTER — Other Ambulatory Visit: Payer: Self-pay | Admitting: Family Medicine

## 2017-11-07 ENCOUNTER — Other Ambulatory Visit: Payer: Self-pay | Admitting: Family Medicine

## 2017-11-09 MED ORDER — OLMESARTAN MEDOXOMIL-HCTZ 40-12.5 MG PO TABS: 1.0000 | ORAL_TABLET | Freq: Every day | ORAL | 1 refills | Status: DC

## 2017-11-09 MED ORDER — CYCLOBENZAPRINE HCL 10 MG PO TABS: 10.0000 mg | ORAL_TABLET | Freq: Three times a day (TID) | ORAL | 5 refills | Status: DC | PRN

## 2017-11-09 NOTE — Telephone Encounter (Signed)
Pt requesting a refill on Benicar. Last refill was sent for a #30 in October with no RF. Please advise if refill is appropriate at this time or if pt needs OV.

## 2017-11-09 NOTE — Telephone Encounter (Signed)
Medications refilled

## 2017-11-09 NOTE — Telephone Encounter (Signed)
Pt also requesting a refill on his Flexeril. Please advise. Thanks.

## 2017-11-16 ENCOUNTER — Encounter: Payer: Self-pay | Admitting: Family Medicine

## 2017-11-16 ENCOUNTER — Ambulatory Visit (INDEPENDENT_AMBULATORY_CARE_PROVIDER_SITE_OTHER): Payer: No Typology Code available for payment source | Admitting: Family Medicine

## 2017-11-16 VITALS — BP 118/71 | HR 101 | Ht 73.0 in | Wt 264.0 lb

## 2017-11-16 DIAGNOSIS — M5417 Radiculopathy, lumbosacral region: Secondary | ICD-10-CM | POA: Diagnosis not present

## 2017-11-16 DIAGNOSIS — M25561 Pain in right knee: Secondary | ICD-10-CM

## 2017-11-16 NOTE — Patient Instructions (Addendum)
Thank you for coming in today. Continue the diclofenac gel  Ice massage.  Recheck as needed.  Let me know if you want to do the back injection again.   Work on weight loss.

## 2017-11-16 NOTE — Progress Notes (Signed)
Albert Hogan is a 47 y.o. male who presents to [place]: [place] today for right knee pain and left leg radicular symptoms.   Albert Hogan was seen recently for left leg pain thought to be left S1 radiculopathy.  He had a bulging disc present in the left one nerve root on MRI and subsequently had epidural steroid injection.  He notes considerable improvement in pain.  He notes the pain has resolved however he continues to experience some tingling or numb sensations in the lateral aspect of the foot and posterior calf.  This is not significantly bothersome but it is obnoxious.  He denies any weakness or numbness or bowel bladder dysfunction.  Additionally will he notes continued right knee symptoms.  He had an arthroscopic meniscus debridement microfracture of the right knee about 2 years ago.  He has had pain off and on in the right knee.  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  He has had joint injections as well as injections along the portal scar.   He notes over the past few weeks he has been having more bothersome pain at the right medial anterior knee.  He can feel a palpable pop with knee flexion and extension.  He notes his pain is better with knee flexion and worse with extension.  He denies any radiating pain weakness or numbness in his right leg.  He denies any recent injury.  No fevers or chills.  He is tried some diclofenac gel which has not helped much.   Past Medical History:  Diagnosis Date  . ED (erectile dysfunction)   . Hypertension    Past Surgical History:  Procedure Laterality Date  . APPENDECTOMY  2001  . HERNIA REPAIR     Social History   Tobacco Use  . Smoking status: Never Smoker  . Smokeless tobacco: Never Used  Substance Use Topics  . Alcohol use:  Not on file   family history includes Hypertension in his father and mother; Thyroid disease in his mother.  ROS as above:  Medications: Current Outpatient Medications  Medication Sig Dispense Refill  . amLODipine (NORVASC) 5 MG tablet Take 1 tablet (5 mg total) by mouth daily. 90 tablet 1  . cyclobenzaprine (FLEXERIL) 10 MG tablet Take 1 tablet (10 mg total) by mouth 3 (three) times daily as needed for muscle spasms. 30 tablet 5  . diclofenac sodium (VOLTAREN) 1 % GEL Apply 4 g topically 4 (four) times daily. To affected joint. 100 g 11  . EPINEPHrine (EPIPEN 2-PAK) 0.3 mg/0.3 mL IJ SOAJ injection Inject 0.3 mLs (0.3 mg total) into the muscle once. 2 Device 0  . gabapentin (NEURONTIN) 300 MG capsule One tab PO qHS for a week, then BID for a week, then TID. May double weekly to a max of 3,600mg /day 180 capsule 3  . olmesartan-hydrochlorothiazide (BENICAR HCT) 40-12.5 MG tablet Take 1 tablet by mouth daily. 90 tablet 1  . sertraline (ZOLOFT) 100 MG tablet Take 1 tablet (100 mg total) by mouth daily. 90 tablet 3  . tadalafil (CIALIS) 20 MG tablet TAKE ONE TABLET BY MOUTH ONCE DAILY AS NEEDED. 10 tablet 11   No current facility-administered medications for this visit.    Allergies  Allergen Reactions  . Shellfish Allergy     Health Maintenance Health Maintenance  Topic Date Due  .  INFLUENZA VACCINE  04/07/2018 (Originally 03/15/2018)  . TETANUS/TDAP  08/15/2021  . HIV Screening  Completed     Exam:  BP 118/71   Pulse (!) 101   Ht 6\' 1"  (1.854 m)   Wt 264 lb (119.7 kg)   BMI 34.83 kg/m   Wt Readings from Last 5 Encounters:  11/16/17 264 lb (119.7 kg)  10/02/17 260 lb (117.9 kg)  09/21/17 261 lb (118.4 kg)  07/05/17 285 lb (129.3 kg)  04/21/17 263 lb (119.3 kg)    Gen: Well NAD Right Knee: Well-appearing without effusion.  Mature appearing portal scars. Range of motion 0-120 degrees. Tender to palpation at the medial portal scar with palpable click with knee extension  when palpated overlying the femoral condyle. Otherwise knee is nontender. Stable ligamentous exam. Intact flexion and extension strength. L-spine: Nontender normal motion normal gait   Limited musculoskeletal ultrasound of the right anterior medial knee. Portal scar visualized normal-appearing. Dynamic motion with the probe position overlying the anterior medial knee point of tenderness there is a palpable clump of tissue that snaps over the femur with knee extension and flexion.  There are areas of hyperechoic change within this area and increased Doppler activity. No knee effusion present.  Bony structures are otherwise normal-appearing  Procedure: Real-time Ultrasound Guided Injection of right knee Hoffa fat pad scar Device: GE Logiq E   Images permanently stored and available for review in the ultrasound unit. Verbal informed consent obtained.  Discussed risks and benefits of procedure. Warned about infection bleeding damage to structures skin hypopigmentation and fat atrophy among others. Patient expresses understanding and agreement Time-out conducted.   Noted no overlying erythema, induration, or other signs of local infection.   Skin prepped in a sterile fashion.   Local anesthesia: Topical Ethyl chloride.   With sterile technique and under real time ultrasound guidance:  40 mg of Kenalog and 4 mL of Marcaine injected into the area of palpable snap seen on ultrasound achieving good distention of the surrounding tissue. Completed without difficulty    Pain immediately resolved suggesting accurate placement of the medication.   Advised to call if fevers/chills, erythema, induration, drainage, or persistent bleeding.   Images permanently stored and available for review in the ultrasound unit.  Impression: Technically successful ultrasound guided injection.  L-spine MRI from 10/09/17 images reviewed the patient in the room. Surgery report dated April 2017 reviewed  Assessment and  Plan: 47 y.o. male with right knee pain.  This is likely an area of Hoffa fat pad or portal scar that is snapping over the femur with knee extension.  This is clearly seen on ultrasound.  After ultrasound-guided injection of the tissue in this area patient had immediate pain relief supports the diagnosis.  And for ice, massage, and continued diclofenac gel.  Recheck in the near future.  Residual left leg radicular symptoms: Discussed options.  Plan for a bit of watchful waiting if not improving repeat epidural steroid injection.  Recommend advancing activity as tolerated.  Initially we discussed weight loss.  The best evidence for managing knee arthritis is quad strengthening and weight loss.  Discussed strategies including careful eating.  Lastly we also discussed total knee replacement.  He is a good candidate for it but I recommend waiting longer if possible.    Discussed warning signs or symptoms. Please see discharge instructions. Patient expresses understanding.

## 2018-01-31 ENCOUNTER — Encounter: Payer: No Typology Code available for payment source | Admitting: Family Medicine

## 2018-02-01 ENCOUNTER — Encounter: Payer: Self-pay | Admitting: Family Medicine

## 2018-02-01 ENCOUNTER — Ambulatory Visit (INDEPENDENT_AMBULATORY_CARE_PROVIDER_SITE_OTHER): Payer: No Typology Code available for payment source | Admitting: Family Medicine

## 2018-02-01 VITALS — BP 143/90 | HR 85 | Ht 73.0 in | Wt 271.0 lb

## 2018-02-01 DIAGNOSIS — Z114 Encounter for screening for human immunodeficiency virus [HIV]: Secondary | ICD-10-CM

## 2018-02-01 DIAGNOSIS — Z0000 Encounter for general adult medical examination without abnormal findings: Secondary | ICD-10-CM

## 2018-02-01 DIAGNOSIS — E782 Mixed hyperlipidemia: Secondary | ICD-10-CM

## 2018-02-01 DIAGNOSIS — F325 Major depressive disorder, single episode, in full remission: Secondary | ICD-10-CM | POA: Diagnosis not present

## 2018-02-01 DIAGNOSIS — I10 Essential (primary) hypertension: Secondary | ICD-10-CM | POA: Diagnosis not present

## 2018-02-01 DIAGNOSIS — Z6836 Body mass index (BMI) 36.0-36.9, adult: Secondary | ICD-10-CM | POA: Diagnosis not present

## 2018-02-01 DIAGNOSIS — F419 Anxiety disorder, unspecified: Secondary | ICD-10-CM | POA: Diagnosis not present

## 2018-02-01 DIAGNOSIS — R6882 Decreased libido: Secondary | ICD-10-CM | POA: Insufficient documentation

## 2018-02-01 HISTORY — DX: Major depressive disorder, single episode, in full remission: F32.5

## 2018-02-01 NOTE — Patient Instructions (Signed)
Thank you for coming in today STOP amlodipine.  Get labs now I will likely increase the blood pressure medicine.  We will likely recheck blood pressure in a few months.  Follow up we will determine after labs.   Work on meal prep a good quality meals.

## 2018-02-01 NOTE — Progress Notes (Signed)
Albert Hogan is a 47 y.o. male who presents to [place]: [place] today for well adult visit.  Albert Hogan is doing quite well.  He notes some mild leg swelling over the last few months.  He is not sure how the leg swelling started with starting his amlodipine.  He notes is not particular bothersome and is symmetrical.  He denies chest pain shortness of breath or trouble breathing.  He notes that he continues to struggle to find a good work life balance to emphasize his health.  He notes he has long working days at times and has trouble making good food choices and getting exercise opportunities.  He is thought about doing some meal prepping and has done some in the past.  Additionally he is working to improve his work life balance to allow him to take better care of himself overall.  He notes that his anxiety and depression symptoms are quite reasonably well controlled today with Zoloft.  He is happy with how things are going  He notes that he takes amlodipine and Benicar HCT daily for blood pressure.  Additionally Albert Hogan notes some mild low libido and fatigue.  He is interested in checking to see if his testosterone is low.   ROS as above:  Exam:  BP (!) 143/90   Pulse 85   Ht 6\' 1"  (1.854 m)   Wt 271 lb (122.9 kg)   BMI 35.75 kg/m   Wt Readings from Last 5 Encounters:  02/01/18 271 lb (122.9 kg)  11/16/17 264 lb (119.7 kg)  10/02/17 260 lb (117.9 kg)  09/21/17 261 lb (118.4 kg)  07/05/17 285 lb (129.3 kg)    Gen: Well NAD HEENT: EOMI,  MMM Lungs: Normal work of breathing. CTABL Heart: RRR no MRG Abd: NABS, Soft. Nondistended, Nontender Exts: Brisk capillary refill, warm and well perfused.  Trace edema bilateral lower extremities  Depression screen [facility] 2/9 02/01/2018 04/21/2017 04/07/2017 01/26/2017 01/26/2017  Decreased Interest 0 1 2 0 0  Down, Depressed,  Hopeless 0 0 1 0 0  PHQ - 2 Score 0 1 3 0 0  Altered sleeping 1 1 2 3  -  Tired, decreased energy 1 1 3 3  -  Change in appetite 1 1 3 1  -  Feeling bad or failure about yourself  1 0 2 1 -  Trouble concentrating 0 0 2 1 -  Moving slowly or fidgety/restless 0 0 1 0 -  Suicidal thoughts 0 0 0 0 -  PHQ-9 Score 4 4 16 9  -  Difficult doing work/chores Not difficult at all - Somewhat difficult - -   GAD 7 : Generalized Anxiety Score 02/01/2018 01/26/2017 07/06/2016  Nervous, Anxious, on Edge 1 2 0  Control/stop worrying 0 2 0  Worry too much - different things 0 2 0  Trouble relaxing 1 3 0  Restless 0 2 0  Easily annoyed or irritable 1 3 0  Afraid - awful might happen 0 2 0  Total GAD 7 Score 3 16 0  Anxiety Difficulty Not difficult at all Very difficult Not difficult at all      Lab and Radiology Results   Chemistry      Component Value Date/Time   NA 141 02/01/2017 0815   K 4.5 02/01/2017 0815   CL 105 02/01/2017 0815   CO2 25 02/01/2017 0815   BUN 26 (H) 02/01/2017 0815   CREATININE 1.53 (H) 02/01/2017 0815  GLU 85 08/28/2015      Component Value Date/Time   CALCIUM 9.1 02/01/2017 0815   CALCIUM 9.5 08/28/2015   ALKPHOS 67 02/01/2017 0815   AST 22 02/01/2017 0815   ALT 20 02/01/2017 0815   BILITOT 0.3 02/01/2017 0815       Assessment and Plan: 47 y.o. male with  Well adult.  Doing reasonably well.  Discussed importance of nutrition and exercise.  Additionally discussed some self-care opportunities including getting adequate sleep and proper nutrition.  Did some problem-solving regarding eating opportunities including discussing meal prepping and meal planning.  He agrees to enact some of these behavioral changes.  His blood pressure is a bit elevated and he has some peripheral edema I think due to amlodipine.  Labs were checked about a year ago and his creatinine was elevated a bit at that time.  Plan to stop the amlodipine and recheck his metabolic panel.  If kidney  function is reasonably well controlled we will increase the Benicar HCT to 80 mg of olmesartan and 25 mg of hydrochlorothiazide.  Likely will recheck blood pressure with a nurse visit in about a month after making this change.  Low libido.  Will check testosterone value as well.  We will check basic fasting labs to address obesity as well including Q2W and metabolic panel and lipid panel.   Orders Placed This Encounter  Procedures  . CBC  . COMPLETE METABOLIC PANEL WITH GFR  . Lipid Panel w/reflex Direct LDL  . Testosterone  . Hemoglobin A1c  . HIV antibody   No orders of the defined types were placed in this encounter.    Historical information moved to improve visibility of documentation.  Past Medical History:  Diagnosis Date  . Anxiety 01/26/2017  . 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 ED (erectile dysfunction)   . Hypertension   . Morbid obesity ([place]) 04/21/2017   Past Surgical History:  Procedure Laterality Date  . APPENDECTOMY  2001  . HERNIA REPAIR    . KNEE SURGERY Right 11/27/2015   Partial medial and lateral meniscetomy and microfracture lateral femoral condyle. Dr Minda Meo [facility] [place]   Social History   Tobacco Use  . Smoking status: Never Smoker  . Smokeless tobacco: Never Used  Substance Use Topics  . Alcohol use: Not on file   family history includes Hypertension in his father and mother; Thyroid disease in his mother.  Medications: Current Outpatient Medications  Medication Sig Dispense Refill  . cyclobenzaprine (FLEXERIL) 10 MG tablet Take 1 tablet (10 mg total) by mouth 3 (three) times daily as needed for muscle spasms. 30 tablet 5  . diclofenac sodium (VOLTAREN) 1 % GEL Apply 4 g topically 4 (four) times daily. To affected joint. 100 g 11  . EPINEPHrine (EPIPEN 2-PAK) 0.3 mg/0.3 mL IJ SOAJ injection Inject 0.3 mLs (0.3 mg total) into the muscle once. 2 Device 0  . gabapentin (NEURONTIN) 300  MG capsule One tab PO qHS for a week, then BID for a week, then TID. May double weekly to a max of 3,600mg /day 180 capsule 3  . olmesartan-hydrochlorothiazide (BENICAR HCT) 40-12.5 MG tablet Take 1 tablet by mouth daily. 90 tablet 1  . sertraline (ZOLOFT) 100 MG tablet Take 1 tablet (100 mg total) by mouth daily. 90 tablet 3  . tadalafil (CIALIS) 20 MG tablet TAKE ONE TABLET BY MOUTH ONCE DAILY AS NEEDED. 10 tablet 11   No current facility-administered medications for this visit.  Allergies  Allergen Reactions  . Shellfish Allergy     Health Maintenance Health Maintenance  Topic Date Due  . INFLUENZA VACCINE  04/07/2018 (Originally 03/15/2018)  . TETANUS/TDAP  08/15/2021  . HIV Screening  Completed    Discussed warning signs or symptoms. Please see discharge instructions. Patient expresses understanding.

## 2018-02-02 ENCOUNTER — Encounter: Payer: Self-pay | Admitting: Family Medicine

## 2018-02-02 LAB — CBC
HEMATOCRIT: 40.3 % (ref 38.5–50.0)
Hemoglobin: 13.7 g/dL (ref 13.2–17.1)
MCH: 29.7 pg (ref 27.0–33.0)
MCHC: 34 g/dL (ref 32.0–36.0)
MCV: 87.4 fL (ref 80.0–100.0)
MPV: 12.2 fL (ref 7.5–12.5)
Platelets: 224 10*3/uL (ref 140–400)
RBC: 4.61 10*6/uL (ref 4.20–5.80)
RDW: 13.7 % (ref 11.0–15.0)
WBC: 6.4 10*3/uL (ref 3.8–10.8)

## 2018-02-02 LAB — COMPLETE METABOLIC PANEL WITH GFR
AG RATIO: 1.5 (calc) (ref 1.0–2.5)
ALT: 18 U/L (ref 9–46)
AST: 25 U/L (ref 10–40)
Albumin: 4.2 g/dL (ref 3.6–5.1)
Alkaline phosphatase (APISO): 61 U/L (ref 40–115)
BUN/Creatinine Ratio: 20 (calc) (ref 6–22)
BUN: 27 mg/dL — ABNORMAL HIGH (ref 7–25)
CO2: 29 mmol/L (ref 20–32)
Calcium: 9.4 mg/dL (ref 8.6–10.3)
Chloride: 103 mmol/L (ref 98–110)
Creat: 1.35 mg/dL (ref 0.60–1.35)
GFR, EST NON AFRICAN AMERICAN: 63 mL/min/{1.73_m2} (ref >=60)
GFR, Est African American: 72 mL/min/{1.73_m2} (ref >=60)
GLOBULIN: 2.8 g/dL (ref 1.9–3.7)
Glucose, Bld: 95 mg/dL (ref 65–99)
POTASSIUM: 4.6 mmol/L (ref 3.5–5.3)
SODIUM: 140 mmol/L (ref 135–146)
Total Bilirubin: 0.4 mg/dL (ref 0.2–1.2)
Total Protein: 7 g/dL (ref 6.1–8.1)

## 2018-02-02 LAB — HIV ANTIBODY (ROUTINE TESTING W REFLEX): HIV: NONREACTIVE

## 2018-02-02 LAB — LIPID PANEL W/REFLEX DIRECT LDL
CHOLESTEROL: 186 mg/dL (ref <200)
HDL: 43 mg/dL (ref >40)
LDL Cholesterol (Calc): 126 mg/dL (calc) — ABNORMAL HIGH
NON-HDL CHOLESTEROL (CALC): 143 mg/dL — AB (ref <130)
Total CHOL/HDL Ratio: 4.3 (calc) (ref <5.0)
Triglycerides: 74 mg/dL (ref <150)

## 2018-02-02 LAB — TESTOSTERONE: Testosterone: 332 ng/dL (ref 250–827)

## 2018-02-02 LAB — HEMOGLOBIN A1C
EAG (MMOL/L): 6.6 (calc)
HEMOGLOBIN A1C: 5.8 %{Hb} — AB (ref <5.7)
MEAN PLASMA GLUCOSE: 120 (calc)

## 2018-02-02 MED ORDER — ATORVASTATIN CALCIUM 20 MG PO TABS: 20.0000 mg | ORAL_TABLET | Freq: Every day | ORAL | 3 refills | Status: DC

## 2018-02-02 MED ORDER — OLMESARTAN MEDOXOMIL-HCTZ 40-25 MG PO TABS: 1.0000 | ORAL_TABLET | Freq: Every day | ORAL | 1 refills | Status: DC

## 2018-02-02 NOTE — Addendum Note (Signed)
Addended by: Gregor Hams on: 02/02/2018 07:31 AM   Modules accepted: Orders

## 2018-03-14 ENCOUNTER — Other Ambulatory Visit: Payer: Self-pay | Admitting: Family Medicine

## 2018-04-10 ENCOUNTER — Encounter: Payer: Self-pay | Admitting: Family Medicine

## 2018-04-12 ENCOUNTER — Ambulatory Visit (INDEPENDENT_AMBULATORY_CARE_PROVIDER_SITE_OTHER): Payer: No Typology Code available for payment source

## 2018-04-12 ENCOUNTER — Ambulatory Visit: Payer: No Typology Code available for payment source | Admitting: Family Medicine

## 2018-04-12 ENCOUNTER — Encounter: Payer: Self-pay | Admitting: Family Medicine

## 2018-04-12 VITALS — BP 118/73 | HR 88 | Ht 73.0 in | Wt 272.0 lb

## 2018-04-12 DIAGNOSIS — M958 Other specified acquired deformities of musculoskeletal system: Secondary | ICD-10-CM

## 2018-04-12 DIAGNOSIS — M25561 Pain in right knee: Secondary | ICD-10-CM

## 2018-04-12 DIAGNOSIS — E782 Mixed hyperlipidemia: Secondary | ICD-10-CM | POA: Diagnosis not present

## 2018-04-12 DIAGNOSIS — M1731 Unilateral post-traumatic osteoarthritis, right knee: Secondary | ICD-10-CM

## 2018-04-12 DIAGNOSIS — G8929 Other chronic pain: Secondary | ICD-10-CM | POA: Diagnosis not present

## 2018-04-12 DIAGNOSIS — I10 Essential (primary) hypertension: Secondary | ICD-10-CM

## 2018-04-12 NOTE — Progress Notes (Signed)
Albert Hogan is a 47 y.o. male who presents to [place]: [place] today for right knee pain, HTN, HLD, Obesity.   Albert Hogan has a history of right knee pain ongoing for over 2-1/2 years.  In 2017 he had a knee MRI which showed meniscus injury and chondromalacia.  He had arthroscopic surgery performed in outside hospital where he had his lateral meniscus tear debrided had a microfracture of the lateral femoral condyle and had some chondroplasty at the medial femoral condyle.  Since then he has had pain off and on associate with swelling.  Most recently he was seen for this in April where he had a painful plica snapping overlying his anterior medial femoral condyle.  This was injected and he had a intra-articular steroid injection.  In the interim he notes he continues to have significant right knee pain and discomfort. His pain limits his activity significantly.  Lipids:  Patient was started on atorvastatin about 3 months ago during a wellness visit.  He tolerates it well without significant muscle aches or pains.  Hypertension: About 3 months ago his hydrochlorothiazide component was increased to 25 mg daily.  He tolerates it well.  Obesity: Albert Hogan  finds it difficult to lose weight.  He is trying to eat a healthier diet.  ROS as above:  Exam:  BP 118/73   Pulse 88   Ht 6\' 1"  (1.854 m)   Wt 272 lb (123.4 kg)   BMI 35.89 kg/m  Wt Readings from Last 5 Encounters:  04/12/18 272 lb (123.4 kg)  02/01/18 271 lb (122.9 kg)  11/16/17 264 lb (119.7 kg)  10/02/17 260 lb (117.9 kg)  09/21/17 261 lb (118.4 kg)    Gen: Well NAD HEENT: EOMI,  MMM Lungs: Normal work of breathing. CTABL Heart: RRR no MRG Abd: NABS, Soft. Nondistended, Nontender Exts: Brisk capillary refill, warm and well perfused.  Right knee: Mild effusion.   Range of motion 0-120 degrees. Tender palpation medial  joint line. Palpable snapping plica not tender. Stable ligamentous exam. Intact flexion and extension strength.  Lab and Radiology Results X-ray images personally independently reviewed. 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 No acute fractures or other significant bony changes present. Await formal radiology review   Assessment and Plan: 47 y.o. 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  Significant DJD change on today's x-ray compared to previous MRI in 2017.  Patient is effectively failing conservative management.  Plan to proceed with a trial of Visco supplementation.  If Albert Hogan does not get benefit from trial of hyaluronic acid injections or it is too expensive I think the next option is a knee replacement.  Lipids: Tolerating atorvastatin well.  Check basic fasting labs listed below in the near future.  Pretension: Doing well continue current regimen.  Obesity: Discussed weight loss strategies.   Orders Placed This Encounter  Procedures  . DG Knee Complete 4 Views Right    Please include patellar sunrise, lateral, and weightbearing bilateral AP and bilateral rosenberg views    Standing Status:   Future    Number of Occurrences:   1    Standing Expiration Date:   06/12/2019    Order Specific Question:   Reason for exam:    Answer:   Please include patellar sunrise, lateral, and weightbearing bilateral AP and bilateral rosenberg views    Comments:   Please include  patellar sunrise, lateral, and weightbearing bilateral AP and bilateral rosenberg views    Order Specific Question:   Preferred imaging location?    Answer:   [facility]  . DG Knee 1-2 Views Left    Standing Status:   Future    Number of Occurrences:   1    Standing Expiration Date:   06/13/2019    Order Specific Question:   Reason for Exam (SYMPTOM  OR DIAGNOSIS REQUIRED)    Answer:   For use with right knee  x-ray, bilateral AP and Rosenberg standing.    Order Specific Question:   Preferred imaging location?    Answer:   [facility]  . COMPLETE METABOLIC PANEL WITH GFR  . Lipid Panel w/reflex Direct LDL   No orders of the defined types were placed in this encounter.    Historical information moved to improve visibility of documentation.  Past Medical History:  Diagnosis Date  . Anxiety 01/26/2017  . 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 ED (erectile dysfunction)   . Hypertension   . Morbid obesity ([place]) 04/21/2017   Past Surgical History:  Procedure Laterality Date  . APPENDECTOMY  2001  . HERNIA REPAIR    . KNEE SURGERY Right 11/27/2015   Partial medial and lateral meniscetomy and microfracture lateral femoral condyle. Dr Minda Meo [facility] [place]   Social History   Tobacco Use  . Smoking status: Never Smoker  . Smokeless tobacco: Never Used  Substance Use Topics  . Alcohol use: Not on file   family history includes Hypertension in his father and mother; Thyroid disease in his mother.  Medications: Current Outpatient Medications  Medication Sig Dispense Refill  . atorvastatin (LIPITOR) 20 MG tablet Take 1 tablet (20 mg total) by mouth daily. 90 tablet 3  . EPINEPHrine (EPIPEN 2-PAK) 0.3 mg/0.3 mL IJ SOAJ injection Inject 0.3 mLs (0.3 mg total) into the muscle once. 2 Device 0  . olmesartan-hydrochlorothiazide (BENICAR HCT) 40-25 MG tablet Take 1 tablet by mouth daily. 90 tablet 1  . sertraline (ZOLOFT) 100 MG tablet Take 1 tablet (100 mg total) by mouth daily. 90 tablet 3  . tadalafil (ADCIRCA/CIALIS) 20 MG tablet TAKE 1 TABLET BY MOUTH ONCE DAILY AS NEEDED 6 tablet 19   No current facility-administered medications for this visit.    Allergies  Allergen Reactions  . Shellfish Allergy Anaphylaxis     Discussed warning signs or symptoms. Please see discharge instructions. Patient expresses  understanding.

## 2018-04-12 NOTE — Patient Instructions (Addendum)
Thank you for coming in today.   You should hear about the gel shots.   Get fasting labs in the near future.   We may consider knee replacement sooner.  Partial knee may be an option.   Sodium Hyaluronate intra-articular injection What is this medicine? SODIUM HYALURONATE (SOE dee um hye al yoor ON ate) is used to treat pain in the knee due to osteoarthritis. This medicine may be used for other purposes; ask your health care provider or pharmacist if you have questions. COMMON BRAND NAME(S): Amvisc, DUROLANE, Euflexxa, GELSYN-3, Hyalgan, Monovisc, Orthovisc, Supartz, Supartz FX What should I tell my health care provider before I take this medicine? They need to know if you have any of these conditions: -bleeding disorders -glaucoma -infection in the knee joint -skin conditions or sensitivity -skin infection -an unusual allergic reaction to sodium hyaluronate, other medicines, foods, dyes, or preservatives. Different brands of sodium hyaluronate contain different allergens. Some may contain egg. Talk to your doctor about your allergies to make sure that you get the right product. -pregnant or trying to get pregnant -breast-feeding How should I use this medicine? This medicine is for injection into the knee joint. It is given by a health care professional in a hospital or clinic setting. Talk to your pediatrician regarding the use of this medicine in children. Special care may be needed. Overdosage: If you think you have taken too much of this medicine contact a poison control center or emergency room at once. NOTE: This medicine is only for you. Do not share this medicine with others. What if I miss a dose? This does not apply. What may interact with this medicine? Interactions are not expected. This list may not describe all possible interactions. Give your health care provider a list of all the medicines, herbs, non-prescription drugs, or dietary supplements you use. Also tell them  if you smoke, drink alcohol, or use illegal drugs. Some items may interact with your medicine. What should I watch for while using this medicine? Tell your doctor or healthcare professional if your symptoms do not start to get better or if they get worse. If receiving this medicine for osteoarthritis, limit your activity after you receive your injection. Avoid physical activity for 48 hours following your injection to keep your knee from swelling. Do not stand on your feet for more than 1 hour at a time during the first 48 hours following your injection. Ask your doctor or healthcare professional about when you can begin major physical activity again. What side effects may I notice from receiving this medicine? Side effects that you should report to your doctor or health care professional as soon as possible: -allergic reactions like skin rash, itching or hives, swelling of the face, lips, or tongue -dizziness -facial flushing -pain, tingling, numbness in the hands or feet -vision changes if received this medicine during eye surgery Side effects that usually do not require medical attention (report to your doctor or health care professional if they continue or are bothersome): -back pain -bruising at site where injected -chills -diarrhea -fever -headache -joint pain -joint stiffness -joint swelling -muscle cramps -muscle pain -nausea, vomiting -pain, redness, or irritation at site where injected -weak or tired This list may not describe all possible side effects. Call your doctor for medical advice about side effects. You may report side effects to FDA at 1-800-FDA-1088. Where should I keep my medicine? This drug is given in a hospital or clinic and will not be stored at home.  NOTE: This sheet is a summary. It may not cover all possible information. If you have questions about this medicine, talk to your doctor, pharmacist, or health care provider.  2018 Elsevier/Gold Standard  (2015-09-03 08:34:51)

## 2018-04-13 DIAGNOSIS — M170 Bilateral primary osteoarthritis of knee: Secondary | ICD-10-CM | POA: Insufficient documentation

## 2018-04-13 DIAGNOSIS — M1710 Unilateral primary osteoarthritis, unspecified knee: Secondary | ICD-10-CM | POA: Insufficient documentation

## 2018-04-19 NOTE — Telephone Encounter (Signed)
We have In and Out of Network benefits. IN-Orthovisc is covered. There is no copay. The deductible has been met and the patient's responsibility is 20% of the allowable amount for the product, administration, and office visit. Once the out of pocket is met, the patient will have no financial responsibility. Call reference number is [id number]. OON-Orthovisc is covered. There is no copay. After the deductible has been met, the patient's responsibility will be 40% of the allowable amount for the product, administration, and office visit. Once the out of pocket is met, the patient will have no financial responsibility. We are triaging the prescription to [facility]. Call reference number is [id number].   Patient is agreeable to estimate of the injection. Appointment has been scheduled.

## 2018-04-20 ENCOUNTER — Encounter: Payer: Self-pay | Admitting: Family Medicine

## 2018-04-20 ENCOUNTER — Ambulatory Visit (INDEPENDENT_AMBULATORY_CARE_PROVIDER_SITE_OTHER): Payer: No Typology Code available for payment source | Admitting: Family Medicine

## 2018-04-20 VITALS — BP 136/82 | HR 93 | Temp 98.4°F | Wt 269.7 lb

## 2018-04-20 DIAGNOSIS — M1731 Unilateral post-traumatic osteoarthritis, right knee: Secondary | ICD-10-CM | POA: Diagnosis not present

## 2018-04-20 DIAGNOSIS — M958 Other specified acquired deformities of musculoskeletal system: Secondary | ICD-10-CM

## 2018-04-20 DIAGNOSIS — M25461 Effusion, right knee: Secondary | ICD-10-CM

## 2018-04-20 LAB — COMPLETE METABOLIC PANEL WITH GFR
AG RATIO: 1.4 (calc) (ref 1.0–2.5)
ALT: 22 U/L (ref 9–46)
AST: 23 U/L (ref 10–40)
Albumin: 4.2 g/dL (ref 3.6–5.1)
Alkaline phosphatase (APISO): 65 U/L (ref 40–115)
BUN/Creatinine Ratio: 18 (calc) (ref 6–22)
BUN: 29 mg/dL — AB (ref 7–25)
CALCIUM: 9.5 mg/dL (ref 8.6–10.3)
CO2: 28 mmol/L (ref 20–32)
Chloride: 102 mmol/L (ref 98–110)
Creat: 1.59 mg/dL — ABNORMAL HIGH (ref 0.60–1.35)
GFR, EST AFRICAN AMERICAN: 59 mL/min/{1.73_m2} — AB (ref >=60)
GFR, EST NON AFRICAN AMERICAN: 51 mL/min/{1.73_m2} — AB (ref >=60)
GLUCOSE: 102 mg/dL — AB (ref 65–99)
Globulin: 3.1 g/dL (calc) (ref 1.9–3.7)
POTASSIUM: 3.9 mmol/L (ref 3.5–5.3)
Sodium: 139 mmol/L (ref 135–146)
TOTAL PROTEIN: 7.3 g/dL (ref 6.1–8.1)
Total Bilirubin: 0.4 mg/dL (ref 0.2–1.2)

## 2018-04-20 LAB — LIPID PANEL W/REFLEX DIRECT LDL
CHOLESTEROL: 132 mg/dL (ref <200)
HDL: 44 mg/dL (ref >40)
LDL Cholesterol (Calc): 68 mg/dL (calc)
Non-HDL Cholesterol (Calc): 88 mg/dL (calc) (ref <130)
Total CHOL/HDL Ratio: 3 (calc) (ref <5.0)
Triglycerides: 112 mg/dL (ref <150)

## 2018-04-20 NOTE — Progress Notes (Signed)
Patient presents to clinic for previously arranged monovisc injection to the right knee.   Procedure: Real-time Ultrasound Guided Injection of right knee  Device: GE Logiq E   Images permanently stored and available for review in the ultrasound unit. Verbal informed consent obtained.  Discussed risks and benefits of procedure. Warned about infection bleeding damage to structures skin hypopigmentation and fat atrophy among others. Patient expresses understanding and agreement Time-out conducted.   Noted no overlying erythema, induration, or other signs of local infection.   Skin prepped in a sterile fashion.   Local anesthesia: Topical Ethyl chloride.   With sterile technique and under real time ultrasound guidance:  monovisc injected easily.   Completed without difficulty    Advised to call if fevers/chills, erythema, induration, drainage, or persistent bleeding.   Images permanently stored and available for review in the ultrasound unit.  Impression: Technically successful ultrasound guided injection.  Lot Number [id number] exp 7/21

## 2018-04-20 NOTE — Patient Instructions (Signed)
Thank you for coming in today.   Call or go to the ER if you develop a large red swollen joint with extreme pain or oozing puss.   

## 2018-06-22 ENCOUNTER — Ambulatory Visit (INDEPENDENT_AMBULATORY_CARE_PROVIDER_SITE_OTHER): Payer: No Typology Code available for payment source | Admitting: Family Medicine

## 2018-06-22 ENCOUNTER — Encounter: Payer: Self-pay | Admitting: Family Medicine

## 2018-06-22 VITALS — BP 118/77 | HR 105 | Ht 73.0 in | Wt 270.0 lb

## 2018-06-22 DIAGNOSIS — M958 Other specified acquired deformities of musculoskeletal system: Secondary | ICD-10-CM | POA: Diagnosis not present

## 2018-06-22 DIAGNOSIS — M25461 Effusion, right knee: Secondary | ICD-10-CM | POA: Diagnosis not present

## 2018-06-22 DIAGNOSIS — M25561 Pain in right knee: Secondary | ICD-10-CM | POA: Diagnosis not present

## 2018-06-22 DIAGNOSIS — M1731 Unilateral post-traumatic osteoarthritis, right knee: Secondary | ICD-10-CM

## 2018-06-22 NOTE — Patient Instructions (Addendum)
Thank you for coming in today.  Call or go to the ER if you develop a large red swollen joint with extreme pain or oozing puss.   Let me know if you want to consider very occasional tramadol.   Use tylenol mostly for pain.   Avoid ibuprofen or aleve unless occasional use.   Continue topical treatment.   Let me know how the knee is feeling.

## 2018-06-22 NOTE — Progress Notes (Signed)
Albert Hogan is a 47 y.o. male who presents to [facility] today for right knee pain.  Albert Hogan notes continued pain and swelling in the right knee.  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  He had meniscus debridement and microfracture a few years ago and has had continued pain off and on over the last several years.   He experiences knee pain and swelling but denies significant locking or catching.  The pain does limit his activity.  He last had a steroid injection in his right knee April.  The injection provided pain relief for 2 to 3 months.  He had a trial of Monovisc hyaluronic acid injection in September which did not help at all.  He notes continued knee pain and swelling and is interested in repeat injection.  He had a consultation with orthopedic surgery for total knee replacement.  Although he he is a good candidate he was advised to try to wait as long as possible.   ROS as above:  Exam:  BP 118/77   Pulse (!) 105   Ht 6' 1"  (1.854 m)   Wt 270 lb (122.5 kg)   BMI 35.62 kg/m  Wt Readings from Last 5 Encounters:  06/22/18 270 lb (122.5 kg)  04/20/18 269 lb 11.2 oz (122.3 kg)  04/12/18 272 lb (123.4 kg)  02/01/18 271 lb (122.9 kg)  11/16/17 264 lb (119.7 kg)    Gen: Well NAD HEENT: EOMI,  MMM Lungs: Normal work of breathing. CTABL Heart: RRR no MRG Abd: NABS, Soft. Nondistended, Nontender Exts: Brisk capillary refill, warm and well perfused.  Right knee: Moderate effusion otherwise normal-appearing.  Normal range of motion with mild crepitations.  Stable ligamentous exam.  Lab and Radiology Results Procedure: Real-time Ultrasound Guided Injection of right knee  Device: GE Logiq E   Images permanently stored and available for review in the ultrasound unit. Verbal informed consent obtained.   Discussed risks and benefits of procedure. Warned about infection bleeding damage to structures skin hypopigmentation and fat atrophy among others. Patient expresses understanding and agreement Time-out conducted.   Noted no overlying erythema, induration, or other signs of local infection.   Skin prepped in a sterile fashion.   Local anesthesia: Topical Ethyl chloride.   With sterile technique and under real time ultrasound guidance:  30m kenalog and 451mmarcaine injected easily.   Completed without difficulty   Pain immediately resolved suggesting accurate placement of the medication.   Advised to call if fevers/chills, erythema, induration, drainage, or persistent bleeding.   Images permanently stored and available for review in the ultrasound unit.  Impression: Technically successful ultrasound guided injection.     Assessment and Plan: [age].o. male with right knee pain and swelling due to degenerative changes.  Plan for repeat injection.  Steroid injections are not a great long-term plan is eventually they will stop working.  Discussed alternatives.  Patient has already tried and failed hyaluronic acid injections.  I do not see the utility in repeating this again in the future.  Additionally he is not a good candidate for continued regular NSAID use due to his hypertension mild chronic kidney disease.  Additionally would like to avoid opiate use if possible.  May consider intermittent tramadol use in the future however would like to hold off if possible.  Additionally we discussed stem cell therapy and the fact that it is very much not evidence  space at this time.  The evidence that we do have is poor quality and typically based on animal studies.  Additionally mesenchymal stem cell injections are very expensive and unlikely to provide significant benefit when compared with total knee replacement.  We also discussed platelet rich plasma.  Although there is not much evidence for platelet  rich plasma for knee pain due to arthritis the cost is certainly lower.  We may consider platelet rich plasma injections in the future if need any steroid injections no longer provide benefit.  Handout provided for platelet rich plasma PEAK kit.   Recommend topical diclofenac and weight loss as well as quad strengthening which may help.  I spent 25 minutes with this patient, greater than 50% was face-to-face time counseling regarding as above ddx and plan and options.     Historical information moved to improve visibility of documentation.  Past Medical History:  Diagnosis Date  . Anxiety 01/26/2017  . 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 ED (erectile dysfunction)   . Hypertension   . Morbid obesity ([place]) 04/21/2017   Past Surgical History:  Procedure Laterality Date  . APPENDECTOMY  2001  . HERNIA REPAIR    . KNEE SURGERY Right 11/27/2015   Partial medial and lateral meniscetomy and microfracture lateral femoral condyle. Dr Minda Meo [facility] [place]   Social History   Tobacco Use  . Smoking status: Never Smoker  . Smokeless tobacco: Never Used  Substance Use Topics  . Alcohol use: Not on file   family history includes Hypertension in his father and mother; Thyroid disease in his mother.  Medications: Current Outpatient Medications  Medication Sig Dispense Refill  . atorvastatin (LIPITOR) 20 MG tablet Take 1 tablet (20 mg total) by mouth daily. 90 tablet 3  . EPINEPHrine (EPIPEN 2-PAK) 0.3 mg/0.3 mL IJ SOAJ injection Inject 0.3 mLs (0.3 mg total) into the muscle once. 2 Device 0  . olmesartan-hydrochlorothiazide (BENICAR HCT) 40-25 MG tablet Take 1 tablet by mouth daily. 90 tablet 1  . sertraline (ZOLOFT) 100 MG tablet Take 1 tablet (100 mg total) by mouth daily. 90 tablet 3  . tadalafil (ADCIRCA/CIALIS) 20 MG tablet TAKE 1 TABLET BY MOUTH ONCE DAILY AS NEEDED 6 tablet 19   No current facility-administered medications  for this visit.    Allergies  Allergen Reactions  . Shellfish Allergy Anaphylaxis     Discussed warning signs or symptoms. Please see discharge instructions. Patient expresses understanding.

## 2018-07-17 ENCOUNTER — Telehealth: Payer: Self-pay | Admitting: Family Medicine

## 2018-07-17 NOTE — Telephone Encounter (Signed)
Called patient back about cost of service.  [facility] will be corrected after discussion with [employer].

## 2018-07-17 NOTE — Telephone Encounter (Signed)
Patient received a $1900 bill for the Monovisc injection performed in September.  He is shocked because this medicine was prior authorized and should not of been this expensive.  I advised him to discuss with his insurance company to make sure they do not need any more information.  On our end I will discuss with the office manager. I had a phone conversation about this today.

## 2018-08-21 ENCOUNTER — Other Ambulatory Visit: Payer: Self-pay | Admitting: Family Medicine

## 2018-08-27 ENCOUNTER — Encounter: Payer: Self-pay | Admitting: Family Medicine

## 2018-08-28 ENCOUNTER — Encounter: Payer: Self-pay | Admitting: Family Medicine

## 2018-10-30 ENCOUNTER — Ambulatory Visit (INDEPENDENT_AMBULATORY_CARE_PROVIDER_SITE_OTHER): Payer: No Typology Code available for payment source | Admitting: Family Medicine

## 2018-10-30 ENCOUNTER — Other Ambulatory Visit: Payer: Self-pay

## 2018-10-30 ENCOUNTER — Encounter: Payer: Self-pay | Admitting: Family Medicine

## 2018-10-30 VITALS — BP 124/75 | HR 86 | Temp 98.2°F | Wt 278.0 lb

## 2018-10-30 DIAGNOSIS — M25562 Pain in left knee: Secondary | ICD-10-CM

## 2018-10-30 NOTE — Patient Instructions (Signed)
Thank you for coming in today. Let me know how it goes.  Call or go to the ER if you develop a large red swollen joint with extreme pain or oozing puss.   Recheck as needed.

## 2018-10-30 NOTE — Progress Notes (Signed)
Albert Hogan is a 48 y.o. male who presents to [facility] today for left knee pain.  Albert Hogan was in his normal state of health about a week and a half ago.  He extended his left knee and felt a popping sensation.  Since then he has had pain popping occasional locking stiffness and swelling in his left knee.  He has been treated with ice which is helped some.  Pain is moderate to severe at times.  Pain is worse with activity and better with rest.  He notes his pain is similar to when he had a meniscus tear in his right knee.  He denies significant impact or significant injury.    ROS:  As above  Exam:  BP 124/75   Pulse 86   Temp 98.2 F (36.8 C) (Oral)   Wt 278 lb (126.1 kg)   BMI 36.68 kg/m  Wt Readings from Last 5 Encounters:  10/30/18 278 lb (126.1 kg)  06/22/18 270 lb (122.5 kg)  04/20/18 269 lb 11.2 oz (122.3 kg)  04/12/18 272 lb (123.4 kg)  02/01/18 271 lb (122.9 kg)   General: Well Developed, well nourished, and in no acute distress.  Neuro/Psych: Alert and oriented x3, extra-ocular muscles intact, able to move all 4 extremities, sensation grossly intact. Skin: Warm and dry, no rashes noted.  Respiratory: Not using accessory muscles, speaking in full sentences, trachea midline.  Cardiovascular: Pulses palpable, no extremity edema. Abdomen: Does not appear distended. MSK:  Left knee: Mild effusion otherwise normal-appearing Range of motion 0-120 degrees with crepitations. Tender to palpation medial joint line. Stable ligamentous exam. Intact strength. Positive medial McMurray's test.  Right knee: Mild effusion otherwise normal-appearing Range of motion 0-120 degrees. Not particularly tender. Stable ligamentous exam. Intact strength. Negative McMurray test.    Lab and Radiology Results Procedure: Real-time Ultrasound Guided Injection of left knee Device: GE Logiq E   Images permanently stored and available for  review in the ultrasound unit. Verbal informed consent obtained.  Discussed risks and benefits of procedure. Warned about infection bleeding damage to structures skin hypopigmentation and fat atrophy among others. Patient expresses understanding and agreement Time-out conducted.   Noted no overlying erythema, induration, or other signs of local infection.   Skin prepped in a sterile fashion.   Local anesthesia: Topical Ethyl chloride.   With sterile technique and under real time ultrasound guidance:  80 mg of Kenalog and 4 mL of Marcaine injected easily.   Completed without difficulty   Pain immediately resolved suggesting accurate placement of the medication.   Advised to call if fevers/chills, erythema, induration, drainage, or persistent bleeding.   Images permanently stored and available for review in the ultrasound unit.  Impression: Technically successful ultrasound guided injection.         Assessment and Plan: 48 y.o. male with left knee pain without particular injury with mechanical symptoms.  Concern for meniscus tear or moving loose body to in the way position.  Plan to treat with steroid injection and relative rest.  Recheck if not improving.   PDMP not reviewed this encounter. No orders of the defined types were placed in this encounter.  No orders of the defined types were placed in this encounter.   Historical information moved to improve visibility of documentation.  Past Medical History:  Diagnosis Date  . Anxiety 01/26/2017  . 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 ED (erectile dysfunction)   . Hypertension   .  Morbid obesity ([place]) 04/21/2017   Past Surgical History:  Procedure Laterality Date  . APPENDECTOMY  2001  . HERNIA REPAIR    . KNEE SURGERY Right 11/27/2015   Partial medial and lateral meniscetomy and microfracture lateral femoral condyle. Dr Minda Meo [facility] [place]   Social History    Tobacco Use  . Smoking status: Never Smoker  . Smokeless tobacco: Never Used  Substance Use Topics  . Alcohol use: Not on file   family history includes Hypertension in his father and mother; Thyroid disease in his mother.  Medications: Current Outpatient Medications  Medication Sig Dispense Refill  . atorvastatin (LIPITOR) 20 MG tablet Take 1 tablet (20 mg total) by mouth daily. 90 tablet 3  . EPINEPHrine (EPIPEN 2-PAK) 0.3 mg/0.3 mL IJ SOAJ injection Inject 0.3 mLs (0.3 mg total) into the muscle once. 2 Device 0  . olmesartan-hydrochlorothiazide (BENICAR HCT) 40-25 MG tablet TAKE 1 TABLET BY MOUTH ONCE DAILY 90 tablet 0  . sertraline (ZOLOFT) 100 MG tablet Take 1 tablet (100 mg total) by mouth daily. 90 tablet 3  . tadalafil (ADCIRCA/CIALIS) 20 MG tablet TAKE 1 TABLET BY MOUTH ONCE DAILY AS NEEDED 6 tablet 19  . latanoprost (XALATAN) 0.005 % ophthalmic solution INSTILL 1 DROP INTO EACH EYE AT BEDTIME     No current facility-administered medications for this visit.    Allergies  Allergen Reactions  . Shellfish Allergy Anaphylaxis      Discussed warning signs or symptoms. Please see discharge instructions. Patient expresses understanding.

## 2018-11-30 ENCOUNTER — Other Ambulatory Visit: Payer: Self-pay | Admitting: Family Medicine

## 2018-11-30 MED ORDER — OLMESARTAN MEDOXOMIL-HCTZ 40-25 MG PO TABS: 1.0000 | ORAL_TABLET | Freq: Every day | ORAL | 0 refills | Status: DC

## 2019-01-10 ENCOUNTER — Ambulatory Visit (INDEPENDENT_AMBULATORY_CARE_PROVIDER_SITE_OTHER): Payer: No Typology Code available for payment source | Admitting: Family Medicine

## 2019-01-10 ENCOUNTER — Encounter: Payer: Self-pay | Admitting: Family Medicine

## 2019-01-10 VITALS — BP 104/68 | HR 88 | Ht 72.0 in | Wt 275.0 lb

## 2019-01-10 DIAGNOSIS — E782 Mixed hyperlipidemia: Secondary | ICD-10-CM | POA: Diagnosis not present

## 2019-01-10 DIAGNOSIS — I10 Essential (primary) hypertension: Secondary | ICD-10-CM

## 2019-01-10 DIAGNOSIS — M25561 Pain in right knee: Secondary | ICD-10-CM | POA: Diagnosis not present

## 2019-01-10 DIAGNOSIS — R7303 Prediabetes: Secondary | ICD-10-CM

## 2019-01-10 NOTE — Patient Instructions (Addendum)
Thank you for coming in today.  Call or go to the ER if you develop a large red swollen joint with extreme pain or oozing puss.   We will get labs prior to the upcoming physical in June.   Keep me updated.

## 2019-01-10 NOTE — Progress Notes (Signed)
Albert Hogan is a 48 y.o. male who presents to [facility] today for right knee pain.  Patient has a history of bilateral knee pain due to DJD.  His right knee started hurting again about a month ago.  He last had a steroid injection in his right knee in November.  His knee pain worsened again over the weekend after he played golf.  He denies any new injury or new locking or catching or giving way.  He notes knee swelling and pain.  He is tried over-the-counter medications for pain which helped a little.   ROS:  As above   Exam:  BP 104/68   Pulse 88   Ht 6' (1.829 m)   Wt 275 lb (124.7 kg)   SpO2 98%   BMI 37.30 kg/m  Wt Readings from Last 5 Encounters:  01/10/19 275 lb (124.7 kg)  10/30/18 278 lb (126.1 kg)  06/22/18 270 lb (122.5 kg)  04/20/18 269 lb 11.2 oz (122.3 kg)  04/12/18 272 lb (123.4 kg)   General: Well Developed, well nourished, and in no acute distress.  Neuro/Psych: Alert and oriented x3, extra-ocular muscles intact, able to move all 4 extremities, sensation grossly intact. Skin: Warm and dry, no rashes noted.  Respiratory: Not using accessory muscles, speaking in full sentences, trachea midline.  Cardiovascular: Pulses palpable, no extremity edema. Abdomen: Does not appear distended. MSK:  Right knee: Moderate effusion otherwise normal-appearing. Normal range of motion. Stable given his exam.    Lab and Radiology Results Procedure: Real-time Ultrasound Guided Injection of right knee Device: GE Logiq E   Images permanently stored and available for review in the ultrasound unit. Verbal informed consent obtained.  Discussed risks and benefits of procedure. Warned about infection bleeding damage to structures skin hypopigmentation and fat atrophy among others. Patient expresses understanding and agreement Time-out conducted.   Noted no overlying erythema, induration, or other signs of local infection.   Skin  prepped in a sterile fashion.   Local anesthesia: Topical Ethyl chloride.   With sterile technique and under real time ultrasound guidance:  80 mg of Depo-Medrol and 4 mL of Marcaine injected easily.   Completed without difficulty   Pain immediately resolved suggesting accurate placement of the medication.   Advised to call if fevers/chills, erythema, induration, drainage, or persistent bleeding.   Images permanently stored and available for review in the ultrasound unit.  Impression: Technically successful ultrasound guided injection.    Assessment and Plan: 48 y.o. male with right knee pain.  Due to DJD.  Plan to proceed with repeat injection.  Continue conservative management including quad strength weight loss and NSAIDs.  Recheck in near future or sooner if needed.  Patient will order labs in preparation for upcoming well adult visit.  PDMP not reviewed this encounter. Orders Placed This Encounter  Procedures  . CBC  . COMPLETE METABOLIC PANEL WITH GFR  . Lipid Panel w/reflex Direct LDL  . Hemoglobin A1c   No orders of the defined types were placed in this encounter.   Historical information moved to improve visibility of documentation.  Past Medical History:  Diagnosis Date  . Anxiety 01/26/2017  . 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 ED (erectile dysfunction)   . Hypertension   . Morbid obesity ([place]) 04/21/2017   Past Surgical History:  Procedure Laterality Date  . APPENDECTOMY  2001  . HERNIA REPAIR    . KNEE SURGERY Right 11/27/2015  Partial medial and lateral meniscetomy and microfracture lateral femoral condyle. Dr Minda Meo [facility] [place]   Social History   Tobacco Use  . Smoking status: Never Smoker  . Smokeless tobacco: Never Used  Substance Use Topics  . Alcohol use: Not on file   family history includes Hypertension in his father and mother; Thyroid disease in his mother.  Medications: Current  Outpatient Medications  Medication Sig Dispense Refill  . atorvastatin (LIPITOR) 20 MG tablet Take 1 tablet (20 mg total) by mouth daily. 90 tablet 3  . EPINEPHrine (EPIPEN 2-PAK) 0.3 mg/0.3 mL IJ SOAJ injection Inject 0.3 mLs (0.3 mg total) into the muscle once. 2 Device 0  . latanoprost (XALATAN) 0.005 % ophthalmic solution INSTILL 1 DROP INTO EACH EYE AT BEDTIME    . olmesartan-hydrochlorothiazide (BENICAR HCT) 40-25 MG tablet Take 1 tablet by mouth daily. 90 tablet 0  . sertraline (ZOLOFT) 100 MG tablet Take 1 tablet (100 mg total) by mouth daily. 90 tablet 3  . tadalafil (ADCIRCA/CIALIS) 20 MG tablet TAKE 1 TABLET BY MOUTH ONCE DAILY AS NEEDED 6 tablet 19   No current facility-administered medications for this visit.    Allergies  Allergen Reactions  . Shellfish Allergy Anaphylaxis      Discussed warning signs or symptoms. Please see discharge instructions. Patient expresses understanding.

## 2019-02-02 LAB — LIPID PANEL W/REFLEX DIRECT LDL
Cholesterol: 158 mg/dL (ref <200)
HDL: 40 mg/dL (ref >=40)
LDL Cholesterol (Calc): 104 mg/dL (calc) — ABNORMAL HIGH
Non-HDL Cholesterol (Calc): 118 mg/dL (calc) (ref <130)
Total CHOL/HDL Ratio: 4 (calc) (ref <5.0)
Triglycerides: 58 mg/dL (ref <150)

## 2019-02-02 LAB — COMPLETE METABOLIC PANEL WITH GFR
AG Ratio: 1.7 (calc) (ref 1.0–2.5)
ALT: 22 U/L (ref 9–46)
AST: 28 U/L (ref 10–40)
Albumin: 4.5 g/dL (ref 3.6–5.1)
Alkaline phosphatase (APISO): 61 U/L (ref 36–130)
BUN: 22 mg/dL (ref 7–25)
CO2: 25 mmol/L (ref 20–32)
Calcium: 9.8 mg/dL (ref 8.6–10.3)
Chloride: 100 mmol/L (ref 98–110)
Creat: 1.3 mg/dL (ref 0.60–1.35)
GFR, Est African American: 75 mL/min/{1.73_m2} (ref >=60)
GFR, Est Non African American: 65 mL/min/{1.73_m2} (ref >=60)
Globulin: 2.6 g/dL (calc) (ref 1.9–3.7)
Glucose, Bld: 83 mg/dL (ref 65–99)
Potassium: 4.3 mmol/L (ref 3.5–5.3)
Sodium: 138 mmol/L (ref 135–146)
Total Bilirubin: 0.6 mg/dL (ref 0.2–1.2)
Total Protein: 7.1 g/dL (ref 6.1–8.1)

## 2019-02-02 LAB — CBC
HCT: 40.5 % (ref 38.5–50.0)
Hemoglobin: 13.9 g/dL (ref 13.2–17.1)
MCH: 30 pg (ref 27.0–33.0)
MCHC: 34.3 g/dL (ref 32.0–36.0)
MCV: 87.5 fL (ref 80.0–100.0)
MPV: 12.6 fL — ABNORMAL HIGH (ref 7.5–12.5)
Platelets: 203 10*3/uL (ref 140–400)
RBC: 4.63 10*6/uL (ref 4.20–5.80)
RDW: 14.1 % (ref 11.0–15.0)
WBC: 6.8 10*3/uL (ref 3.8–10.8)

## 2019-02-02 LAB — HEMOGLOBIN A1C
Hgb A1c MFr Bld: 5.9 % of total Hgb — ABNORMAL HIGH (ref <5.7)
Mean Plasma Glucose: 123 (calc)
eAG (mmol/L): 6.8 (calc)

## 2019-02-04 ENCOUNTER — Ambulatory Visit (INDEPENDENT_AMBULATORY_CARE_PROVIDER_SITE_OTHER): Payer: No Typology Code available for payment source | Admitting: Family Medicine

## 2019-02-04 ENCOUNTER — Encounter: Payer: Self-pay | Admitting: Family Medicine

## 2019-02-04 VITALS — BP 109/64 | HR 79 | Temp 98.4°F | Wt 271.0 lb

## 2019-02-04 DIAGNOSIS — Z0000 Encounter for general adult medical examination without abnormal findings: Secondary | ICD-10-CM

## 2019-02-04 DIAGNOSIS — I10 Essential (primary) hypertension: Secondary | ICD-10-CM | POA: Diagnosis not present

## 2019-02-04 DIAGNOSIS — F325 Major depressive disorder, single episode, in full remission: Secondary | ICD-10-CM | POA: Diagnosis not present

## 2019-02-04 NOTE — Patient Instructions (Signed)
Thank you for coming in today. Continue to work on weight and carbs.  That will help prediabetes. Continue to get sleep for mood and health.  Recheck in 6 -12 months or sooner if needed.

## 2019-02-04 NOTE — Progress Notes (Signed)
Albert Hogan is a 48 y.o. male who presents to [facility] today for well adult visit.   Albert Hogan is doing well.  He is trying to work on reducing his carbohydrates and calories in an effort to lose weight.  He is managed to lose a little bit of weight since the last visit.  Notes he is sleeping better and his mood is well controlled.  He is happy with how things are going.  He takes medications listed below.  No chest pain palpitation shortness of breath lightheadedness or dizziness.   ROS as above:  Past Medical History:  Diagnosis Date  . Anxiety 01/26/2017  . 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 ED (erectile dysfunction)   . Hypertension   . Morbid obesity ([place]) 04/21/2017   Past Surgical History:  Procedure Laterality Date  . APPENDECTOMY  2001  . HERNIA REPAIR    . KNEE SURGERY Right 11/27/2015   Partial medial and lateral meniscetomy and microfracture lateral femoral condyle. Dr Minda Meo [facility] [place]   Social History   Tobacco Use  . Smoking status: Never Smoker  . Smokeless tobacco: Never Used  Substance Use Topics  . Alcohol use: Not on file   family history includes Hypertension in his father and mother; Thyroid disease in his mother.  Medications: Current Outpatient Medications  Medication Sig Dispense Refill  . atorvastatin (LIPITOR) 20 MG tablet Take 1 tablet (20 mg total) by mouth daily. 90 tablet 3  . EPINEPHrine (EPIPEN 2-PAK) 0.3 mg/0.3 mL IJ SOAJ injection Inject 0.3 mLs (0.3 mg total) into the muscle once. 2 Device 0  . latanoprost (XALATAN) 0.005 % ophthalmic solution INSTILL 1 DROP INTO EACH EYE AT BEDTIME    . olmesartan-hydrochlorothiazide (BENICAR HCT) 40-25 MG tablet Take 1 tablet by mouth daily. 90 tablet 0  . sertraline (ZOLOFT) 100 MG tablet Take 1 tablet (100 mg total) by  mouth daily. 90 tablet 3  . tadalafil (ADCIRCA/CIALIS) 20 MG tablet TAKE 1 TABLET BY MOUTH ONCE DAILY AS NEEDED 6 tablet 19   No current facility-administered medications for this visit.    Allergies  Allergen Reactions  . Shellfish Allergy Anaphylaxis    Health Maintenance Health Maintenance  Topic Date Due  . INFLUENZA VACCINE  03/16/2019  . TETANUS/TDAP  08/15/2021  . HIV Screening  Completed     Exam:  BP 109/64   Pulse 79   Temp 98.4 F (36.9 C) (Oral)   Wt 271 lb (122.9 kg)   BMI 36.75 kg/m  Wt Readings from Last 5 Encounters:  02/04/19 271 lb (122.9 kg)  01/10/19 275 lb (124.7 kg)  10/30/18 278 lb (126.1 kg)  06/22/18 270 lb (122.5 kg)  04/20/18 269 lb 11.2 oz (122.3 kg)      Gen: Well NAD HEENT: EOMI,  MMM Lungs: Normal work of breathing. CTABL Heart: RRR no MRG Abd: NABS, Soft. Nondistended, Nontender Exts: Brisk capillary refill, warm and well perfused.  Psych: Alert and oriented normal speech thought process and affect.  Depression screen [facility] 2/9 02/04/2019 02/04/2019 10/30/2018 02/01/2018 04/21/2017  Decreased Interest 0 0 0 0 1  Down, Depressed, Hopeless 0 0 0 0 0  PHQ - 2 Score 0 0 0 0 1  Altered sleeping 0 0 0 1 1  Tired, decreased energy 0 0 0 1 1  Change in appetite 0 0 0 1 1  Feeling bad or failure  about yourself  0 0 0 1 0  Trouble concentrating 0 0 0 0 0  Moving slowly or fidgety/restless 0 0 0 0 0  Suicidal thoughts 0 0 0 0 0  PHQ-9 Score 0 0 0 4 4  Difficult doing work/chores Not difficult at all Not difficult at all Not difficult at all Not difficult at all -       Lab and Radiology Results Results for orders placed or performed in visit on 01/10/19 (from the past 72 hour(s))  CBC     Status: Abnormal   Collection Time: 02/01/19 12:24 PM  Result Value Ref Range   WBC 6.8 3.8 - 10.8 Thousand/uL   RBC 4.63 4.20 - 5.80 Million/uL   Hemoglobin 13.9 13.2 - 17.1 g/dL   HCT 40.5 38.5 - 50.0 %   MCV 87.5 80.0 - 100.0 fL   MCH 30.0 27.0  - 33.0 pg   MCHC 34.3 32.0 - 36.0 g/dL   RDW 14.1 11.0 - 15.0 %   Platelets 203 140 - 400 Thousand/uL   MPV 12.6 (H) 7.5 - 12.5 fL  COMPLETE METABOLIC PANEL WITH GFR     Status: None   Collection Time: 02/01/19 12:24 PM  Result Value Ref Range   Glucose, Bld 83 65 - 99 mg/dL    Comment: .            Fasting reference interval .    BUN 22 7 - 25 mg/dL   Creat 1.30 0.60 - 1.35 mg/dL   GFR, Est Non African American 65 > OR = 60 mL/min/1.82m2   GFR, Est African American 75 > OR = 60 mL/min/1.55m2   BUN/Creatinine Ratio NOT APPLICABLE 6 - 22 (calc)   Sodium 138 135 - 146 mmol/L   Potassium 4.3 3.5 - 5.3 mmol/L   Chloride 100 98 - 110 mmol/L   CO2 25 20 - 32 mmol/L   Calcium 9.8 8.6 - 10.3 mg/dL   Total Protein 7.1 6.1 - 8.1 g/dL   Albumin 4.5 3.6 - 5.1 g/dL   Globulin 2.6 1.9 - 3.7 g/dL (calc)   AG Ratio 1.7 1.0 - 2.5 (calc)   Total Bilirubin 0.6 0.2 - 1.2 mg/dL   Alkaline phosphatase (APISO) 61 36 - 130 U/L   AST 28 10 - 40 U/L   ALT 22 9 - 46 U/L  Lipid Panel w/reflex Direct LDL     Status: Abnormal   Collection Time: 02/01/19 12:24 PM  Result Value Ref Range   Cholesterol 158 <200 mg/dL   HDL 40 > OR = 40 mg/dL   Triglycerides 58 <150 mg/dL   LDL Cholesterol (Calc) 104 (H) mg/dL (calc)    Comment: Reference range: <100 . Desirable range <100 mg/dL for primary prevention;   <70 mg/dL for patients with CHD or diabetic patients  with > or = 2 CHD risk factors. Marland Kitchen LDL-C is now calculated using the Martin-Hopkins  calculation, which is a validated novel method providing  better accuracy than the Friedewald equation in the  estimation of LDL-C.  Cresenciano Genre et al. Annamaria Helling. 2536;644(03): 2061-2068  (http://education.[employer].com/faq/FAQ164)    Total CHOL/HDL Ratio 4.0 <5.0 (calc)   Non-HDL Cholesterol (Calc) 118 <130 mg/dL (calc)    Comment: For patients with diabetes plus 1 major ASCVD risk  factor, treating to a non-HDL-C goal of <100 mg/dL  (LDL-C of <70 mg/dL) is  considered a therapeutic  option.   Hemoglobin A1c     Status: Abnormal   Collection  Time: 02/01/19 12:24 PM  Result Value Ref Range   Hgb A1c MFr Bld 5.9 (H) <5.7 % of total Hgb    Comment: For someone without known diabetes, a hemoglobin  A1c value between 5.7% and 6.4% is consistent with prediabetes and should be confirmed with a  follow-up test. . For someone with known diabetes, a value <7% indicates that their diabetes is well controlled. A1c targets should be individualized based on duration of diabetes, age, comorbid conditions, and other considerations. . This assay result is consistent with an increased risk of diabetes. . Currently, no consensus exists regarding use of hemoglobin A1c for diagnosis of diabetes for children. .    Mean Plasma Glucose 123 (calc)   eAG (mmol/L) 6.8 (calc)   No results found.    Assessment and Plan: 48 y.o. male with adult.  Doing well.  Working on weight loss which is the highest yield most important thing in his life currently.  Continue medication management.  Recheck in 6 to 12 months.  Return sooner if needed.  PDMP not reviewed this encounter. No orders of the defined types were placed in this encounter.  No orders of the defined types were placed in this encounter.    Discussed warning signs or symptoms. Please see discharge instructions. Patient expresses understanding.

## 2019-02-14 ENCOUNTER — Other Ambulatory Visit: Payer: Self-pay

## 2019-02-14 ENCOUNTER — Ambulatory Visit (INDEPENDENT_AMBULATORY_CARE_PROVIDER_SITE_OTHER): Payer: No Typology Code available for payment source | Admitting: Family Medicine

## 2019-02-14 VITALS — BP 114/73 | HR 80 | Temp 98.1°F | Wt 268.0 lb

## 2019-02-14 DIAGNOSIS — M25561 Pain in right knee: Secondary | ICD-10-CM | POA: Diagnosis not present

## 2019-02-14 DIAGNOSIS — M25461 Effusion, right knee: Secondary | ICD-10-CM

## 2019-02-14 NOTE — Patient Instructions (Signed)
Thank you for coming in today. Call or go to the ER if you develop a large red swollen joint with extreme pain or oozing puss.  I will get results to you asap.  Hopefully the injection will help.  We will have a clear idea of gout soon.

## 2019-02-14 NOTE — Progress Notes (Signed)
Albert Hogan is a 48 y.o. male who presents to [facility] today for right knee pain and swelling last week.   Patient notes a few day history of swelling and pain.  He denies any locking or catching or giving way.  He denies any injury or significant change in activity.  He had similar symptoms late May and had a steroid injection then that did not help much.  He is tried over-the-counter medications for pain which helped only a little.   ROS:  As above  Exam:  BP 114/73   Pulse 80   Temp 98.1 F (36.7 C) (Oral)   Wt 268 lb (121.6 kg)   BMI 36.35 kg/m  Wt Readings from Last 5 Encounters:  02/14/19 268 lb (121.6 kg)  02/04/19 271 lb (122.9 kg)  01/10/19 275 lb (124.7 kg)  10/30/18 278 lb (126.1 kg)  06/22/18 270 lb (122.5 kg)   General: Well Developed, well nourished, and in no acute distress.  Neuro/Psych: Alert and oriented x3, extra-ocular muscles intact, able to move all 4 extremities, sensation grossly intact. Skin: Warm and dry, no rashes noted.  Respiratory: Not using accessory muscles, speaking in full sentences, trachea midline.  Cardiovascular: Pulses palpable, no extremity edema. Abdomen: Does not appear distended. MSK:  Right knee: Large effusion no erythema or induration or deformity otherwise. Diffusely mildly tender. Decreased range of motion.  5-110 degrees. Stable ligamentous exam.    Lab and Radiology Results Limited musculoskeletal ultrasound right knee reveals a large effusion at the suprapatellar space.  Normal bony structures.  Aspiration and injection right knee: Precautions reviewed.  Timeout performed permission granted. Lateral suprapatellar space identified.  Skin normal-appearing without erythema. Skin cleaned with alcohol and chlorhexidine and cold spray applied. 3 mL of lidocaine injected subcutaneously and deep along planned aspiration tract. Skin then cleaned again with chlorhexidine.   18-gauge needle was used to access the joint capsule and knee effusion. 50 mL of straw-colored fluid slightly cloudy aspirated. Syringe exchanged and 4 mL of Marcaine and 80 mg of Depo-Medrol injected.  During the injection patient jerked and a small portion was injected subcutaneously.  The majority of the Marcaine Depo-Medrol mixture was injected into the joint capsule. Bandage applied and patient tolerated procedure well.    Assessment and Plan: 48 y.o. male with right knee effusion and pain.  Concerning for gout or exacerbation of degenerative changes.  Patient had failure of recent injection which typically has worked well.  Plan for fluid analysis with cell count differential and crystal analysis.  Also will send for culture.  Injection performed today.  Hopefully will have better response.  Watchful waiting recheck as needed.   PDMP not reviewed this encounter. Orders Placed This Encounter  Procedures  . Anaerobic and Aerobic Culture    Right knee effusion  . Synovial cell count + diff, w/ crystals    Right knee effusion   No orders of the defined types were placed in this encounter.   Historical information moved to improve visibility of documentation.  Past Medical History:  Diagnosis Date  . Anxiety 01/26/2017  . 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 ED (erectile dysfunction)   . Hypertension   . Morbid obesity ([place]) 04/21/2017   Past Surgical History:  Procedure Laterality Date  . APPENDECTOMY  2001  . HERNIA REPAIR    . KNEE SURGERY Right 11/27/2015   Partial medial and lateral meniscetomy and microfracture lateral femoral condyle. Dr  [facility] [place]   Social History   Tobacco Use  . Smoking status: Never Smoker  . Smokeless tobacco: Never Used  Substance Use Topics  . Alcohol use: Not on file   family history includes Hypertension in his father and mother; Thyroid disease in his mother.   Medications: Current Outpatient Medications  Medication Sig Dispense Refill  . atorvastatin (LIPITOR) 20 MG tablet Take 1 tablet (20 mg total) by mouth daily. 90 tablet 3  . EPINEPHrine (EPIPEN 2-PAK) 0.3 mg/0.3 mL IJ SOAJ injection Inject 0.3 mLs (0.3 mg total) into the muscle once. 2 Device 0  . latanoprost (XALATAN) 0.005 % ophthalmic solution INSTILL 1 DROP INTO EACH EYE AT BEDTIME    . olmesartan-hydrochlorothiazide (BENICAR HCT) 40-25 MG tablet Take 1 tablet by mouth daily. 90 tablet 0  . sertraline (ZOLOFT) 100 MG tablet Take 1 tablet (100 mg total) by mouth daily. 90 tablet 3  . tadalafil (ADCIRCA/CIALIS) 20 MG tablet TAKE 1 TABLET BY MOUTH ONCE DAILY AS NEEDED 6 tablet 19   No current facility-administered medications for this visit.    Allergies  Allergen Reactions  . Shellfish Allergy Anaphylaxis      Discussed warning signs or symptoms. Please see discharge instructions. Patient expresses understanding.

## 2019-02-20 ENCOUNTER — Other Ambulatory Visit: Payer: Self-pay | Admitting: Family Medicine

## 2019-02-20 LAB — ANAEROBIC AND AEROBIC CULTURE
AER RESULT:: NO GROWTH
MICRO NUMBER:: 632144
MICRO NUMBER:: 632145
SPECIMEN QUALITY:: ADEQUATE
SPECIMEN QUALITY:: ADEQUATE

## 2019-02-20 LAB — SYNOVIAL CELL COUNT + DIFF, W/ CRYSTALS
Basophils, %: 0 %
Eosinophils-Synovial: 0 % (ref 0–2)
Lymphocytes-Synovial Fld: 36 % (ref 0–74)
Monocyte/Macrophage: 48 % (ref 0–69)
Neutrophil, Synovial: 12 % (ref 0–24)
Synoviocytes, %: 4 % (ref 0–15)
WBC, Synovial: 113 cells/uL (ref <150)

## 2019-03-04 ENCOUNTER — Other Ambulatory Visit: Payer: Self-pay | Admitting: Sports Medicine

## 2019-04-09 ENCOUNTER — Other Ambulatory Visit: Payer: Self-pay | Admitting: Family Medicine

## 2019-04-24 ENCOUNTER — Other Ambulatory Visit: Payer: Self-pay

## 2019-04-24 ENCOUNTER — Encounter: Payer: Self-pay | Admitting: Family Medicine

## 2019-04-24 ENCOUNTER — Ambulatory Visit (INDEPENDENT_AMBULATORY_CARE_PROVIDER_SITE_OTHER): Payer: No Typology Code available for payment source | Admitting: Family Medicine

## 2019-04-24 VITALS — BP 121/87 | HR 79 | Wt 265.0 lb

## 2019-04-24 DIAGNOSIS — M25561 Pain in right knee: Secondary | ICD-10-CM

## 2019-04-24 NOTE — Progress Notes (Signed)
Albert Hogan is a 48 y.o. male who presents to [facility] today for right knee pain.  Right knee pain.  Patient was last seen for this issue on July 2 where he had aspiration and injection.  Fluid was sent for analysis and no gout crystals were seen.  Last x-ray was August 2018 which showed moderate to severe right medial compartment joint space loss.  He notes the injection worked quite well.  He notes the pain is starting to return along with the swelling.  If not nearly as bad as it has been in the past however this week and he is planning on doing a golf trip which will involve quite a bit of walking.  He is worried it will hurt then and would like a preemptive injection if possible.  He notes that he has been trying to exercise to improve his quad strength and to lose weight.  He has been doing some lifting at the gym and trying to eat more healthfully.  He has lost some weight and feels pretty well.    ROS:  As above  Exam:  BP 121/87    Pulse 79    Wt 265 lb (120.2 kg)    SpO2 99%    BMI 35.94 kg/m  Wt Readings from Last 5 Encounters:  04/24/19 265 lb (120.2 kg)  02/14/19 268 lb (121.6 kg)  02/04/19 271 lb (122.9 kg)  01/10/19 275 lb (124.7 kg)  10/30/18 278 lb (126.1 kg)   General: Well Developed, well nourished, and in no acute distress.  Neuro/Psych: Alert and oriented x3, extra-ocular muscles intact, able to move all 4 extremities, sensation grossly intact. Skin: Warm and dry, no rashes noted.  Respiratory: Not using accessory muscles, speaking in full sentences, trachea midline.  Cardiovascular: Pulses palpable, no extremity edema. Abdomen: Does not appear distended. MSK:  Right knee: Moderate effusion with no erythema or deformities. Normal motion with crepitations.  Stable ligamentous exam.    Lab and Radiology Results Procedure: Real-time Ultrasound Guided Injection of right knee Device: GE Logiq E   Images  permanently stored and available for review in the ultrasound unit. Verbal informed consent obtained.  Discussed risks and benefits of procedure. Warned about infection bleeding damage to structures skin hypopigmentation and fat atrophy among others. Patient expresses understanding and agreement Time-out conducted.   Noted no overlying erythema, induration, or other signs of local infection.   Skin prepped in a sterile fashion.   Local anesthesia: Topical Ethyl chloride.   With sterile technique and under real time ultrasound guidance:  80 mg of Kenalog and 3 mL of Marcaine injected easily.   Completed without difficulty   Pain immediately resolved suggesting accurate placement of the medication.   Advised to call if fevers/chills, erythema, induration, drainage, or persistent bleeding.   Images permanently stored and available for review in the ultrasound unit.  Impression: Technically successful ultrasound guided injection.         Assessment and Plan: 48 y.o. male with right knee pain.  Degenerative.  Plan for repeat injection today.  Discussed that steroid injections really should be done only every 3 months and not much sooner than that.  He is already had trials of hyaluronic acid injections with little benefit.  Ultimately were buying time for total knee replacement.  Discussed the importance of quad strengthening and weight loss as evidence-based approach to managing knee pain due to DJD.  Regarding the rest of his health.  Labs were normal when last checked in June as part of a wellness visit.  Plan to continue current regimen.  Recheck with new PCP June 2021.    Check back with myself or Dr. Darene Lamer for continuing sports medicine related issues into the future.  Flu vaccine declined today.  I informed patient that I am transitioning to sports medicine only [place] [facility] in [place] starting in November.  Happy to see patient for continued sports medicine needs.   Discussed need for new PCP.  Provided some recommendations.   Historical information moved to improve visibility of documentation.  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 Dr Minda Meo [facility] [place]   Social History   Tobacco Use   Smoking status: Never Smoker   Smokeless tobacco: Never Used  Substance Use Topics   Alcohol use: Not on file   family history includes Hypertension in his father and mother; Thyroid disease in his mother.  Medications: Current Outpatient Medications  Medication Sig Dispense Refill   atorvastatin (LIPITOR) 20 MG tablet Take 1 tablet by mouth once daily 90 tablet 3   EPINEPHrine (EPIPEN 2-PAK) 0.3 mg/0.3 mL IJ SOAJ injection Inject 0.3 mLs (0.3 mg total) into the muscle once. 2 Device 0   latanoprost (XALATAN) 0.005 % ophthalmic solution INSTILL 1 DROP INTO EACH EYE AT BEDTIME     olmesartan-hydrochlorothiazide (BENICAR HCT) 40-25 MG tablet Take 1 tablet by mouth once daily 90 tablet 0   sertraline (ZOLOFT) 100 MG tablet Take 1 tablet (100 mg total) by mouth daily. 90 tablet 3   tadalafil (CIALIS) 20 MG tablet TAKE 1 TABLET BY MOUTH ONCE DAILY AS NEEDED 6 tablet 0   No current facility-administered medications for this visit.    Allergies  Allergen Reactions   Shellfish Allergy Anaphylaxis      Discussed warning signs or symptoms. Please see discharge instructions. Patient expresses understanding.

## 2019-04-24 NOTE — Patient Instructions (Addendum)
Thank you for coming in today.  Call or go to the ER if you develop a large red swollen joint with extreme pain or oozing puss.   Keep working on quad strength and weight loss. That will help knee pain and delay time to total knee replacement.    I will be moving to full time Sports Medicine in [place] starting on November 1st.  You will still be able to see me for your Sports Medicine or Orthopedic needs at [facility] in [place]. I will still be part of [place].    If you want to stay locally for your Sports Medicine issues Dr. Dianah Field here in [place] will be happy to see you.  Additionally Dr. Clearance Coots at [facility] will be happy to see you for sports medicine issues more locally.   For your primary care needs you are welcome to establish care with Dr. Emeterio Reeve.  We are working quickly to hire more physicians to cover the primary care needs however if you cannot get an appointment with Dr. Sheppard Coil in a timely manner [facility] has locations and openings for primary care services nearby.   [facility] at [facility] [address] . [employer] , [place]: [phone] . Behavioral Medicine: [phone] . Fax: [place] at [employer] [address] . [place], [place]: [phone] . Behavioral Medicine: [phone] . Fax: [phone] . Hours (M-F): 7am - Academic librarian At [facility]. [place], [place]: [phone] . Behavioral Medicine: [phone] . Fax: [phone] . Hours (M-F): 8am - Optician, dispensing at [employer] . [place], [place] Phone: [phone] . Behavioral Medicine: [phone] . Fax: [phone]

## 2019-05-16 ENCOUNTER — Other Ambulatory Visit: Payer: Self-pay | Admitting: Family Medicine

## 2019-05-30 ENCOUNTER — Other Ambulatory Visit: Payer: Self-pay | Admitting: Sports Medicine

## 2019-05-30 NOTE — Telephone Encounter (Signed)
To PCP

## 2019-07-02 ENCOUNTER — Other Ambulatory Visit: Payer: Self-pay

## 2019-07-02 ENCOUNTER — Ambulatory Visit (INDEPENDENT_AMBULATORY_CARE_PROVIDER_SITE_OTHER): Payer: No Typology Code available for payment source | Admitting: Sports Medicine

## 2019-07-02 ENCOUNTER — Encounter: Payer: Self-pay | Admitting: Sports Medicine

## 2019-07-02 DIAGNOSIS — M170 Bilateral primary osteoarthritis of knee: Secondary | ICD-10-CM

## 2019-07-02 NOTE — Progress Notes (Signed)
Subjective:    CC: Left knee pain  HPI: This is a pleasant 48 year old male, he has bilateral knee osteoarthritis, has not had a left knee injection in many months, now having recurrence of pain, moderate, persistent, localized at the joint line, mild swelling, no radiation.  I reviewed the past medical history, family history, social history, surgical history, and allergies today and no changes were needed.  Please see the problem list section below in epic for further details.  Past Medical History: Past Medical History:  Diagnosis Date  . Anxiety 01/26/2017  . Depression, major, single episode, complete remission ([place]) 02/01/2018  . ED (erectile dysfunction)   . Hypertension   . Morbid obesity ([place]) 04/21/2017   Past Surgical History: Past Surgical History:  Procedure Laterality Date  . APPENDECTOMY  2001  . HERNIA REPAIR    . KNEE SURGERY Right 11/27/2015   Partial medial and lateral meniscetomy and microfracture lateral femoral condyle. Dr Minda Meo [facility] [place]   Social History: Social History   Socioeconomic History  . Marital status: Single    Spouse name: Not on file  . Number of children: Not on file  . Years of education: Not on file  . Highest education level: Not on file  Occupational History  . Not on file  Social Needs  . Financial resource strain: Not on file  . Food insecurity    Worry: Not on file    Inability: Not on file  . Transportation needs    Medical: Not on file    Non-medical: Not on file  Tobacco Use  . Smoking status: Never Smoker  . Smokeless tobacco: Never Used  Substance and Sexual Activity  . Alcohol use: Not on file  . Drug use: No  . Sexual activity: Not on file  Lifestyle  . Physical activity    Days per week: 4 days    Minutes per session: 30 min  . Stress: Not on file  Relationships  . Social Herbalist on phone: Not on file    Gets together: Not on file    Attends religious service: Not  on file    Active member of club or organization: Not on file    Attends meetings of clubs or organizations: Not on file    Relationship status: Not on file  Other Topics Concern  . Not on file  Social History Narrative  . Not on file   Family History: Family History  Problem Relation Age of Onset  . Hypertension Mother   . Thyroid disease Mother   . Hypertension Father    Allergies: Allergies  Allergen Reactions  . Shellfish Allergy Anaphylaxis   Medications: See med rec.  Review of Systems: No fevers, chills, night sweats, weight loss, chest pain, or shortness of breath.   Objective:    General: Well Developed, well nourished, and in no acute distress.  Neuro: Alert and oriented x3, extra-ocular muscles intact, sensation grossly intact.  HEENT: Normocephalic, atraumatic, pupils equal round reactive to light, neck supple, no masses, no lymphadenopathy, thyroid nonpalpable.  Skin: Warm and dry, no rashes. Cardiac: Regular rate and rhythm, no murmurs rubs or gallops, no lower extremity edema.  Respiratory: Clear to auscultation bilaterally. Not using accessory muscles, speaking in full sentences. Left knee: Normal to inspection with no erythema or effusion or obvious bony abnormalities. Mild swelling, small effusion ROM normal in flexion and extension and lower leg rotation. Ligaments with solid consistent endpoints including ACL,  PCL, LCL, MCL. Negative Mcmurray's and provocative meniscal tests. Non painful patellar compression. Patellar and quadriceps tendons unremarkable. Hamstring and quadriceps strength is normal.  Procedure: Real-time Ultrasound Guided injection of the left knee Device: Samsung HS60  Verbal informed consent obtained.  Time-out conducted.  Noted no overlying erythema, induration, or other signs of local infection.  Skin prepped in a sterile fashion.  Local anesthesia: Topical Ethyl chloride.  With sterile technique and under real time ultrasound  guidance: 1 cc Kenalog 40, 2 cc lidocaine, 2 cc bupivacaine injected easily Completed without difficulty  Pain immediately resolved suggesting accurate placement of the medication.  Advised to call if fevers/chills, erythema, induration, drainage, or persistent bleeding.  Images permanently stored and available for review in the ultrasound unit.  Impression: Technically successful ultrasound guided injection.  Impression and Recommendations:    Primary osteoarthritis of both knees Post arthroscopic partial meniscectomy on the right, doing well after an injection back in September. Left knee is hurting, slightly swollen, left knee injected today, return as needed. We discussed icing, bracing, activity modification, rehab exercises which will be emailed to him. Return as needed, if he desires to proceed with viscosupplementation he will call us first for approval.   ___________________________________________ Gwen Her. Dianah Field, M.D., ABFM., CAQSM. Primary Care and Sports Medicine [facility]  Adjunct Professor of [place] of [facility] of Medicine

## 2019-07-02 NOTE — Assessment & Plan Note (Signed)
Post arthroscopic partial meniscectomy on the right, doing well after an injection back in September. Left knee is hurting, slightly swollen, left knee injected today, return as needed. We discussed icing, bracing, activity modification, rehab exercises which will be emailed to him. Return as needed, if he desires to proceed with viscosupplementation he will call us first for approval.

## 2019-07-10 ENCOUNTER — Other Ambulatory Visit: Payer: Self-pay | Admitting: Sports Medicine

## 2019-07-10 MED ORDER — TADALAFIL 20 MG PO TABS: 20.0000 mg | ORAL_TABLET | Freq: Every day | ORAL | 0 refills | Status: DC | PRN

## 2019-08-06 ENCOUNTER — Ambulatory Visit: Payer: No Typology Code available for payment source | Admitting: Family Medicine

## 2019-08-06 ENCOUNTER — Ambulatory Visit: Payer: No Typology Code available for payment source | Admitting: Osteopathic Medicine

## 2019-08-16 ENCOUNTER — Other Ambulatory Visit: Payer: Self-pay | Admitting: Family Medicine

## 2019-09-05 ENCOUNTER — Ambulatory Visit (INDEPENDENT_AMBULATORY_CARE_PROVIDER_SITE_OTHER): Payer: No Typology Code available for payment source | Admitting: Sports Medicine

## 2019-09-05 ENCOUNTER — Other Ambulatory Visit: Payer: Self-pay

## 2019-09-05 DIAGNOSIS — M170 Bilateral primary osteoarthritis of knee: Secondary | ICD-10-CM

## 2019-09-05 MED ORDER — TRAMADOL HCL 50 MG PO TABS: 50.0000 mg | ORAL_TABLET | Freq: Three times a day (TID) | ORAL | 0 refills | Status: DC | PRN

## 2019-09-05 NOTE — Assessment & Plan Note (Addendum)
Albert Hogan returns, he has persistence of his left knee pain, we did an injection about 2 months ago. He has pain at the medial joint line, as well as approximately 5 degrees of extension lag. Ligaments are stable, mild effusion. Positive McMurray's sign. At this point we are going to proceed with an MRI as he is having some mechanical symptoms. Should his MRI show arthritis and meniscal fraying we will proceed with viscosupplementation. I am going to go ahead and get him approved. If it shows a focal cartilage defect, intra-articular loose body, or large meniscal tear I will refer him for arthroscopy. Tramadol for pain in the meantime, return to see me after MRI. We also discussed PRP as well as its limitations, and stem cell treatment.

## 2019-09-05 NOTE — Progress Notes (Addendum)
    Procedures performed today:    None.  Independent interpretation of tests performed by another provider:   None.  Impression and Recommendations:    Primary osteoarthritis of both knees Andi returns, he has persistence of his left knee pain, we did an injection about 2 months ago. He has pain at the medial joint line, as well as approximately 5 degrees of extension lag. Ligaments are stable, mild effusion. Positive McMurray's sign. At this point we are going to proceed with an MRI as he is having some mechanical symptoms. Should his MRI show arthritis and meniscal fraying we will proceed with viscosupplementation. I am going to go ahead and get him approved. If it shows a focal cartilage defect, intra-articular loose body, or large meniscal tear I will refer him for arthroscopy. Tramadol for pain in the meantime, return to see me after MRI. We also discussed PRP as well as its limitations, and stem cell treatment.    ___________________________________________ Gwen Her. Dianah Field, M.D., ABFM., CAQSM. Primary Care and [place] Instructor of [place] of [facility] of Medicine

## 2019-09-09 ENCOUNTER — Ambulatory Visit (INDEPENDENT_AMBULATORY_CARE_PROVIDER_SITE_OTHER): Payer: No Typology Code available for payment source

## 2019-09-09 ENCOUNTER — Other Ambulatory Visit: Payer: Self-pay

## 2019-09-09 DIAGNOSIS — M170 Bilateral primary osteoarthritis of knee: Secondary | ICD-10-CM

## 2019-09-10 ENCOUNTER — Telehealth: Payer: Self-pay | Admitting: Sports Medicine

## 2019-09-10 ENCOUNTER — Ambulatory Visit (INDEPENDENT_AMBULATORY_CARE_PROVIDER_SITE_OTHER): Payer: No Typology Code available for payment source | Admitting: Sports Medicine

## 2019-09-10 DIAGNOSIS — M170 Bilateral primary osteoarthritis of knee: Secondary | ICD-10-CM | POA: Diagnosis not present

## 2019-09-10 NOTE — Progress Notes (Signed)
    Procedures performed today:    None.  Independent interpretation of tests performed by another provider:   None.  Impression and Recommendations:    Primary osteoarthritis of both knees Cordara returns, he had persistence of left knee pain so we did an injection, continue to have discomfort, ultimately he was also complaining of mechanical symptoms so we obtained an MRI, it did not show any meniscal tears or loose bodies, simply full-thickness cartilage defects throughout the knee. Because this likely represents simple osteoarthritis we are going to proceed with viscosupplementation, but in both knees. He will come back when Orthovisc is approved.    ___________________________________________ Gwen Her. Dianah Field, M.D., ABFM., CAQSM. Primary Care and [place] Instructor of [place] of [facility] of Medicine

## 2019-09-10 NOTE — Assessment & Plan Note (Signed)
Chasyn returns, he had persistence of left knee pain so we did an injection, continue to have discomfort, ultimately he was also complaining of mechanical symptoms so we obtained an MRI, it did not show any meniscal tears or loose bodies, simply full-thickness cartilage defects throughout the knee. Because this likely represents simple osteoarthritis we are going to proceed with viscosupplementation, but in both knees. He will come back when Orthovisc is approved.

## 2019-09-10 NOTE — Telephone Encounter (Signed)
Orthovisc approval please, both knees, x-ray confirmed

## 2019-09-12 ENCOUNTER — Other Ambulatory Visit: Payer: Self-pay | Admitting: Family Medicine

## 2019-09-12 MED ORDER — TADALAFIL 20 MG PO TABS: 20.0000 mg | ORAL_TABLET | Freq: Every day | ORAL | 0 refills | Status: DC | PRN

## 2019-09-25 NOTE — Telephone Encounter (Signed)
Good question.  [place] Pall?

## 2019-09-25 NOTE — Telephone Encounter (Signed)
Looks like this was from 09/10/19, do you know any update on this?

## 2019-09-26 NOTE — Telephone Encounter (Signed)
I checked with insurance and this patient is covered under insurance. The deductible applies and patient estimated cost is 40% of the injection. I have sent a mychart message for the patient to let us know if he is ok with the estimate. No other questions.

## 2019-09-26 NOTE — Telephone Encounter (Signed)
See other note

## 2019-10-02 ENCOUNTER — Other Ambulatory Visit: Payer: Self-pay

## 2019-10-02 ENCOUNTER — Ambulatory Visit (INDEPENDENT_AMBULATORY_CARE_PROVIDER_SITE_OTHER): Payer: No Typology Code available for payment source | Admitting: Sports Medicine

## 2019-10-02 ENCOUNTER — Encounter: Payer: Self-pay | Admitting: Sports Medicine

## 2019-10-02 DIAGNOSIS — M170 Bilateral primary osteoarthritis of knee: Secondary | ICD-10-CM | POA: Diagnosis not present

## 2019-10-02 MED ORDER — HYDROCODONE-ACETAMINOPHEN 5-325 MG PO TABS: 1.0000 | ORAL_TABLET | Freq: Three times a day (TID) | ORAL | 0 refills | Status: DC | PRN

## 2019-10-02 NOTE — Addendum Note (Signed)
Addended by: Silverio Decamp on: 10/02/2019 09:59 AM   Modules accepted: Orders

## 2019-10-02 NOTE — Assessment & Plan Note (Addendum)
Tyral returns, his right knee is doing very well actually. Left knee is still hurting so we performed an aspiration and Orthovisc injection #1 of 4 into the left knee, return in 1 week for #2 of 4. If the right knee does start to hurt we can certainly start the Orthovisc there is on that side as well. There was some procedural pain associated with the injection so I will add short course of hydrocodone to use 1 to 2 hours before the injection.

## 2019-10-02 NOTE — Progress Notes (Addendum)
   Procedure: Real-time Ultrasound Guided aspiration/injection of the left knee Device: Samsung HS60  Verbal informed consent obtained.  Time-out conducted.  Noted no overlying erythema, induration, or other signs of local infection.  Skin prepped in a sterile fashion.  Local anesthesia: Topical Ethyl chloride.  With sterile technique and under real time ultrasound guidance:  I advanced a 18-gauge needle into the suprapatellar recess, aspirated 25 cc of clear, straw-colored fluid, syringe switched and 30 mg/2 mL of OrthoVisc (sodium hyaluronate) in a prefilled syringe was injected easily into the knee Completed without difficulty  Pain immediately resolved suggesting accurate placement of the medication.  Advised to call if fevers/chills, erythema, induration, drainage, or persistent bleeding.  Images permanently stored and available for review in the ultrasound unit.  Impression: Technically successful ultrasound guided injection.

## 2019-10-09 ENCOUNTER — Ambulatory Visit: Payer: No Typology Code available for payment source | Admitting: Sports Medicine

## 2019-10-10 ENCOUNTER — Ambulatory Visit (INDEPENDENT_AMBULATORY_CARE_PROVIDER_SITE_OTHER): Payer: No Typology Code available for payment source | Admitting: Sports Medicine

## 2019-10-10 ENCOUNTER — Other Ambulatory Visit: Payer: Self-pay

## 2019-10-10 DIAGNOSIS — M170 Bilateral primary osteoarthritis of knee: Secondary | ICD-10-CM | POA: Diagnosis not present

## 2019-10-10 NOTE — Assessment & Plan Note (Signed)
Orthovisc No. 2 of 4 into the left knee, return in 1 week for #3 of 4, starting to feel some improvement in pain. We can do his right knee when he desires.

## 2019-10-10 NOTE — Progress Notes (Signed)
    Procedures performed today:    Procedure: Real-time Ultrasound Guided injection of the left knee Device: Samsung HS60  Verbal informed consent obtained.  Time-out conducted.  Noted no overlying erythema, induration, or other signs of local infection.  Skin prepped in a sterile fashion.  Local anesthesia: Topical Ethyl chloride.  With sterile technique and under real time ultrasound guidance:  30 mg/2 mL of OrthoVisc (sodium hyaluronate) in a prefilled syringe was injected easily into the knee through a 22-gauge needle.  Completed without difficulty  Pain immediately resolved suggesting accurate placement of the medication.  Advised to call if fevers/chills, erythema, induration, drainage, or persistent bleeding.  Images permanently stored and available for review in the ultrasound unit.  Impression: Technically successful ultrasound guided injection.  Independent interpretation of tests performed by another provider:   None.  Impression and Recommendations:    Primary osteoarthritis of both knees Orthovisc No. 2 of 4 into the left knee, return in 1 week for #3 of 4, starting to feel some improvement in pain. We can do his right knee when he desires.    ___________________________________________ Gwen Her. Dianah Field, M.D., ABFM., CAQSM. Primary Care and [place] Instructor of [place] of [facility] of Medicine

## 2019-10-16 ENCOUNTER — Ambulatory Visit: Payer: No Typology Code available for payment source | Admitting: Sports Medicine

## 2019-10-18 ENCOUNTER — Ambulatory Visit (INDEPENDENT_AMBULATORY_CARE_PROVIDER_SITE_OTHER): Payer: No Typology Code available for payment source | Admitting: Sports Medicine

## 2019-10-18 ENCOUNTER — Other Ambulatory Visit: Payer: Self-pay

## 2019-10-18 ENCOUNTER — Encounter: Payer: Self-pay | Admitting: Sports Medicine

## 2019-10-18 DIAGNOSIS — M170 Bilateral primary osteoarthritis of knee: Secondary | ICD-10-CM | POA: Diagnosis not present

## 2019-10-18 NOTE — Assessment & Plan Note (Signed)
Orthovisc No. 3 of 4 into the left knee, return in 1 week for #4 of 4. We can do his right knee when he desires.

## 2019-10-18 NOTE — Progress Notes (Addendum)
    Procedures performed today:    Procedure: Real-time Ultrasound Guided  aspiration/injection of the right knee Device: Samsung HS60  Verbal informed consent obtained.  Time-out conducted.  Noted no overlying erythema, induration, or other signs of local infection.  Skin prepped in a sterile fashion.  Local anesthesia: Topical Ethyl chloride.  With sterile technique and under real time ultrasound guidance:  Using an 18-gauge needle aspirated 28 cc of clear straw-colored fluid, syringe switched and 30 mg/2 mL of OrthoVisc (sodium hyaluronate) in a prefilled syringe was injected easily into the knee. Completed without difficulty  Pain immediately resolved suggesting accurate placement of the medication.  Advised to call if fevers/chills, erythema, induration, drainage, or persistent bleeding.  Images permanently stored and available for review in the ultrasound unit.  Impression: Technically successful ultrasound guided injection.  Independent interpretation of notes and tests performed by another provider:   None.  Impression and Recommendations:    Primary osteoarthritis of both knees Orthovisc No. 3 of 4 into the left knee, return in 1 week for #4 of 4. We can do his right knee when he desires.    ___________________________________________ Gwen Her. Dianah Field, M.D., ABFM., CAQSM. Primary Care and [place] Instructor of [place] of [facility] of Medicine

## 2019-10-25 ENCOUNTER — Ambulatory Visit (INDEPENDENT_AMBULATORY_CARE_PROVIDER_SITE_OTHER): Payer: No Typology Code available for payment source | Admitting: Sports Medicine

## 2019-10-25 ENCOUNTER — Ambulatory Visit: Payer: No Typology Code available for payment source | Admitting: Osteopathic Medicine

## 2019-10-25 ENCOUNTER — Ambulatory Visit: Payer: No Typology Code available for payment source | Admitting: Sports Medicine

## 2019-10-25 DIAGNOSIS — M170 Bilateral primary osteoarthritis of knee: Secondary | ICD-10-CM

## 2019-10-25 NOTE — Progress Notes (Signed)
    Procedures performed today:    Procedure: Real-time Ultrasound Guided aspiration/injection of the right knee Device: Samsung HS60  Verbal informed consent obtained.  Time-out conducted.  Noted no overlying erythema, induration, or other signs of local infection.  Skin prepped in a sterile fashion.  Local anesthesia: Topical Ethyl chloride.  With sterile technique and under real time ultrasound guidance: Using an 18-gauge needle aspirated 35 cc of clear straw-colored fluid, syringe switched and 30 mg/2 mL of OrthoVisc (sodium hyaluronate) in a prefilled syringe was injected easily into the knee. Completed without difficulty  Pain immediately resolved suggesting accurate placement of the medication.  Advised to call if fevers/chills, erythema, induration, drainage, or persistent bleeding.  Images permanently stored and available for review in the ultrasound unit.  Impression: Technically successful ultrasound guided injection.  Independent interpretation of notes and tests performed by another provider:   None.  Impression and Recommendations:    Primary osteoarthritis of both knees Aspiration and Orthovisc No. 4 of 4 to the left knee, return in 1 month to reevaluate efficacy. He is still having some discomfort but some of this is related to his recurrent effusions, I did advise him that these would likely resolve after he finished the series and stop traumatizing his joint capsule. We can certainly do the right knee if needed. He is going to establish care with Dr. Zigmund Daniel, he does have significant lower extremity pitting edema.    ___________________________________________ Gwen Her. Dianah Field, M.D., ABFM., CAQSM. Primary Care and [place] Instructor of [place] of [facility] of Medicine

## 2019-10-25 NOTE — Assessment & Plan Note (Addendum)
Aspiration and Orthovisc No. 4 of 4 to the left knee, return in 1 month to reevaluate efficacy. He is still having some discomfort but some of this is related to his recurrent effusions, I did advise him that these would likely resolve after he finished the series and stop traumatizing his joint capsule. We can certainly do the right knee if needed. He is going to establish care with Dr. Zigmund Daniel, he does have significant lower extremity pitting edema.

## 2019-10-29 ENCOUNTER — Ambulatory Visit (INDEPENDENT_AMBULATORY_CARE_PROVIDER_SITE_OTHER): Payer: No Typology Code available for payment source | Admitting: Family Medicine

## 2019-10-29 ENCOUNTER — Encounter: Payer: Self-pay | Admitting: Family Medicine

## 2019-10-29 ENCOUNTER — Other Ambulatory Visit: Payer: Self-pay

## 2019-10-29 VITALS — BP 135/84 | HR 85 | Temp 98.1°F | Ht 72.0 in | Wt 272.0 lb

## 2019-10-29 DIAGNOSIS — M5136 Other intervertebral disc degeneration, lumbar region: Secondary | ICD-10-CM | POA: Diagnosis not present

## 2019-10-29 DIAGNOSIS — F325 Major depressive disorder, single episode, in full remission: Secondary | ICD-10-CM

## 2019-10-29 DIAGNOSIS — M170 Bilateral primary osteoarthritis of knee: Secondary | ICD-10-CM

## 2019-10-29 DIAGNOSIS — E782 Mixed hyperlipidemia: Secondary | ICD-10-CM | POA: Diagnosis not present

## 2019-10-29 DIAGNOSIS — I10 Essential (primary) hypertension: Secondary | ICD-10-CM

## 2019-10-29 MED ORDER — METHYLPREDNISOLONE 4 MG PO TBPK: ORAL_TABLET | ORAL | 0 refills | Status: DC

## 2019-10-29 MED ORDER — CYCLOBENZAPRINE HCL 10 MG PO TABS: 10.0000 mg | ORAL_TABLET | Freq: Three times a day (TID) | ORAL | 0 refills | Status: DC | PRN

## 2019-10-29 NOTE — Assessment & Plan Note (Signed)
Seeing Dr. Dianah Field for this, recently completed orthovisc series.

## 2019-10-29 NOTE — Assessment & Plan Note (Signed)
Stable off of sertraline.

## 2019-10-29 NOTE — Patient Instructions (Signed)
Great to meet you today! Please have labs completed a couple of weeks before your physical appointment.  Continue current medications.    Low Back Sprain or Strain Rehab Ask your health care provider which exercises are safe for you. Do exercises exactly as told by your health care provider and adjust them as directed. It is normal to feel mild stretching, pulling, tightness, or discomfort as you do these exercises. Stop right away if you feel sudden pain or your pain gets worse. Do not begin these exercises until told by your health care provider. Stretching and range-of-motion exercises These exercises warm up your muscles and joints and improve the movement and flexibility of your back. These exercises also help to relieve pain, numbness, and tingling. Lumbar rotation  1. Lie on your back on a firm surface and bend your knees. 2. Straighten your arms out to your sides so each arm forms a 90-degree angle (right angle) with a side of your body. 3. Slowly move (rotate) both of your knees to one side of your body until you feel a stretch in your lower back (lumbar). Try not to let your shoulders lift off the floor. 4. Hold this position for __________ seconds. 5. Tense your abdominal muscles and slowly move your knees back to the starting position. 6. Repeat this exercise on the other side of your body. Repeat __________ times. Complete this exercise __________ times a day. Single knee to chest  1. Lie on your back on a firm surface with both legs straight. 2. Bend one of your knees. Use your hands to move your knee up toward your chest until you feel a gentle stretch in your lower back and buttock. ? Hold your leg in this position by holding on to the front of your knee. ? Keep your other leg as straight as possible. 3. Hold this position for __________ seconds. 4. Slowly return to the starting position. 5. Repeat with your other leg. Repeat __________ times. Complete this exercise  __________ times a day. Prone extension on elbows  1. Lie on your abdomen on a firm surface (prone position). 2. Prop yourself up on your elbows. 3. Use your arms to help lift your chest up until you feel a gentle stretch in your abdomen and your lower back. ? This will place some of your body weight on your elbows. If this is uncomfortable, try stacking pillows under your chest. ? Your hips should stay down, against the surface that you are lying on. Keep your hip and back muscles relaxed. 4. Hold this position for __________ seconds. 5. Slowly relax your upper body and return to the starting position. Repeat __________ times. Complete this exercise __________ times a day. Strengthening exercises These exercises build strength and endurance in your back. Endurance is the ability to use your muscles for a long time, even after they get tired. Pelvic tilt This exercise strengthens the muscles that lie deep in the abdomen. 1. Lie on your back on a firm surface. Bend your knees and keep your feet flat on the floor. 2. Tense your abdominal muscles. Tip your pelvis up toward the ceiling and flatten your lower back into the floor. ? To help with this exercise, you may place a small towel under your lower back and try to push your back into the towel. 3. Hold this position for __________ seconds. 4. Let your muscles relax completely before you repeat this exercise. Repeat __________ times. Complete this exercise __________ times a day. Alternating  arm and leg raises  1. Get on your hands and knees on a firm surface. If you are on a hard floor, you may want to use padding, such as an exercise mat, to cushion your knees. 2. Line up your arms and legs. Your hands should be directly below your shoulders, and your knees should be directly below your hips. 3. Lift your left leg behind you. At the same time, raise your right arm and straighten it in front of you. ? Do not lift your leg higher than your  hip. ? Do not lift your arm higher than your shoulder. ? Keep your abdominal and back muscles tight. ? Keep your hips facing the ground. ? Do not arch your back. ? Keep your balance carefully, and do not hold your breath. 4. Hold this position for __________ seconds. 5. Slowly return to the starting position. 6. Repeat with your right leg and your left arm. Repeat __________ times. Complete this exercise __________ times a day. Abdominal set with straight leg raise  1. Lie on your back on a firm surface. 2. Bend one of your knees and keep your other leg straight. 3. Tense your abdominal muscles and lift your straight leg up, 4-6 inches (10-15 cm) off the ground. 4. Keep your abdominal muscles tight and hold this position for __________ seconds. ? Do not hold your breath. ? Do not arch your back. Keep it flat against the ground. 5. Keep your abdominal muscles tense as you slowly lower your leg back to the starting position. 6. Repeat with your other leg. Repeat __________ times. Complete this exercise __________ times a day. Single leg lower with bent knees 1. Lie on your back on a firm surface. 2. Tense your abdominal muscles and lift your feet off the floor, one foot at a time, so your knees and hips are bent in 90-degree angles (right angles). ? Your knees should be over your hips and your lower legs should be parallel to the floor. 3. Keeping your abdominal muscles tense and your knee bent, slowly lower one of your legs so your toe touches the ground. 4. Lift your leg back up to return to the starting position. ? Do not hold your breath. ? Do not let your back arch. Keep your back flat against the ground. 5. Repeat with your other leg. Repeat __________ times. Complete this exercise __________ times a day. Posture and body mechanics Good posture and healthy body mechanics can help to relieve stress in your body's tissues and joints. Body mechanics refers to the movements and  positions of your body while you do your daily activities. Posture is part of body mechanics. Good posture means:  Your spine is in its natural S-curve position (neutral).  Your shoulders are pulled back slightly.  Your head is not tipped forward. Follow these guidelines to improve your posture and body mechanics in your everyday activities. Standing   When standing, keep your spine neutral and your feet about hip width apart. Keep a slight bend in your knees. Your ears, shoulders, and hips should line up.  When you do a task in which you stand in one place for a long time, place one foot up on a stable object that is 2-4 inches (5-10 cm) high, such as a footstool. This helps keep your spine neutral. Sitting   When sitting, keep your spine neutral and keep your feet flat on the floor. Use a footrest, if necessary, and keep your thighs parallel to  the floor. Avoid rounding your shoulders, and avoid tilting your head forward.  When working at a desk or a computer, keep your desk at a height where your hands are slightly lower than your elbows. Slide your chair under your desk so you are close enough to maintain good posture.  When working at a computer, place your monitor at a height where you are looking straight ahead and you do not have to tilt your head forward or downward to look at the screen. Resting  When lying down and resting, avoid positions that are most painful for you.  If you have pain with activities such as sitting, bending, stooping, or squatting, lie in a position in which your body does not bend very much. For example, avoid curling up on your side with your arms and knees near your chest (fetal position).  If you have pain with activities such as standing for a long time or reaching with your arms, lie with your spine in a neutral position and bend your knees slightly. Try the following positions: ? Lying on your side with a pillow between your knees. ? Lying on your  back with a pillow under your knees. Lifting   When lifting objects, keep your feet at least shoulder width apart and tighten your abdominal muscles.  Bend your knees and hips and keep your spine neutral. It is important to lift using the strength of your legs, not your back. Do not lock your knees straight out.  Always ask for help to lift heavy or awkward objects. This information is not intended to replace advice given to you by your health care provider. Make sure you discuss any questions you have with your health care provider. Document Revised: 11/23/2018 Document Reviewed: 08/23/2018 Elsevier Patient Education  [place].

## 2019-10-29 NOTE — Progress Notes (Signed)
Albert Hogan - 49 y.o. male MRN [id number]  Date of birth: [birth year]  Subjective Chief Complaint  Patient presents with  . Follow-up    HPI Albert Hogan is a 49 y.o. male with history of HTN, HLD, depression with anxiety and OA of the knees here today for initial visit with me and routine follow up.    -HTN:  Current management with olmesartan/hctz at 40/25mg .  He is doing well with this and BP is well controlled today.  He had some lower extremity edema at recent appt with Dr. Dianah Field.   He reports that this comes and goes, worse towards the end of the day.  He denies chest pain, palpitations, shortness of breath, or fatigue.    -HLD:  He is taking atorvastatin 20mg  daily for cholesterol.  He denies myalgias with this.  He is due to have updated labs.   -Depression with anxiety: He has discontinued sertraline.  Symptoms stable at this time.   -OA:  He is seeing Dr. Dianah Field for management of of this.  Last visit was 10/25/2019, had orthovisc #4.  Reports that he is he has had some relief with this.    -Back pain: Prior history of DDD of lumbar spine.  Had low back pain with sciatica a couple of years ago, improved with PT and injection.  Reports that recently back has been tight with spasm.  He has not had radiation of pain, numbness or weakness into extremities.    ROS:  A comprehensive ROS was completed and negative except as noted per HPI  Allergies  Allergen Reactions  . Shellfish Allergy Anaphylaxis    Past Medical History:  Diagnosis Date  . Anxiety 01/26/2017  . Depression, major, single episode, complete remission ([place]) 02/01/2018  . ED (erectile dysfunction)   . Hypertension   . Morbid obesity ([place]) 04/21/2017    Past Surgical History:  Procedure Laterality Date  . APPENDECTOMY  2001  . HERNIA REPAIR    . KNEE SURGERY Right 11/27/2015   Partial medial and lateral meniscetomy and microfracture lateral femoral condyle. Dr Minda Meo [facility] [place]    Social History   Socioeconomic History  . Marital status: Single    Spouse name: Not on file  . Number of children: Not on file  . Years of education: Not on file  . Highest education level: Not on file  Occupational History  . Not on file  Tobacco Use  . Smoking status: Never Smoker  . Smokeless tobacco: Never Used  Substance and Sexual Activity  . Alcohol use: Not on file  . Drug use: No  . Sexual activity: Not on file  Other Topics Concern  . Not on file  Social History Narrative  . Not on file   Social Determinants of Health   Financial Resource Strain:   . Difficulty of Paying Living Expenses:   Food Insecurity:   . Worried About Charity fundraiser in the Last Year:   . Arboriculturist in the Last Year:   Transportation Needs:   . Film/video editor (Medical):   Marland Kitchen Lack of Transportation (Non-Medical):   Physical Activity:   . Days of Exercise per Week:   . Minutes of Exercise per Session:   Stress:   . Feeling of Stress :   Social Connections:   . Frequency of Communication with Friends and Family:   . Frequency of Social Gatherings with Friends and Family:   . Attends Religious  Services:   . Active Member of Clubs or Organizations:   . Attends Archivist Meetings:   Marland Kitchen Marital Status:     Family History  Problem Relation Age of Onset  . Hypertension Mother   . Thyroid disease Mother   . Hypertension Father     Health Maintenance  Topic Date Due  . INFLUENZA VACCINE  11/13/2019 (Originally 03/16/2019)  . TETANUS/TDAP  08/15/2021  . HIV Screening  Completed     ----------------------------------------------------------------------------------------------------------------------------------------------------------------------------------------------------------------- Physical Exam BP 135/84   Pulse 85   Temp 98.1 F (36.7 C) (Oral)   Ht 6' (1.829 m)   Wt 272 lb (123.4 kg)   BMI 36.89 kg/m   Physical  Exam Constitutional:      Appearance: Normal appearance.  HENT:     Head: Normocephalic and atraumatic.  Eyes:     General: No scleral icterus. Cardiovascular:     Rate and Rhythm: Normal rate and regular rhythm.  Pulmonary:     Effort: Pulmonary effort is normal.     Breath sounds: Normal breath sounds.  Musculoskeletal:     Cervical back: Neck supple.  Skin:    General: Skin is warm and dry.  Neurological:     Mental Status: He is alert.  Psychiatric:        Mood and Affect: Mood normal.        Behavior: Behavior normal.     ------------------------------------------------------------------------------------------------------------------------------------------------------------------------------------------------------------------- Assessment and Plan  Essential hypertension, benign Blood pressure is at goal at for age and co-morbidities.  I recommend he continue olmesartan/hctz at current strength.  No significant edema noted on exam today.  Transient edema likely related to venous insufficiency. He does have some compression stockings at home.  In addition they were instructed to follow a low sodium diet with regular exercise to help to maintain adequate control of blood pressure.    Hyperlipidemia Tolerating lipitor well, continue at this time.  Updated lipid panel ordered  Degenerative disc disease, lumbar Having some increased pain in lumbar spine with spasm.  Add medrol dosepak and flexeril.  Has HEP from previous PT sessions.  If not improving will refer back to formal PT.    Primary osteoarthritis of both knees Seeing Dr. Dianah Field for this, recently completed orthovisc series.   Depression, major, single episode, complete remission (H[facility]) Stable off of sertraline.    Meds ordered this encounter  Medications  . methylPREDNISolone (MEDROL) 4 MG TBPK tablet    Sig: Taper as directed on packaging    Dispense:  21 tablet    Refill:  0  . cyclobenzaprine  (FLEXERIL) 10 MG tablet    Sig: Take 1 tablet (10 mg total) by mouth 3 (three) times daily as needed for muscle spasms.    Dispense:  30 tablet    Refill:  0    No follow-ups on file.    This visit occurred during the SARS-CoV-2 public health emergency.  Safety protocols were in place, including screening questions prior to the visit, additional usage of staff PPE, and extensive cleaning of exam room while observing appropriate contact time as indicated for disinfecting solutions.

## 2019-10-29 NOTE — Assessment & Plan Note (Signed)
Having some increased pain in lumbar spine with spasm.  Add medrol dosepak and flexeril.  Has HEP from previous PT sessions.  If not improving will refer back to formal PT.

## 2019-10-29 NOTE — Assessment & Plan Note (Signed)
Tolerating lipitor well, continue at this time.  Updated lipid panel ordered

## 2019-10-29 NOTE — Assessment & Plan Note (Signed)
Blood pressure is at goal at for age and co-morbidities.  I recommend he continue olmesartan/hctz at current strength.  No significant edema noted on exam today.  Transient edema likely related to venous insufficiency. He does have some compression stockings at home.  In addition they were instructed to follow a low sodium diet with regular exercise to help to maintain adequate control of blood pressure.

## 2019-11-13 ENCOUNTER — Other Ambulatory Visit: Payer: Self-pay | Admitting: Sports Medicine

## 2019-11-25 ENCOUNTER — Ambulatory Visit: Payer: No Typology Code available for payment source | Admitting: Sports Medicine

## 2020-01-14 ENCOUNTER — Ambulatory Visit: Payer: No Typology Code available for payment source | Admitting: Sports Medicine

## 2020-01-20 ENCOUNTER — Ambulatory Visit (INDEPENDENT_AMBULATORY_CARE_PROVIDER_SITE_OTHER): Payer: No Typology Code available for payment source | Admitting: Sports Medicine

## 2020-01-20 DIAGNOSIS — M170 Bilateral primary osteoarthritis of knee: Secondary | ICD-10-CM

## 2020-01-20 MED ORDER — TRAMADOL HCL 50 MG PO TABS: 50.0000 mg | ORAL_TABLET | Freq: Every day | ORAL | 3 refills | Status: DC | PRN

## 2020-01-20 NOTE — Assessment & Plan Note (Signed)
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 We have been treating him for knee osteoarthritis, we finished a series of Orthovisc in his left knee in March, unfortunately he did not respond well, he tends to respond better to the regular steroid injections. He has had tramadol in the past, and this is worked well at approximately 1 pill daily. I aspirated and injected both knees today, tramadol for continuing pain relief, return as needed.

## 2020-01-20 NOTE — Progress Notes (Signed)
    Procedures performed today:    Procedure: Real-time Ultrasound Guided aspiration/injection of right knee Device: Samsung HS60  Verbal informed consent obtained.  Time-out conducted.  Noted no overlying erythema, induration, or other signs of local infection.  Skin prepped in a sterile fashion.  Local anesthesia: Topical Ethyl chloride.  With sterile technique and under real time ultrasound guidance:  Using 18-gauge needle advanced into the right knee and aspirated 45 cc of clear, straw-colored fluid, syringe switched and 1 cc Kenalog 40, 2 cc lidocaine, 2 cc bupivacaine injected easily Completed without difficulty  Pain immediately resolved suggesting accurate placement of the medication.  Advised to call if fevers/chills, erythema, induration, drainage, or persistent bleeding.  Images permanently stored and available for review in the ultrasound unit.  Impression: Technically successful ultrasound guided injection.  Procedure: Real-time Ultrasound Guided aspiration/injection of left knee Device: Samsung HS60  Verbal informed consent obtained.  Time-out conducted.  Noted no overlying erythema, induration, or other signs of local infection.  Skin prepped in a sterile fashion.  Local anesthesia: Topical Ethyl chloride.  With sterile technique and under real time ultrasound guidance:  Using 18-gauge needle advanced into the right knee and aspirated 19 cc of clear, straw-colored fluid, syringe switched and 1 cc Kenalog 40, 2 cc lidocaine, 2 cc bupivacaine injected easily Completed without difficulty  Pain immediately resolved suggesting accurate placement of the medication.  Advised to call if fevers/chills, erythema, induration, drainage, or persistent bleeding.  Images permanently stored and available for review in the ultrasound unit.  Impression: Technically successful ultrasound guided injection.  Independent interpretation of notes and tests performed by another provider:    None.  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 He has had tramadol in the past, and this is worked well at approximately 1 pill daily. I aspirated and injected both knees today, tramadol for continuing pain relief, return as needed.    ___________________________________________ Gwen Her. Dianah Field, M.D., ABFM., CAQSM. Primary Care and [place] Instructor of [place] of [facility] of Medicine

## 2020-02-26 LAB — LIPID PANEL
Cholesterol: 140 mg/dL (ref <200)
HDL: 49 mg/dL (ref >=40)
LDL Cholesterol (Calc): 72 mg/dL (calc)
Non-HDL Cholesterol (Calc): 91 mg/dL (calc) (ref <130)
Total CHOL/HDL Ratio: 2.9 (calc) (ref <5.0)
Triglycerides: 102 mg/dL (ref <150)

## 2020-02-26 LAB — COMPLETE METABOLIC PANEL WITH GFR
AG Ratio: 1.7 (calc) (ref 1.0–2.5)
ALT: 19 U/L (ref 9–46)
AST: 22 U/L (ref 10–40)
Albumin: 4.3 g/dL (ref 3.6–5.1)
Alkaline phosphatase (APISO): 68 U/L (ref 36–130)
BUN/Creatinine Ratio: 18 (calc) (ref 6–22)
BUN: 25 mg/dL (ref 7–25)
CO2: 30 mmol/L (ref 20–32)
Calcium: 9.4 mg/dL (ref 8.6–10.3)
Chloride: 102 mmol/L (ref 98–110)
Creat: 1.37 mg/dL — ABNORMAL HIGH (ref 0.60–1.35)
GFR, Est African American: 70 mL/min/{1.73_m2} (ref >=60)
GFR, Est Non African American: 61 mL/min/{1.73_m2} (ref >=60)
Globulin: 2.6 g/dL (calc) (ref 1.9–3.7)
Glucose, Bld: 98 mg/dL (ref 65–99)
Potassium: 3.8 mmol/L (ref 3.5–5.3)
Sodium: 140 mmol/L (ref 135–146)
Total Bilirubin: 0.5 mg/dL (ref 0.2–1.2)
Total Protein: 6.9 g/dL (ref 6.1–8.1)

## 2020-02-26 LAB — CBC
HCT: 41.1 % (ref 38.5–50.0)
Hemoglobin: 13.9 g/dL (ref 13.2–17.1)
MCH: 30.2 pg (ref 27.0–33.0)
MCHC: 33.8 g/dL (ref 32.0–36.0)
MCV: 89.2 fL (ref 80.0–100.0)
MPV: 12.3 fL (ref 7.5–12.5)
Platelets: 233 10*3/uL (ref 140–400)
RBC: 4.61 10*6/uL (ref 4.20–5.80)
RDW: 14 % (ref 11.0–15.0)
WBC: 6.7 10*3/uL (ref 3.8–10.8)

## 2020-02-26 LAB — TSH: TSH: 1.2 mIU/L (ref 0.40–4.50)

## 2020-02-28 ENCOUNTER — Other Ambulatory Visit: Payer: Self-pay | Admitting: Family Medicine

## 2020-03-12 ENCOUNTER — Other Ambulatory Visit: Payer: Self-pay | Admitting: *Deleted

## 2020-03-12 ENCOUNTER — Other Ambulatory Visit: Payer: Self-pay | Admitting: Family Medicine

## 2020-04-08 ENCOUNTER — Other Ambulatory Visit: Payer: Self-pay | Admitting: Sports Medicine

## 2020-04-08 DIAGNOSIS — M170 Bilateral primary osteoarthritis of knee: Secondary | ICD-10-CM

## 2020-04-08 NOTE — Telephone Encounter (Signed)
Last written 01/20/2020 #30 with 3 refills Last appt 01/20/2020

## 2020-04-09 ENCOUNTER — Other Ambulatory Visit: Payer: Self-pay | Admitting: Sports Medicine

## 2020-04-09 NOTE — Telephone Encounter (Signed)
Last written 03/12/2020 #6 no refills Last appt 10/29/2019 (sports med visit with Dr. Darene Lamer on 01/20/2020) Please advise

## 2020-04-28 ENCOUNTER — Ambulatory Visit (INDEPENDENT_AMBULATORY_CARE_PROVIDER_SITE_OTHER): Payer: No Typology Code available for payment source | Admitting: Sports Medicine

## 2020-04-28 ENCOUNTER — Other Ambulatory Visit: Payer: Self-pay | Admitting: Sports Medicine

## 2020-04-28 ENCOUNTER — Other Ambulatory Visit: Payer: Self-pay

## 2020-04-28 ENCOUNTER — Ambulatory Visit (INDEPENDENT_AMBULATORY_CARE_PROVIDER_SITE_OTHER): Payer: No Typology Code available for payment source

## 2020-04-28 DIAGNOSIS — M170 Bilateral primary osteoarthritis of knee: Secondary | ICD-10-CM

## 2020-04-28 MED ORDER — TRAMADOL HCL 50 MG PO TABS: 50.0000 mg | ORAL_TABLET | Freq: Two times a day (BID) | ORAL | 3 refills | Status: DC | PRN

## 2020-04-28 NOTE — Progress Notes (Signed)
° ° °  Procedures performed today:    Procedure: Real-time Ultrasound Guided  aspiration/injection of right knee Device: Samsung HS60  Verbal informed consent obtained.  Time-out conducted.  Noted no overlying erythema, induration, or other signs of local infection.  Skin prepped in a sterile fashion.  Local anesthesia: Topical Ethyl chloride.  With sterile technique and under real time ultrasound guidance:  Using 18-gauge needle aspirated 65 mL of clear, straw-colored fluid, syringe switched and 1 cc Kenalog 40, 2 cc lidocaine, 2 cc bupivacaine injected easily Completed without difficulty  Pain immediately resolved suggesting accurate placement of the medication.  Advised to call if fevers/chills, erythema, induration, drainage, or persistent bleeding.  Images permanently stored and available for review in PACS.  Impression: Technically successful ultrasound guided injection.  Procedure: Real-time Ultrasound Guided injection of the left knee Device: Samsung HS60  Verbal informed consent obtained.  Time-out conducted.  Noted no overlying erythema, induration, or other signs of local infection.  Skin prepped in a sterile fashion.  Local anesthesia: Topical Ethyl chloride.  With sterile technique and under real time ultrasound guidance: 1 cc Kenalog 40, 2 cc lidocaine, 2 cc bupivacaine injected easily Completed without difficulty  Pain immediately resolved suggesting accurate placement of the medication.  Advised to call if fevers/chills, erythema, induration, drainage, or persistent bleeding.  Images permanently stored and available for review in PACS.  Impression: Technically successful ultrasound guided injection.  ___________________________________________ Gwen Her. Dianah Field, M.D., ABFM., CAQSM. Primary Care and [place] Instructor of [place] of [facility] of Medicine  Independent  interpretation of notes and tests performed by another provider:   None.  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 He did respond better to steroid injections, the last of which was 3-1/2 months ago, repeated today, aspiration on the right with injection, simple injection on the left. He will increase his tramadol to twice daily. We discussed other modalities including PRP. If he has a recurrence of pain within 3 months we will get MRIs of both knees in the hopes of finding a target for arthroscopy. If no target found we will proceed with leukocyte poor PRP injections. If there is a target for arthroscopy he will have a knee scope and then we can proceed with leukocyte poor PRP.    ___________________________________________ Gwen Her. Dianah Field, M.D., ABFM., CAQSM. Primary Care and [place] Instructor of [place] of [facility] of Medicine

## 2020-04-28 NOTE — Assessment & Plan Note (Signed)
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 He did respond better to steroid injections, the last of which was 3-1/2 months ago, repeated today, aspiration on the right with injection, simple injection on the left. He will increase his tramadol to twice daily. We discussed other modalities including PRP. If he has a recurrence of pain within 3 months we will get MRIs of both knees in the hopes of finding a target for arthroscopy. If no target found we will proceed with leukocyte poor PRP injections. If there is a target for arthroscopy he will have a knee scope and then we can proceed with leukocyte poor PRP.

## 2020-06-02 IMAGING — MR MR KNEE*L* W/O CM
7 series · 40 of 40 positions shown · non-contrast
Comparison: Radiographs 04/12/2018

CLINICAL DATA: Knee pain swelling on and off for 1.5 years.

EXAM:
MRI OF THE LEFT KNEE WITHOUT CONTRAST
TECHNIQUE: Multiplanar, multisequence MR imaging of the knee was performed. No
intravenous contrast was administered.

[Series 3: T2 fat-sat · axial · 4.0mm · 0.53mm/px · z∈[-70,+100]mm · 7 of 35 slices shown (1 of 3)]
[im 1/35]
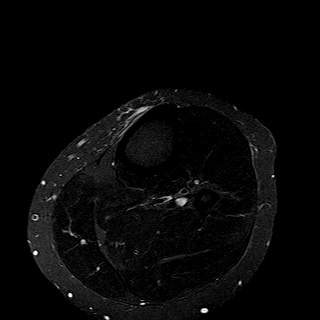
[im 6/35]
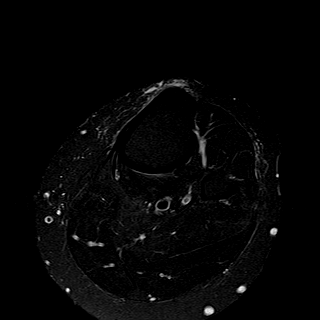
[im 12/35]
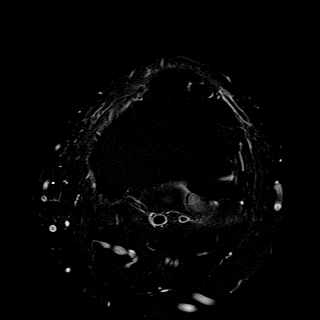
[im 18/35]
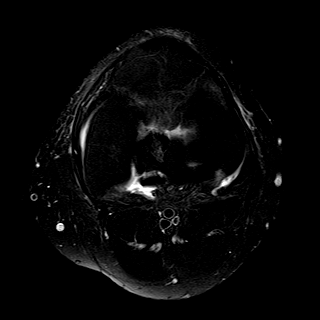
[im 23/35]
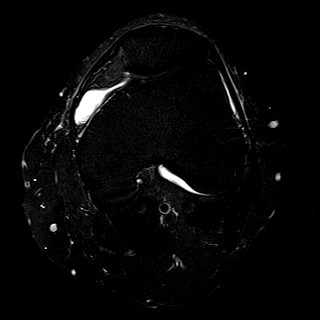
[im 29/35]
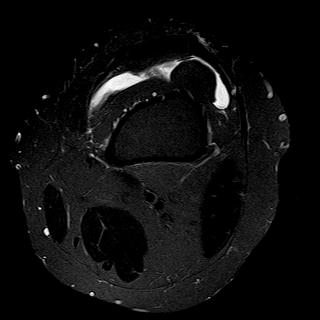
[im 35/35]
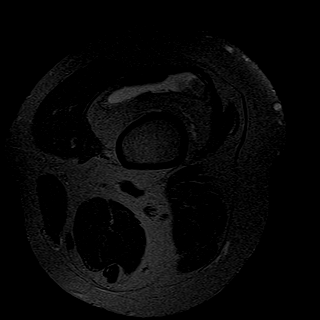

[Series 4: T1 · coronal · 4.0mm · 0.62mm/px · 6 of 33 slices shown]
[im 1/33]
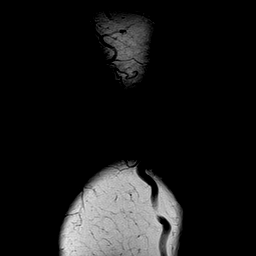
[im 7/33]
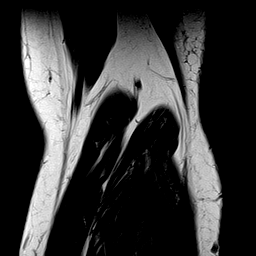
[im 13/33]
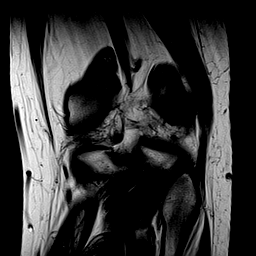
[im 20/33]
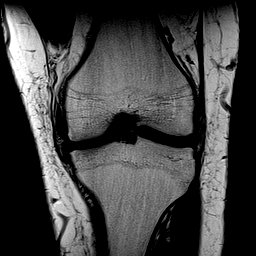
[im 26/33]
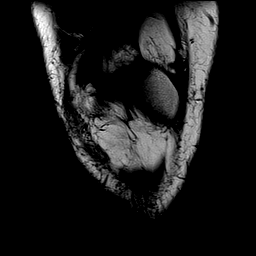
[im 33/33]
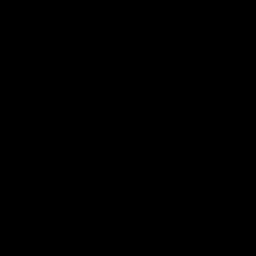

[Series 5: T2 fat-sat · coronal · 4.0mm · 0.62mm/px · 6 of 33 slices shown (2 of 3)]
[im 1/33]
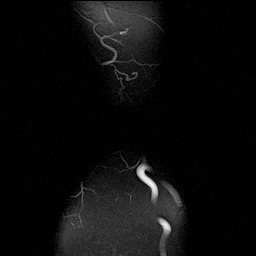
[im 7/33]
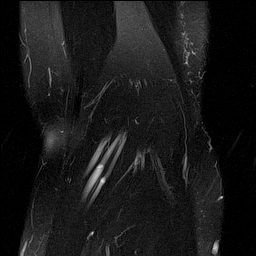
[im 13/33]
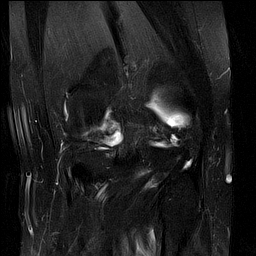
[im 20/33]
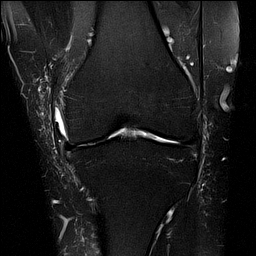
[im 26/33]
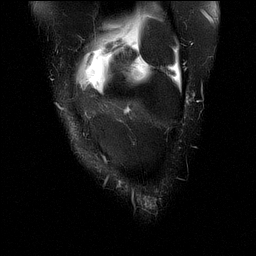
[im 33/33]
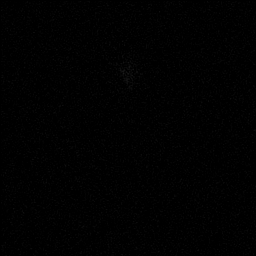

[Series 6: PD fat-sat · coronal · 4.0mm · 0.62mm/px · 6 of 33 slices shown (1 of 3)]
[im 1/33]
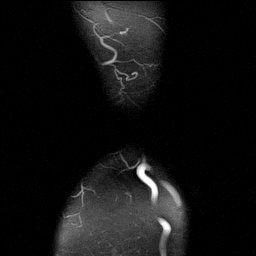
[im 7/33]
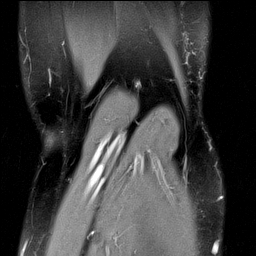
[im 13/33]
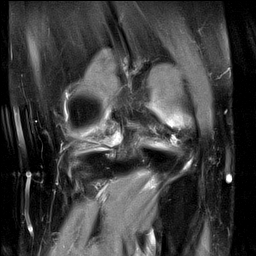
[im 20/33]
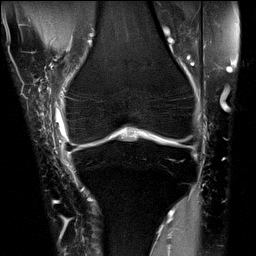
[im 26/33]
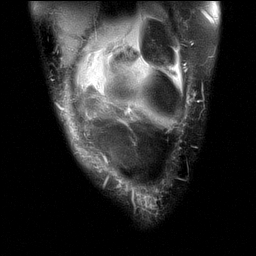
[im 33/33]
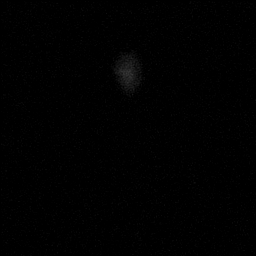

[Series 7: PD fat-sat · sagittal · 3.0mm · 0.66mm/px · 6 of 34 slices shown (2 of 3)]
[im 1/34]
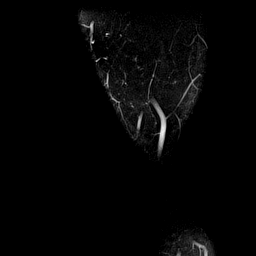
[im 7/34]
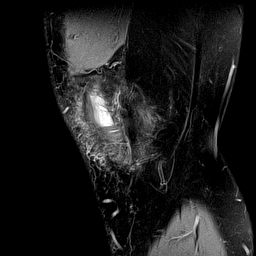
[im 14/34]
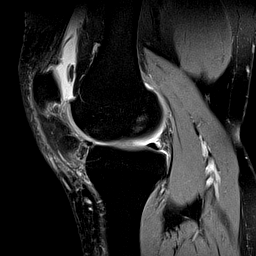
[im 20/34]
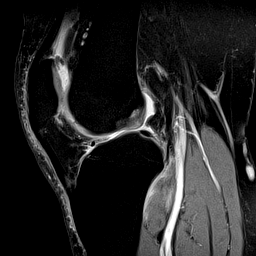
[im 27/34]
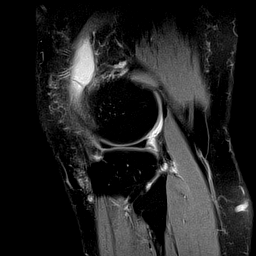
[im 34/34]
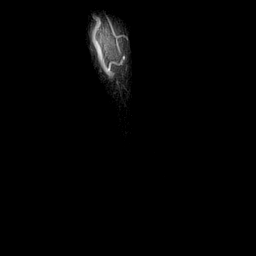

[Series 8: T2 fat-sat · sagittal · 3.0mm · 0.66mm/px · 6 of 35 slices shown (3 of 3)]
[im 1/35]
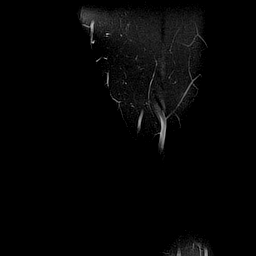
[im 7/35]
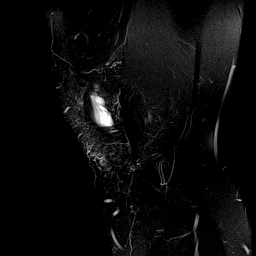
[im 14/35]
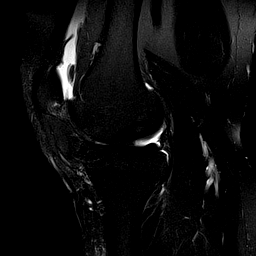
[im 21/35]
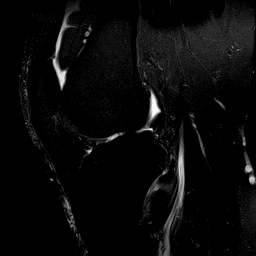
[im 28/35]
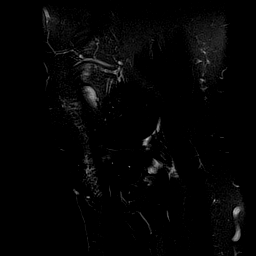
[im 35/35]
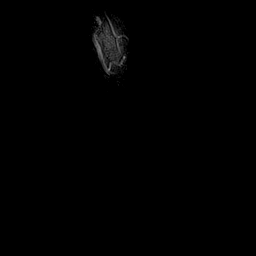

[Series 9: PD fat-sat · oblique · 2.0mm · 0.62mm/px · 3 of 19 slices shown (3 of 3)]
[im 1/19]
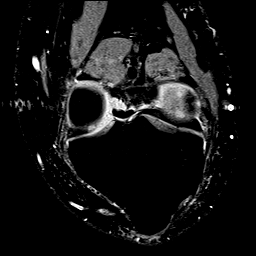
[im 10/19]
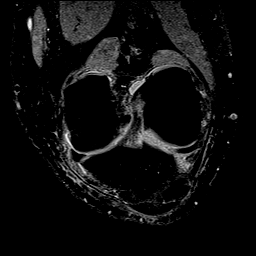
[im 19/19]
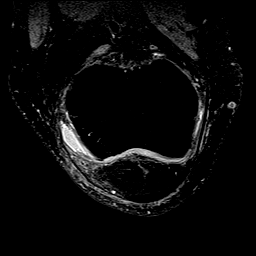

[40 of 40 positions shown; findings below may reference images not displayed]

FINDINGS: MENISCI

Medial meniscus:  Intact.

Lateral meniscus:  Intact

LIGAMENTS

Cruciates:  Intact

Collaterals:  Intact

CARTILAGE

Patellofemoral:  Minimal degenerative chondrosis.

Medial: Moderate age advanced degenerative chondrosis with areas of
significant cartilage thinning, fraying and fibrillation mainly
involving the femoral articular cartilage. No discrete
full-thickness defect or osteochondral lesion.

Lateral:  Mild degenerative chondrosis.

Joint: Moderate-sized joint effusion. There is also globular
appearing fat in the suprapatellar bursa region. Hoffa's fat is also
somewhat prominent and mildly inflamed. Findings could suggest
lipoma arborescens.

Popliteal Fossa:  No popliteal mass or Baker's cyst.

Extensor Mechanism: The patella retinacular structures are intact
and the quadriceps and patellar tendons are intact. Mild distal
patellar tendinopathy.

Bones: No acute bony findings. No bone contusion, marrow edema or
osteochondral abnormality.

Other: Some fluid tracking back along the popliteus tendon. Proximal
popliteus musculotendinous sprain is possible. The tendon is intact.
Normal knee musculature.
IMPRESSION: 1. Intact ligamentous structures and no acute bony findings.
2. No meniscal tears.
3. Moderate age advanced degenerative chondrosis involving the
medial compartment.
4. Moderate-sized joint effusion and findings suspicious for lipoma
arborescens.
5. Possible proximal popliteus musculotendinous sprain.

## 2020-06-04 ENCOUNTER — Other Ambulatory Visit: Payer: Self-pay | Admitting: Sports Medicine

## 2020-06-04 ENCOUNTER — Other Ambulatory Visit: Payer: Self-pay | Admitting: Family Medicine

## 2020-08-06 ENCOUNTER — Ambulatory Visit (INDEPENDENT_AMBULATORY_CARE_PROVIDER_SITE_OTHER): Payer: No Typology Code available for payment source

## 2020-08-06 ENCOUNTER — Other Ambulatory Visit: Payer: Self-pay

## 2020-08-06 ENCOUNTER — Ambulatory Visit (INDEPENDENT_AMBULATORY_CARE_PROVIDER_SITE_OTHER): Payer: No Typology Code available for payment source | Admitting: Sports Medicine

## 2020-08-06 DIAGNOSIS — M170 Bilateral primary osteoarthritis of knee: Secondary | ICD-10-CM | POA: Diagnosis not present

## 2020-08-06 NOTE — Progress Notes (Signed)
    Procedures performed today:    Procedure: Real-time Ultrasound Guided injection of the left knee Device: Samsung HS60  Verbal informed consent obtained.  Time-out conducted.  Noted no overlying erythema, induration, or other signs of local infection.  Skin prepped in a sterile fashion.  Local anesthesia: Topical Ethyl chloride.  With sterile technique and under real time ultrasound guidance: 1 cc Kenalog 40, 2 cc lidocaine, 2 cc bupivacaine injected easily Completed without difficulty  Advised to call if fevers/chills, erythema, induration, drainage, or persistent bleeding.  Images permanently stored and available for review in PACS.  Impression: Technically successful ultrasound guided injection.  Procedure: Real-time Ultrasound Guided injection of the right knee Device: Samsung HS60  Verbal informed consent obtained.  Time-out conducted.  Noted no overlying erythema, induration, or other signs of local infection.  Skin prepped in a sterile fashion.  Local anesthesia: Topical Ethyl chloride.  With sterile technique and under real time ultrasound guidance: 1 cc Kenalog 40, 2 cc lidocaine, 2 cc bupivacaine injected easily Completed without difficulty  Advised to call if fevers/chills, erythema, induration, drainage, or persistent bleeding.  Images permanently stored and available for review in PACS.  Impression: Technically successful ultrasound guided injection.  Independent interpretation of notes and tests performed by another provider:   None.  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 Repeated today, continue tramadol, return to see me as  needed.     ___________________________________________ Gwen Her. Dianah Field, M.D., ABFM., CAQSM. Primary Care and [place] Instructor of [place] of [facility] of Medicine

## 2020-08-06 NOTE — Assessment & Plan Note (Signed)
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 Repeated today, continue tramadol, return to see me as needed.

## 2020-08-12 ENCOUNTER — Ambulatory Visit: Payer: No Typology Code available for payment source | Admitting: Sports Medicine

## 2020-08-25 ENCOUNTER — Ambulatory Visit (INDEPENDENT_AMBULATORY_CARE_PROVIDER_SITE_OTHER): Payer: No Typology Code available for payment source | Admitting: Family Medicine

## 2020-08-25 ENCOUNTER — Other Ambulatory Visit: Payer: Self-pay

## 2020-08-25 ENCOUNTER — Encounter: Payer: Self-pay | Admitting: Family Medicine

## 2020-08-25 DIAGNOSIS — S39012A Strain of muscle, fascia and tendon of lower back, initial encounter: Secondary | ICD-10-CM

## 2020-08-25 MED ORDER — CYCLOBENZAPRINE HCL 10 MG PO TABS: 10.0000 mg | ORAL_TABLET | Freq: Three times a day (TID) | ORAL | 0 refills | Status: DC | PRN

## 2020-08-25 NOTE — Progress Notes (Signed)
Albert Hogan - 50 y.o. male MRN [id number]  Date of birth: December 03, 1970  Subjective No chief complaint on file.   HPI Albert Hogan is a 50 y.o. male here today with complaint of low back pain.  Current episode started a few days ago.  Lifting golf clubs from trunk and felt tightness and catching sensation in his back. Not as severe as previous episode.   He has history of DDD and had sciatica with previous back pain.  Denies radiation of pain at this time, weakness, numbness or tingling.  He has tried ibuprofen and tramadol with some relief.    ROS:  A comprehensive ROS was completed and negative except as noted per HPI  Allergies  Allergen Reactions  . Shellfish Allergy Anaphylaxis    Past Medical History:  Diagnosis Date  . Anxiety 01/26/2017  . 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 ED (erectile dysfunction)   . Hypertension   . Morbid obesity ([place]) 04/21/2017    Past Surgical History:  Procedure Laterality Date  . APPENDECTOMY  2001  . HERNIA REPAIR    . KNEE SURGERY Right 11/27/2015   Partial medial and lateral meniscetomy and microfracture lateral femoral condyle. Dr Minda Meo [facility] [place]    Social History   Socioeconomic History  . Marital status: Single    Spouse name: Not on file  . Number of children: Not on file  . Years of education: Not on file  . Highest education level: Not on file  Occupational History  . Not on file  Tobacco Use  . Smoking status: Never Smoker  . Smokeless tobacco: Never Used  Vaping Use  . Vaping Use: Never used  Substance and Sexual Activity  . Alcohol use: Not on file  . Drug use: No  . Sexual activity: Not on file  Other Topics Concern  . Not on file  Social History Narrative  . Not on file   Social Determinants of Health   Financial Resource Strain: Not on file  Food Insecurity: Not on file  Transportation Needs: Not on file  Physical Activity: Not on file   Stress: Not on file  Social Connections: Not on file    Family History  Problem Relation Age of Onset  . Hypertension Mother   . Thyroid disease Mother   . Hypertension Father     Health Maintenance  Topic Date Due  . COVID-19 Vaccine (1) Never done  . COLONOSCOPY (Pts 45-25yrs Insurance coverage will need to be confirmed)  Never done  . INFLUENZA VACCINE  03/15/2020  . TETANUS/TDAP  08/15/2021  . Hepatitis C Screening  Completed  . HIV Screening  Completed     ----------------------------------------------------------------------------------------------------------------------------------------------------------------------------------------------------------------- Physical Exam There were no vitals taken for this visit.  Physical Exam Constitutional:      Appearance: Normal appearance.  HENT:     Head: Normocephalic and atraumatic.  Eyes:     General: No scleral icterus. Musculoskeletal:     Cervical back: Neck supple.     Comments: Lumbar spine is non-tender, ROM is normal.  Hamstrings are tight.  SLR with mild pain on L. LE stregth is normal.   Neurological:     General: No focal deficit present.     Mental Status: He is alert.  Psychiatric:        Mood and Affect: Mood normal.        Behavior: Behavior normal.     ------------------------------------------------------------------------------------------------------------------------------------------------------------------------------------------------------------------- Assessment and Plan  Lumbar strain  Continue NSAID and tramadol as needed.  Will add flexeril back on.  Given handout for home back rehab program.  Instructed to contact clinic for f/u  if worsening or not improving.    Meds ordered this encounter  Medications  . cyclobenzaprine (FLEXERIL) 10 MG tablet    Sig: Take 1 tablet (10 mg total) by mouth 3 (three) times daily as needed for muscle spasms.    Dispense:  30 tablet    Refill:   0    No follow-ups on file.    This visit occurred during the SARS-CoV-2 public health emergency.  Safety protocols were in place, including screening questions prior to the visit, additional usage of staff PPE, and extensive cleaning of exam room while observing appropriate contact time as indicated for disinfecting solutions.

## 2020-08-25 NOTE — Assessment & Plan Note (Signed)
Continue NSAID and tramadol as needed.  Will add flexeril back on.  Given handout for home back rehab program.  Instructed to contact clinic for f/u  if worsening or not improving.

## 2020-08-26 ENCOUNTER — Ambulatory Visit: Payer: No Typology Code available for payment source | Admitting: Family Medicine

## 2020-08-27 ENCOUNTER — Ambulatory Visit: Payer: No Typology Code available for payment source | Admitting: Family Medicine

## 2020-09-15 ENCOUNTER — Other Ambulatory Visit: Payer: Self-pay | Admitting: Family Medicine

## 2020-09-15 ENCOUNTER — Other Ambulatory Visit: Payer: Self-pay | Admitting: Sports Medicine

## 2020-09-24 ENCOUNTER — Other Ambulatory Visit: Payer: Self-pay | Admitting: Family Medicine

## 2020-10-20 ENCOUNTER — Encounter: Payer: Self-pay | Admitting: Family Medicine

## 2020-10-26 ENCOUNTER — Other Ambulatory Visit: Payer: Self-pay | Admitting: Sports Medicine

## 2020-10-26 DIAGNOSIS — M170 Bilateral primary osteoarthritis of knee: Secondary | ICD-10-CM

## 2020-10-29 ENCOUNTER — Other Ambulatory Visit: Payer: Self-pay

## 2020-10-29 ENCOUNTER — Ambulatory Visit (INDEPENDENT_AMBULATORY_CARE_PROVIDER_SITE_OTHER): Payer: No Typology Code available for payment source | Admitting: Sports Medicine

## 2020-10-29 DIAGNOSIS — M170 Bilateral primary osteoarthritis of knee: Secondary | ICD-10-CM | POA: Diagnosis not present

## 2020-10-29 MED ORDER — PREDNISONE 50 MG PO TABS: ORAL_TABLET | ORAL | 0 refills | Status: DC

## 2020-10-29 MED ORDER — HYDROCODONE-ACETAMINOPHEN 5-325 MG PO TABS: 1.0000 | ORAL_TABLET | Freq: Three times a day (TID) | ORAL | 0 refills | Status: DC | PRN

## 2020-10-29 NOTE — Assessment & Plan Note (Signed)
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 His last injection was just under 3 months ago, unfortunately due to the recurrence of pain I do think he is now a candidate for bilateral total knee arthroplasty. Switching to hydrocodone, adding 5 days of prednisone, he understands that we cannot instrument the knee joint within 3 months of a knee replacement, he will start looking for a surgeon. We did discuss some of the soft factors of finding a surgeon such as close to home, local support, accessibility that he should be looking for. I wished him luck.

## 2020-10-29 NOTE — Progress Notes (Signed)
    Procedures performed today:    None.  Independent interpretation of notes and tests performed by another provider:   None.  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 His last injection was just under 3 months ago, unfortunately due to the recurrence of pain I do think he is now a candidate for bilateral total knee arthroplasty. Switching to hydrocodone, adding 5 days of prednisone, he understands that we cannot instrument the knee joint within 3 months of a knee replacement, he will start looking for a surgeon. We did discuss some of the soft factors of finding a surgeon such as close to home, local support, accessibility that he should be looking for. I wished him luck.    ___________________________________________ Gwen Her. Dianah Field, M.D., ABFM., CAQSM. Primary Care and [place] Instructor of [place] of [facility] of Medicine

## 2020-11-17 ENCOUNTER — Other Ambulatory Visit: Payer: Self-pay | Admitting: Family Medicine

## 2020-11-17 DIAGNOSIS — M170 Bilateral primary osteoarthritis of knee: Secondary | ICD-10-CM

## 2020-11-17 DIAGNOSIS — S83281A Other tear of lateral meniscus, current injury, right knee, initial encounter: Secondary | ICD-10-CM

## 2020-11-17 NOTE — Telephone Encounter (Signed)
I spent 5 total minutes of online digital evaluation and management services. 

## 2020-11-24 NOTE — Telephone Encounter (Signed)
Left vm with [employer] requesting a call back.

## 2020-12-02 NOTE — Telephone Encounter (Signed)
Patient has been scheduled with Dr T for in the morning. ANM

## 2020-12-03 ENCOUNTER — Ambulatory Visit (INDEPENDENT_AMBULATORY_CARE_PROVIDER_SITE_OTHER): Payer: No Typology Code available for payment source

## 2020-12-03 ENCOUNTER — Ambulatory Visit (INDEPENDENT_AMBULATORY_CARE_PROVIDER_SITE_OTHER): Payer: No Typology Code available for payment source | Admitting: Sports Medicine

## 2020-12-03 DIAGNOSIS — M170 Bilateral primary osteoarthritis of knee: Secondary | ICD-10-CM | POA: Diagnosis not present

## 2020-12-03 NOTE — Assessment & Plan Note (Signed)
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 He is having recurrence of pain, we are using occasional hydrocodone, last injection was in December, repeated today bilaterally. Return as needed. He does understand that we cannot instrument the knee within 6 to 12 weeks of a knee arthroplasty.

## 2020-12-03 NOTE — Progress Notes (Signed)
    Procedures performed today:    Procedure: Real-time Ultrasound Guided injection of the left knee Device: Samsung HS60  Verbal informed consent obtained.  Time-out conducted.  Noted no overlying erythema, induration, or other signs of local infection.  Skin prepped in a sterile fashion.  Local anesthesia: Topical Ethyl chloride.  With sterile technique and under real time ultrasound guidance:  Noted effusion, 1 cc Kenalog 40, 2 cc lidocaine, 2 cc bupivacaine injected easily Completed without difficulty  Advised to call if fevers/chills, erythema, induration, drainage, or persistent bleeding.  Images permanently stored and available for review in PACS.  Impression: Technically successful ultrasound guided injection.  Procedure: Real-time Ultrasound Guided injection of the right knee Device: Samsung HS60  Verbal informed consent obtained.  Time-out conducted.  Noted no overlying erythema, induration, or other signs of local infection.  Skin prepped in a sterile fashion.  Local anesthesia: Topical Ethyl chloride.  With sterile technique and under real time ultrasound guidance:  Noted effusion, 1 cc Kenalog 40, 2 cc lidocaine, 2 cc bupivacaine injected easily Completed without difficulty  Advised to call if fevers/chills, erythema, induration, drainage, or persistent bleeding.  Images permanently stored and available for review in PACS.  Impression: Technically successful ultrasound guided injection.  Independent interpretation of notes and tests performed by another provider:   None.  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 He is having recurrence of pain, we are using occasional hydrocodone, last injection was in December, repeated today bilaterally. Return as needed. He does understand that we cannot instrument the knee within 6 to 12 weeks of a knee arthroplasty.    ___________________________________________ Gwen Her. Dianah Field, M.D., ABFM., CAQSM. Primary Care and [place] Instructor of [place] of [facility] of Medicine

## 2020-12-23 ENCOUNTER — Other Ambulatory Visit: Payer: Self-pay | Admitting: Sports Medicine

## 2020-12-23 ENCOUNTER — Other Ambulatory Visit: Payer: Self-pay | Admitting: Family Medicine

## 2020-12-23 NOTE — Telephone Encounter (Signed)
To PCP

## 2021-01-13 ENCOUNTER — Encounter: Payer: Self-pay | Admitting: Family Medicine

## 2021-01-14 NOTE — Telephone Encounter (Signed)
Patient has been scheduled for annual physical with PCP. AM

## 2021-01-28 ENCOUNTER — Encounter: Payer: No Typology Code available for payment source | Admitting: Family Medicine

## 2021-02-03 ENCOUNTER — Encounter: Payer: Self-pay | Admitting: Family Medicine

## 2021-02-03 ENCOUNTER — Other Ambulatory Visit: Payer: Self-pay

## 2021-02-03 ENCOUNTER — Ambulatory Visit (INDEPENDENT_AMBULATORY_CARE_PROVIDER_SITE_OTHER): Payer: No Typology Code available for payment source | Admitting: Family Medicine

## 2021-02-03 VITALS — BP 100/69 | HR 83 | Ht 73.0 in | Wt 283.0 lb

## 2021-02-03 DIAGNOSIS — E782 Mixed hyperlipidemia: Secondary | ICD-10-CM | POA: Diagnosis not present

## 2021-02-03 DIAGNOSIS — Z1211 Encounter for screening for malignant neoplasm of colon: Secondary | ICD-10-CM

## 2021-02-03 DIAGNOSIS — I10 Essential (primary) hypertension: Secondary | ICD-10-CM

## 2021-02-03 DIAGNOSIS — Z0000 Encounter for general adult medical examination without abnormal findings: Secondary | ICD-10-CM

## 2021-02-03 LAB — CBC WITH DIFFERENTIAL/PLATELET
Absolute Monocytes: 472 cells/uL (ref 200–950)
Basophils Absolute: 30 cells/uL (ref 0–200)
Basophils Relative: 0.5 %
Eosinophils Absolute: 218 cells/uL (ref 15–500)
Eosinophils Relative: 3.7 %
HCT: 43.7 % (ref 38.5–50.0)
Hemoglobin: 14.4 g/dL (ref 13.2–17.1)
Lymphs Abs: 2065 cells/uL (ref 850–3900)
MCH: 29.5 pg (ref 27.0–33.0)
MCHC: 33 g/dL (ref 32.0–36.0)
MCV: 89.5 fL (ref 80.0–100.0)
MPV: 12.5 fL (ref 7.5–12.5)
Monocytes Relative: 8 %
Neutro Abs: 3115 cells/uL (ref 1500–7800)
Neutrophils Relative %: 52.8 %
Platelets: 204 10*3/uL (ref 140–400)
RBC: 4.88 10*6/uL (ref 4.20–5.80)
RDW: 13.8 % (ref 11.0–15.0)
Total Lymphocyte: 35 %
WBC: 5.9 10*3/uL (ref 3.8–10.8)

## 2021-02-03 LAB — LIPID PANEL W/REFLEX DIRECT LDL
Cholesterol: 149 mg/dL (ref <200)
HDL: 42 mg/dL (ref >=40)
LDL Cholesterol (Calc): 90 mg/dL (calc)
Non-HDL Cholesterol (Calc): 107 mg/dL (calc) (ref <130)
Total CHOL/HDL Ratio: 3.5 (calc) (ref <5.0)
Triglycerides: 79 mg/dL (ref <150)

## 2021-02-03 LAB — COMPLETE METABOLIC PANEL WITH GFR
AG Ratio: 1.8 (calc) (ref 1.0–2.5)
ALT: 21 U/L (ref 9–46)
AST: 26 U/L (ref 10–40)
Albumin: 4.4 g/dL (ref 3.6–5.1)
Alkaline phosphatase (APISO): 69 U/L (ref 36–130)
BUN/Creatinine Ratio: 21 (calc) (ref 6–22)
BUN: 27 mg/dL — ABNORMAL HIGH (ref 7–25)
CO2: 28 mmol/L (ref 20–32)
Calcium: 9.5 mg/dL (ref 8.6–10.3)
Chloride: 101 mmol/L (ref 98–110)
Creat: 1.26 mg/dL (ref 0.60–1.35)
GFR, Est African American: 77 mL/min/{1.73_m2} (ref >=60)
GFR, Est Non African American: 67 mL/min/{1.73_m2} (ref >=60)
Globulin: 2.5 g/dL (calc) (ref 1.9–3.7)
Glucose, Bld: 91 mg/dL (ref 65–99)
Potassium: 4.3 mmol/L (ref 3.5–5.3)
Sodium: 139 mmol/L (ref 135–146)
Total Bilirubin: 0.5 mg/dL (ref 0.2–1.2)
Total Protein: 6.9 g/dL (ref 6.1–8.1)

## 2021-02-03 MED ORDER — OLMESARTAN MEDOXOMIL-HCTZ 40-25 MG PO TABS: 1.0000 | ORAL_TABLET | Freq: Every day | ORAL | 2 refills | Status: DC

## 2021-02-03 MED ORDER — ATORVASTATIN CALCIUM 20 MG PO TABS: 1.0000 | ORAL_TABLET | Freq: Every day | ORAL | 2 refills | Status: DC

## 2021-02-03 NOTE — Assessment & Plan Note (Signed)
Well adult Orders Placed This Encounter  Procedures  . COMPLETE METABOLIC PANEL WITH GFR  . CBC with Differential  . Lipid Panel w/reflex Direct LDL  . Ambulatory referral to Gastroenterology    Referral Priority:   Routine    Referral Type:   Consultation    Referral Reason:   Specialty Services Required    Number of Visits Requested:   1  Screening:  Referral to GI for colon cancer screening Immunizations:  UTD Anticipatory guidance/Risk factor reduction:  Recommendations per AVS

## 2021-02-03 NOTE — Progress Notes (Signed)
Albert Hogan - 50 y.o. male MRN [id number]  Date of birth: Sep 18, 1970  Subjective Chief Complaint  Patient presents with   Annual Exam    HPI Albert Hogan is a 50 y.o. male here today for annual exam. He reports that he is doing well.  He has no new concerns today.    He does exercise regularly and feels like his diet is pretty good.   He is a non-smoker and rarely consume EtOH.    He has never had colon cancer screening.  Review of Systems  Constitutional:  Negative for chills, fever, malaise/fatigue and weight loss.  HENT:  Negative for congestion, ear pain and sore throat.   Eyes:  Negative for blurred vision, double vision and pain.  Respiratory:  Negative for cough and shortness of breath.   Cardiovascular:  Negative for chest pain and palpitations.  Gastrointestinal:  Negative for abdominal pain, blood in stool, constipation, heartburn and nausea.  Genitourinary:  Negative for dysuria and urgency.  Musculoskeletal:  Negative for joint pain and myalgias.  Neurological:  Negative for dizziness and headaches.  Endo/Heme/Allergies:  Does not bruise/bleed easily.  Psychiatric/Behavioral:  Negative for depression. The patient is not nervous/anxious and does not have insomnia.     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 Dr Minda Meo [facility] [place]    Social History   Socioeconomic History   Marital status: Single    Spouse name: Not on file   Number of children: Not on file   Years of education: Not on  file   Highest education level: Not on file  Occupational History   Not on file  Tobacco Use   Smoking status: Never   Smokeless tobacco: Never  Vaping Use   Vaping Use: Never used  Substance and Sexual Activity   Alcohol use: Not on file   Drug use: No   Sexual activity: Not on file  Other Topics Concern   Not on file  Social History Narrative   Not on file   Social Determinants of Health   Financial Resource Strain: Not on file  Food Insecurity: Not on file  Transportation Needs: Not on file  Physical Activity: Not on file  Stress: Not on file  Social Connections: Not on file    Family History  Problem Relation Age of Onset   Hypertension Mother    Thyroid disease Mother    Hypertension Father     Health Maintenance  Topic Date Due   COLONOSCOPY (Pts 45-73yrs Insurance coverage will need to be confirmed)  Never done   COVID-19 Vaccine (3 - Booster for [employer] series) 10/27/2020   INFLUENZA VACCINE  03/15/2021   TETANUS/TDAP  08/15/2021   Hepatitis C Screening  Completed   HIV Screening  Completed   Pneumococcal Vaccine 72-35 Years old  Aged Out   HPV VACCINES  Aged Out     ----------------------------------------------------------------------------------------------------------------------------------------------------------------------------------------------------------------- Physical Exam BP 100/69 (BP Location: Left Arm, Patient Position: Sitting, Cuff Size: Large)   Pulse 83   Ht 6'  1" (1.854 m)   Wt 283 lb (128.4 kg)   SpO2 99%   BMI 37.34 kg/m   Physical Exam Constitutional:      General: He is not in acute distress. HENT:     Head: Normocephalic and atraumatic.     Right Ear: Tympanic membrane and external ear normal.     Left Ear: Tympanic membrane and external ear normal.  Eyes:     General: No scleral icterus. Neck:     Thyroid: No thyromegaly.  Cardiovascular:     Rate and Rhythm: Normal rate and regular rhythm.     Heart sounds:  Normal heart sounds.  Pulmonary:     Effort: Pulmonary effort is normal.     Breath sounds: Normal breath sounds.  Abdominal:     General: Bowel sounds are normal. There is no distension.     Palpations: Abdomen is soft.     Tenderness: There is no abdominal tenderness. There is no guarding.  Musculoskeletal:     Cervical back: Normal range of motion.  Lymphadenopathy:     Cervical: No cervical adenopathy.  Skin:    General: Skin is warm and dry.     Findings: No rash.  Neurological:     Mental Status: He is alert and oriented to person, place, and time.     Cranial Nerves: No cranial nerve deficit.     Motor: No abnormal muscle tone.  Psychiatric:        Mood and Affect: Mood normal.        Behavior: Behavior normal.    ------------------------------------------------------------------------------------------------------------------------------------------------------------------------------------------------------------------- Assessment and Plan  Well adult exam Well adult Orders Placed This Encounter  Procedures   COMPLETE METABOLIC PANEL WITH GFR   CBC with Differential   Lipid Panel w/reflex Direct LDL   Ambulatory referral to Gastroenterology    Referral Priority:   Routine    Referral Type:   Consultation    Referral Reason:   Specialty Services Required    Number of Visits Requested:   1  Screening:  Referral to GI for colon cancer screening Immunizations:  UTD Anticipatory guidance/Risk factor reduction:  Recommendations per AVS   Meds ordered this encounter  Medications   atorvastatin (LIPITOR) 20 MG tablet    Sig: Take 1 tablet (20 mg total) by mouth daily.    Dispense:  90 tablet    Refill:  2   olmesartan-hydrochlorothiazide (BENICAR HCT) 40-25 MG tablet    Sig: Take 1 tablet by mouth daily.    Dispense:  90 tablet    Refill:  2    Return in about 6 months (around 08/05/2021) for HTN.    This visit occurred during the SARS-CoV-2 public health  emergency.  Safety protocols were in place, including screening questions prior to the visit, additional usage of staff PPE, and extensive cleaning of exam room while observing appropriate contact time as indicated for disinfecting solutions.

## 2021-02-03 NOTE — Patient Instructions (Signed)
Preventive Care 40-50 Years Old, Male Preventive care refers to lifestyle choices and visits with your health care provider that can promote health and wellness. This includes: A yearly physical exam. This is also called an annual wellness visit. Regular dental and eye exams. Immunizations. Screening for certain conditions. Healthy lifestyle choices, such as: Eating a healthy diet. Getting regular exercise. Not using drugs or products that contain nicotine and tobacco. Limiting alcohol use. What can I expect for my preventive care visit? Physical exam Your health care provider will check your: Height and weight. These may be used to calculate your BMI (body mass index). BMI is a measurement that tells if you are at a healthy weight. Heart rate and blood pressure. Body temperature. Skin for abnormal spots. Counseling Your health care provider may ask you questions about your: Past medical problems. Family's medical history. Alcohol, tobacco, and drug use. Emotional well-being. Home life and relationship well-being. Sexual activity. Diet, exercise, and sleep habits. Work and work environment. Access to firearms. What immunizations do I need?  Vaccines are usually given at various ages, according to a schedule. Your health care provider will recommend vaccines for you based on your age, medicalhistory, and lifestyle or other factors, such as travel or where you work. What tests do I need? Blood tests Lipid and cholesterol levels. These may be checked every 5 years, or more often if you are over 50 years old. Hepatitis C test. Hepatitis B test. Screening Lung cancer screening. You may have this screening every year starting at age 55 if you have a 30-pack-year history of smoking and currently smoke or have quit within the past 15 years. Prostate cancer screening. Recommendations will vary depending on your family history and other risks. Genital exam to check for testicular cancer  or hernias. Colorectal cancer screening. All adults should have this screening starting at age 50 and continuing until age 75. Your health care provider may recommend screening at age 45 if you are at increased risk. You will have tests every 1-10 years, depending on your results and the type of screening test. Diabetes screening. This is done by checking your blood sugar (glucose) after you have not eaten for a while (fasting). You may have this done every 1-3 years. STD (sexually transmitted disease) testing, if you are at risk. Follow these instructions at home: Eating and drinking  Eat a diet that includes fresh fruits and vegetables, whole grains, lean protein, and low-fat dairy products. Take vitamin and mineral supplements as recommended by your health care provider. Do not drink alcohol if your health care provider tells you not to drink. If you drink alcohol: Limit how much you have to 0-2 drinks a day. Be aware of how much alcohol is in your drink. In the [place] one drink equals one 12 oz bottle of beer (355 mL), one 5 oz glass of wine (148 mL), or one 1 oz glass of hard liquor (44 mL).  Lifestyle Take daily care of your teeth and gums. Brush your teeth every morning and night with fluoride toothpaste. Floss one time each day. Stay active. Exercise for at least 30 minutes 5 or more days each week. Do not use any products that contain nicotine or tobacco, such as cigarettes, e-cigarettes, and chewing tobacco. If you need help quitting, ask your health care provider. Do not use drugs. If you are sexually active, practice safe sex. Use a condom or other form of protection to prevent STIs (sexually transmitted infections). If told by   your health care provider, take low-dose aspirin daily starting at age 50. Find healthy ways to cope with stress, such as: Meditation, yoga, or listening to music. Journaling. Talking to a trusted person. Spending time with friends and  family. Safety Always wear your seat belt while driving or riding in a vehicle. Do not drive: If you have been drinking alcohol. Do not ride with someone who has been drinking. When you are tired or distracted. While texting. Wear a helmet and other protective equipment during sports activities. If you have firearms in your house, make sure you follow all gun safety procedures. What's next? Go to your health care provider once a year for an annual wellness visit. Ask your health care provider how often you should have your eyes and teeth checked. Stay up to date on all vaccines. This information is not intended to replace advice given to you by your health care provider. Make sure you discuss any questions you have with your healthcare provider. Document Revised: 04/30/2019 Document Reviewed: 07/26/2018 Elsevier Patient Education  2022 Elsevier Inc.  

## 2021-02-09 ENCOUNTER — Other Ambulatory Visit: Payer: Self-pay | Admitting: Family Medicine

## 2021-03-04 DIAGNOSIS — M170 Bilateral primary osteoarthritis of knee: Secondary | ICD-10-CM

## 2021-03-04 MED ORDER — HYDROCODONE-ACETAMINOPHEN 5-325 MG PO TABS: 1.0000 | ORAL_TABLET | Freq: Three times a day (TID) | ORAL | 0 refills | Status: DC | PRN

## 2021-03-04 NOTE — Telephone Encounter (Signed)
1 more.

## 2021-04-01 ENCOUNTER — Ambulatory Visit (AMBULATORY_SURGERY_CENTER): Payer: No Typology Code available for payment source | Admitting: *Deleted

## 2021-04-01 ENCOUNTER — Other Ambulatory Visit: Payer: Self-pay

## 2021-04-01 VITALS — Ht 73.0 in | Wt 280.0 lb

## 2021-04-01 DIAGNOSIS — Z1211 Encounter for screening for malignant neoplasm of colon: Secondary | ICD-10-CM

## 2021-04-01 MED ORDER — PEG-KCL-NACL-NASULF-NA ASC-C 100 G PO SOLR: 1.0000 | Freq: Once | ORAL | 0 refills | Status: AC

## 2021-04-01 NOTE — Progress Notes (Signed)
Pt verified name, DOB, address and insurance during PV today. Pt mailed instruction packet to included paper to complete and mail back to [facility] with addressed and stamped envelope, Emmi video, copy of consent form to read and not return, and instructions. [employer]  coupon mailed in packet. PV completed over the phone. Pt encouraged to call with questions or issues. My Chart instructions to pt as well    No egg or soy allergy known to patient  No issues with past sedation with any surgeries or procedures Patient denies ever being told they had issues or difficulty with intubation  No FH of Malignant Hyperthermia No diet pills per patient No home 02 use per patient  No blood thinners per patient  Pt denies issues with constipation  No A fib or A flutter  EMMI video to pt or via [place] 19 guidelines implemented in [place] today with Pt and RN  Pt is fully vaccinated  for [employer] given to pt in PV today , Code to Pharmacy and  NO PA's for preps discussed with pt In PV today  Discussed with pt there will be an out-of-pocket cost for prep and that varies from $0 to 70 dollars   Due to the COVID-19 pandemic we are asking patients to follow certain guidelines.  Pt aware of COVID protocols and [facility] guidelines

## 2021-04-15 ENCOUNTER — Encounter: Payer: Self-pay | Admitting: Gastroenterology

## 2021-04-15 ENCOUNTER — Other Ambulatory Visit: Payer: Self-pay

## 2021-04-15 ENCOUNTER — Ambulatory Visit (AMBULATORY_SURGERY_CENTER): Payer: No Typology Code available for payment source | Admitting: Gastroenterology

## 2021-04-15 VITALS — BP 140/77 | HR 79 | Temp 97.3°F | Resp 17 | Ht 73.0 in | Wt 280.0 lb

## 2021-04-15 DIAGNOSIS — D124 Benign neoplasm of descending colon: Secondary | ICD-10-CM

## 2021-04-15 DIAGNOSIS — Z1211 Encounter for screening for malignant neoplasm of colon: Secondary | ICD-10-CM

## 2021-04-15 MED ORDER — SODIUM CHLORIDE 0.9 % IV SOLN: 500.0000 mL | Freq: Once | INTRAVENOUS | Status: DC

## 2021-04-15 NOTE — Patient Instructions (Signed)
YOU HAD AN ENDOSCOPIC PROCEDURE TODAY AT THE [facility]:   Refer to the procedure report that was given to you for any specific questions about what was found during the examination.  If the procedure report does not answer your questions, please call your gastroenterologist to clarify.  If you requested that your care partner not be given the details of your procedure findings, then the procedure report has been included in a sealed envelope for you to review at your convenience later.  YOU SHOULD EXPECT: Some feelings of bloating in the abdomen. Passage of more gas than usual.  Walking can help get rid of the air that was put into your GI tract during the procedure and reduce the bloating. If you had a lower endoscopy (such as a colonoscopy or flexible sigmoidoscopy) you may notice spotting of blood in your stool or on the toilet paper. If you underwent a bowel prep for your procedure, you may not have a normal bowel movement for a few days.  Please Note:  You might notice some irritation and congestion in your nose or some drainage.  This is from the oxygen used during your procedure.  There is no need for concern and it should clear up in a day or so.  SYMPTOMS TO REPORT IMMEDIATELY:   Following lower endoscopy (colonoscopy or flexible sigmoidoscopy):  Excessive amounts of blood in the stool  Significant tenderness or worsening of abdominal pains  Swelling of the abdomen that is new, acute  Fever of 100F or higher  For urgent or emergent issues, a gastroenterologist can be reached at any hour by calling [phone]. Do not use MyChart messaging for urgent concerns.    DIET:  We do recommend a small meal at first, but then you may proceed to your regular diet.  Drink plenty of fluids but you should avoid alcoholic beverages for 24 hours.  ACTIVITY:  You should plan to take it easy for the rest of today and you should NOT DRIVE or use heavy machinery until tomorrow (because  of the sedation medicines used during the test).    FOLLOW UP: Our staff will call the number listed on your records 48-72 hours following your procedure to check on you and address any questions or concerns that you may have regarding the information given to you following your procedure. If we do not reach you, we will leave a message.  We will attempt to reach you two times.  During this call, we will ask if you have developed any symptoms of COVID 19. If you develop any symptoms (ie: fever, flu-like symptoms, shortness of breath, cough etc.) before then, please call [phone].  If you test positive for Covid 19 in the 2 weeks post procedure, please call and report this information to us.    If any biopsies were taken you will be contacted by phone or by letter within the next 1-3 weeks.  Please call us at [phone] if you have not heard about the biopsies in 3 weeks.    SIGNATURES/CONFIDENTIALITY: You and/or your care partner have signed paperwork which will be entered into your electronic medical record.  These signatures attest to the fact that that the information above on your After Visit Summary has been reviewed and is understood.  Full responsibility of the confidentiality of this discharge information lies with you and/or your care-partner. 

## 2021-04-15 NOTE — Progress Notes (Signed)
Called to room to assist during endoscopic procedure.  Patient ID and intended procedure confirmed with present staff. Received instructions for my participation in the procedure from the performing physician.  

## 2021-04-15 NOTE — Progress Notes (Signed)
Report to PACU, RN, vss, BBS= Clear.  

## 2021-04-15 NOTE — Progress Notes (Signed)
[facility] History and Physical   Primary Care Physician:  Luetta Nutting, DO   Reason for Procedure:   Colorectal cancer screening  Plan:     colonoscopy     HPI: Albert Hogan is a 50 y.o. male    Past Medical History:  Diagnosis Date   Allergy    mild and not consistent   Anxiety 01/26/2017   Arthritis    knees   Depression, major, single episode, complete remission ([place]) 02/01/2018   ED (erectile dysfunction)    Glaucoma    Hypertension    Morbid obesity ([place]) 04/21/2017    Past Surgical History:  Procedure Laterality Date   APPENDECTOMY  2001   CIRCUMCISION     age 45   HERNIA REPAIR     KNEE SURGERY Right 11/27/2015   Partial medial and lateral meniscetomy and microfracture lateral femoral condyle. Dr Minda Meo [facility] [place]    Prior to Admission medications   Medication Sig Start Date End Date Taking? Authorizing Provider  atorvastatin (LIPITOR) 20 MG tablet Take 1 tablet (20 mg total) by mouth daily. 02/03/21  Yes Luetta Nutting, DO  Multiple Vitamin (MULTIVITAMIN) tablet Take 1 tablet by mouth daily.   Yes [provider]  olmesartan-hydrochlorothiazide (BENICAR HCT) 40-25 MG tablet Take 1 tablet by mouth daily. 02/03/21  Yes Matthews, Cody, DO  ROCKLATAN 0.02-0.005 % SOLN Apply 1 drop to eye at bedtime. 02/08/21  Yes [provider]  tadalafil (CIALIS) 20 MG tablet TAKE 1 TABLET BY MOUTH ONCE DAILY AS NEEDED 02/10/21   Luetta Nutting, DO    Current Outpatient Medications  Medication Sig Dispense Refill   atorvastatin (LIPITOR) 20 MG tablet Take 1 tablet (20 mg total) by mouth daily. 90 tablet 2   Multiple Vitamin (MULTIVITAMIN) tablet Take 1 tablet by mouth daily.     olmesartan-hydrochlorothiazide (BENICAR HCT) 40-25 MG tablet Take 1 tablet by mouth daily. 90 tablet 2   ROCKLATAN 0.02-0.005 % SOLN Apply 1 drop to eye at bedtime.     tadalafil (CIALIS) 20 MG tablet TAKE 1 TABLET BY MOUTH ONCE DAILY AS  NEEDED 6 tablet 6   Current Facility-Administered Medications  Medication Dose Route Frequency Provider Last Rate Last Admin   0.9 %  sodium chloride infusion  500 mL Intravenous Once Jackquline Denmark, MD        Allergies as of 04/15/2021 - Review Complete 04/15/2021  Allergen Reaction Noted   Shellfish allergy Anaphylaxis 12/21/2011    Family History  Problem Relation Age of Onset   Heart disease Mother    Hypertension Mother    Thyroid disease Mother    Colon polyps Father    Hypertension Father    Congestive Heart Failure Father    Colon cancer Neg Hx    Esophageal cancer Neg Hx    Rectal cancer Neg Hx    Stomach cancer Neg Hx     Social History   Socioeconomic History   Marital status: Single    Spouse name: Not on file   Number of children: Not on file   Years of education: Not on file   Highest education level: Not on file  Occupational History   Not on file  Tobacco Use   Smoking status: Never   Smokeless tobacco: Never   Tobacco comments:    Occ cigar- maybe 3 x a year   Vaping Use   Vaping Use: Never used  Substance and Sexual Activity   Alcohol use: Yes  Comment: Occ- socially   Drug use: No   Sexual activity: Not on file  Other Topics Concern   Not on file  Social History Narrative   Not on file   Social Determinants of Health   Financial Resource Strain: Not on file  Food Insecurity: Not on file  Transportation Needs: Not on file  Physical Activity: Not on file  Stress: Not on file  Social Connections: Not on file  Intimate Partner Violence: Not on file    Review of Systems: Positive for none All other review of systems negative except as mentioned in the HPI.  Physical Exam: Vital signs in last 24 hours: '@VSRANGES'$ @   General:   Alert,  Well-developed, well-nourished, pleasant and cooperative in NAD Lungs:  Clear throughout to auscultation.   Heart:  Regular rate and rhythm; no murmurs, clicks, rubs,  or gallops. Abdomen:  Soft,  nontender and nondistended. Normal bowel sounds.   Neuro/Psych:  Alert and cooperative. Normal mood and affect. A and O x 3    No significant changes were identified.  The patient continues to be an appropriate candidate for the planned procedure and anesthesia.   Carmell Austria, MD. [facility] 04/15/2021 10:22 AM@

## 2021-04-15 NOTE — Op Note (Signed)
[facility] Patient Name: Albert Hogan Procedure Date: 04/15/2021 10:16 AM MRN: [id number] Endoscopist: Jackquline Denmark , MD Age: 50 Referring MD:  Date of Birth: 05-22-1971 Gender: Male Account #: [id number] Procedure:                Colonoscopy Indications:              Screening for colorectal malignant neoplasm Medicines:                Monitored Anesthesia Care Procedure:                Pre-Anesthesia Assessment:                           - Prior to the procedure, a History and Physical                            was performed, and patient medications and                            allergies were reviewed. The patient's tolerance of                            previous anesthesia was also reviewed. The risks                            and benefits of the procedure and the sedation                            options and risks were discussed with the patient.                            All questions were answered, and informed consent                            was obtained. Prior Anticoagulants: The patient has                            taken no previous anticoagulant or antiplatelet                            agents. ASA Grade Assessment: II - A patient with                            mild systemic disease. After reviewing the risks                            and benefits, the patient was deemed in                            satisfactory condition to undergo the procedure.                           After obtaining informed consent, the colonoscope  was passed under direct vision. Throughout the                            procedure, the patient's blood pressure, pulse, and                            oxygen saturations were monitored continuously. The                            Colonoscope was introduced through the anus and                            advanced to the 2 cm into the ileum. The                            colonoscopy was performed  without difficulty. The                            patient tolerated the procedure well. The quality                            of the bowel preparation was good. The terminal                            ileum, ileocecal valve, appendiceal orifice, and                            rectum were photographed. Scope In: 10:30:03 AM Scope Out: 10:43:42 AM Scope Withdrawal Time: 0 hours 11 minutes 36 seconds  Total Procedure Duration: 0 hours 13 minutes 39 seconds  Findings:                 A 10 mm polyp was found in the mid descending                            colon. The polyp was semi-pedunculated. The polyp                            was removed with a hot snare. Resection and                            retrieval were complete.                           A few rare small-mouthed diverticula were found in                            the sigmoid colon and ascending colon.                           Non-bleeding internal hemorrhoids were found during                            retroflexion. The hemorrhoids were small and Grade  I (internal hemorrhoids that do not prolapse).                           The terminal ileum appeared normal.                           The exam was otherwise without abnormality on                            direct and retroflexion views. Complications:            No immediate complications. Estimated Blood Loss:     Estimated blood loss: none. Impression:               - One 10 mm polyp in the mid descending colon,                            removed with a hot snare. Resected and retrieved.                           - Very mild pancolonic diverticulosis.                           - Non-bleeding internal hemorrhoids.                           - The examined portion of the ileum was normal.                           - The examination was otherwise normal on direct                            and retroflexion views. Recommendation:           - Patient  has a contact number available for                            emergencies. The signs and symptoms of potential                            delayed complications were discussed with the                            patient. Return to normal activities tomorrow.                            Written discharge instructions were provided to the                            patient.                           - High fiber diet.                           - Continue present medications.                           -  Await pathology results.                           - Repeat colonoscopy for surveillance based on                            pathology results.                           - No aspirin, ibuprofen, naproxen, or other                            non-steroidal anti-inflammatory drugs for 5 days                            after polyp removal.                           - The findings and recommendations were discussed                            with the designated responsible adult. Jackquline Denmark, MD 04/15/2021 10:47:56 AM This report has been signed electronically.

## 2021-04-15 NOTE — Progress Notes (Signed)
Pt's states no medical or surgical changes since previsit or office visit. 

## 2021-04-20 ENCOUNTER — Telehealth: Payer: Self-pay

## 2021-04-20 NOTE — Telephone Encounter (Signed)
  Follow up Call-  Call back number 04/15/2021  Post procedure Call Back phone  # [phone]  Permission to leave phone message Yes  Some recent data might be hidden     Patient questions:  Do you have a fever, pain , or abdominal swelling? No. Pain Score  0 *  Have you tolerated food without any problems? Yes.    Have you been able to return to your normal activities? Yes.    Do you have any questions about your discharge instructions: Diet   No. Medications  No. Follow up visit  No.  Do you have questions or concerns about your Care? No.  Actions: * If pain score is 4 or above: No action needed, pain <4.  Have you developed a fever since your procedure? no  2.   Have you had an respiratory symptoms (SOB or cough) since your procedure? no  3.   Have you tested positive for COVID 19 since your procedure no  4.   Have you had any family members/close contacts diagnosed with the COVID 19 since your procedure?  no   If yes to any of these questions please route to Joylene John, RN and Joella Prince, RN

## 2021-04-20 NOTE — Telephone Encounter (Signed)
Left message on follow up call. 

## 2021-04-24 ENCOUNTER — Encounter: Payer: Self-pay | Admitting: Gastroenterology

## 2021-04-26 ENCOUNTER — Ambulatory Visit (INDEPENDENT_AMBULATORY_CARE_PROVIDER_SITE_OTHER): Payer: No Typology Code available for payment source | Admitting: Sports Medicine

## 2021-04-26 ENCOUNTER — Ambulatory Visit (INDEPENDENT_AMBULATORY_CARE_PROVIDER_SITE_OTHER): Payer: No Typology Code available for payment source

## 2021-04-26 DIAGNOSIS — M170 Bilateral primary osteoarthritis of knee: Secondary | ICD-10-CM

## 2021-04-26 NOTE — Progress Notes (Signed)
    Procedures performed today:    Procedure: Real-time Ultrasound Guided injection of the left knee Device: Samsung HS60  Verbal informed consent obtained.  Time-out conducted.  Noted no overlying erythema, induration, or other signs of local infection.  Skin prepped in a sterile fashion.  Local anesthesia: Topical Ethyl chloride.  With sterile technique and under real time ultrasound guidance:  Noted effusion, 1 cc Kenalog 40, 2 cc lidocaine, 2 cc bupivacaine injected easily Completed without difficulty  Advised to call if fevers/chills, erythema, induration, drainage, or persistent bleeding.  Images permanently stored and available for review in PACS.  Impression: Technically successful ultrasound guided injection.   Procedure: Real-time Ultrasound Guided injection of the right knee Device: Samsung HS60  Verbal informed consent obtained.  Time-out conducted.  Noted no overlying erythema, induration, or other signs of local infection.  Skin prepped in a sterile fashion.  Local anesthesia: Topical Ethyl chloride.  With sterile technique and under real time ultrasound guidance:  Noted effusion, 1 cc Kenalog 40, 2 cc lidocaine, 2 cc bupivacaine injected easily Completed without difficulty  Advised to call if fevers/chills, erythema, induration, drainage, or persistent bleeding.  Images permanently stored and available for review in PACS.  Impression: Technically successful ultrasound guided injection.  Independent interpretation of notes and tests performed by another provider:   None.  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 Last injection was in April, repeat bilateral knee injection today, we also are writing  him a letter per his request he sustained an injury resulting in osteoarthritis of his right knee, he tended to favor the knee and developed secondary osteoarthritis in the contralateral knee. Return as needed.    ___________________________________________ Gwen Her. Dianah Field, M.D., ABFM., CAQSM. Primary Care and [place] Instructor of [employer] [facility] [employer]

## 2021-04-26 NOTE — Assessment & Plan Note (Signed)
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 Return as needed.

## 2021-04-27 DIAGNOSIS — M170 Bilateral primary osteoarthritis of knee: Secondary | ICD-10-CM

## 2021-04-27 MED ORDER — TRAMADOL HCL 50 MG PO TABS
50.0000 mg | ORAL_TABLET | Freq: Two times a day (BID) | ORAL | 0 refills | Status: DC | PRN
Start: 2021-04-27 — End: 2021-06-18

## 2021-06-18 ENCOUNTER — Other Ambulatory Visit: Payer: Self-pay

## 2021-06-18 DIAGNOSIS — M170 Bilateral primary osteoarthritis of knee: Secondary | ICD-10-CM

## 2021-06-18 MED ORDER — TRAMADOL HCL 50 MG PO TABS: 50.0000 mg | ORAL_TABLET | Freq: Two times a day (BID) | ORAL | 0 refills | Status: DC | PRN

## 2021-07-19 ENCOUNTER — Encounter: Payer: Self-pay | Admitting: Sports Medicine

## 2021-07-19 NOTE — Telephone Encounter (Signed)
Order is under imaging tab

## 2021-08-03 ENCOUNTER — Encounter: Payer: Self-pay | Admitting: Sports Medicine

## 2021-08-03 DIAGNOSIS — M170 Bilateral primary osteoarthritis of knee: Secondary | ICD-10-CM

## 2021-08-30 ENCOUNTER — Other Ambulatory Visit: Payer: Self-pay | Admitting: Sports Medicine

## 2021-08-30 DIAGNOSIS — M170 Bilateral primary osteoarthritis of knee: Secondary | ICD-10-CM

## 2021-08-30 MED ORDER — TRAMADOL HCL 50 MG PO TABS: 50.0000 mg | ORAL_TABLET | Freq: Two times a day (BID) | ORAL | 0 refills | Status: DC | PRN

## 2021-10-25 ENCOUNTER — Other Ambulatory Visit: Payer: Self-pay | Admitting: Sports Medicine

## 2021-10-25 DIAGNOSIS — M170 Bilateral primary osteoarthritis of knee: Secondary | ICD-10-CM

## 2021-10-25 MED ORDER — TRAMADOL HCL 50 MG PO TABS: 50.0000 mg | ORAL_TABLET | Freq: Two times a day (BID) | ORAL | 0 refills | Status: DC | PRN

## 2021-11-18 ENCOUNTER — Other Ambulatory Visit: Payer: Self-pay | Admitting: Family Medicine

## 2021-11-18 NOTE — Telephone Encounter (Signed)
Pls contact the patient to schedule with Dr. Zigmund Daniel. He was due for follow-up appt in December 2022. Sending 30 day refills.  ? ?Thanks ?

## 2021-12-12 ENCOUNTER — Other Ambulatory Visit: Payer: Self-pay | Admitting: Sports Medicine

## 2021-12-12 DIAGNOSIS — M170 Bilateral primary osteoarthritis of knee: Secondary | ICD-10-CM

## 2021-12-13 MED ORDER — TRAMADOL HCL 50 MG PO TABS: 50.0000 mg | ORAL_TABLET | Freq: Two times a day (BID) | ORAL | 0 refills | Status: DC | PRN

## 2021-12-17 ENCOUNTER — Other Ambulatory Visit: Payer: Self-pay | Admitting: Family Medicine

## 2021-12-22 ENCOUNTER — Other Ambulatory Visit: Payer: Self-pay | Admitting: Family Medicine

## 2022-01-15 ENCOUNTER — Other Ambulatory Visit: Payer: Self-pay | Admitting: Family Medicine

## 2022-01-18 ENCOUNTER — Encounter: Payer: Self-pay | Admitting: Physical Therapy

## 2022-01-19 ENCOUNTER — Ambulatory Visit: Payer: No Typology Code available for payment source | Admitting: Family Medicine

## 2022-02-01 ENCOUNTER — Other Ambulatory Visit: Payer: Self-pay | Admitting: Sports Medicine

## 2022-02-01 DIAGNOSIS — M170 Bilateral primary osteoarthritis of knee: Secondary | ICD-10-CM

## 2022-02-02 MED ORDER — TRAMADOL HCL 50 MG PO TABS: 50.0000 mg | ORAL_TABLET | Freq: Two times a day (BID) | ORAL | 0 refills | Status: DC | PRN

## 2022-02-18 ENCOUNTER — Other Ambulatory Visit: Payer: Self-pay | Admitting: Family Medicine

## 2022-03-02 ENCOUNTER — Ambulatory Visit (INDEPENDENT_AMBULATORY_CARE_PROVIDER_SITE_OTHER): Payer: No Typology Code available for payment source | Admitting: Family Medicine

## 2022-03-02 ENCOUNTER — Encounter: Payer: Self-pay | Admitting: Family Medicine

## 2022-03-02 VITALS — BP 127/82 | HR 80 | Ht 73.0 in | Wt 279.0 lb

## 2022-03-02 DIAGNOSIS — Z0000 Encounter for general adult medical examination without abnormal findings: Secondary | ICD-10-CM

## 2022-03-02 DIAGNOSIS — Z125 Encounter for screening for malignant neoplasm of prostate: Secondary | ICD-10-CM | POA: Diagnosis not present

## 2022-03-02 DIAGNOSIS — I10 Essential (primary) hypertension: Secondary | ICD-10-CM | POA: Diagnosis not present

## 2022-03-02 DIAGNOSIS — R6882 Decreased libido: Secondary | ICD-10-CM

## 2022-03-02 DIAGNOSIS — E782 Mixed hyperlipidemia: Secondary | ICD-10-CM

## 2022-03-02 DIAGNOSIS — M25511 Pain in right shoulder: Secondary | ICD-10-CM

## 2022-03-02 DIAGNOSIS — F341 Dysthymic disorder: Secondary | ICD-10-CM

## 2022-03-02 MED ORDER — SERTRALINE HCL 50 MG PO TABS: ORAL_TABLET | ORAL | 3 refills | Status: DC

## 2022-03-02 NOTE — Assessment & Plan Note (Signed)
Adding sertraline back on, '25mg'$  x1 week then increase to '50mg'$ .  F/u in 3 months.

## 2022-03-02 NOTE — Progress Notes (Signed)
Albert Hogan - 51 y.o. male MRN [id number]  Date of birth: 1970/09/10  Subjective Chief Complaint  Patient presents with   Annual Exam    HPI Albert Hogan is a 51 y.o. male here today for annual exam.    Has had some dysthymia and depressive symptoms over the past several years.  Symptoms wax and wane.  He has tried sertraline previously and feels that he needs to restart this.  Would like to add this back on.    Having some shoulder pain.  Pain with sleeping on shoulder.    He is a non-smoker.  Consume EtOh occasionally.  Tries to stay active and feels that diet is pretty good.    Declines Shingrix.   Review of Systems  Constitutional:  Negative for chills, fever, malaise/fatigue and weight loss.  HENT:  Negative for congestion, ear pain and sore throat.   Eyes:  Negative for blurred vision, double vision and pain.  Respiratory:  Negative for cough and shortness of breath.   Cardiovascular:  Negative for chest pain and palpitations.  Gastrointestinal:  Negative for abdominal pain, blood in stool, constipation, heartburn and nausea.  Genitourinary:  Negative for dysuria and urgency.  Musculoskeletal:  Positive for joint pain. Negative for myalgias.  Neurological:  Negative for dizziness and headaches.  Endo/Heme/Allergies:  Does not bruise/bleed easily.  Psychiatric/Behavioral:  Negative for depression. The patient is not nervous/anxious and does not have insomnia.       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 Dr Minda Meo [facility] [place]    Social History   Socioeconomic History   Marital status: Single    Spouse name: Not on file   Number of children: Not on file   Years of education: Not on file   Highest education level: Not on file  Occupational History   Not on file  Tobacco Use   Smoking status: Never   Smokeless tobacco: Never   Tobacco comments:    Occ cigar- maybe 3 x a year   Vaping Use   Vaping Use: Never used  Substance and Sexual Activity   Alcohol use: Yes    Comment: Occ- socially   Drug use: No   Sexual activity: Not on file  Other Topics Concern   Not on file  Social History Narrative   Not on file   Social Determinants of Health   Financial Resource Strain: Not on file  Food Insecurity: Not on file  Transportation Needs: Not on file  Physical Activity: Insufficiently Active (02/01/2018)   Exercise Vital Sign    Days of Exercise per Week: 4 days    Minutes of Exercise per Session: 30 min  Stress: Not on file  Social Connections: Not on file    Family History  Problem Relation Age of Onset   Heart disease Mother  Hypertension Mother    Thyroid disease Mother    Colon polyps Father    Hypertension Father    Congestive Heart Failure Father    Colon cancer Neg Hx    Esophageal cancer Neg Hx    Rectal cancer Neg Hx    Stomach cancer Neg Hx     Health Maintenance  Topic Date Due   COVID-19 Vaccine (3 - Pfizer series) 03/18/2022 (Originally 07/24/2020)   Zoster Vaccines- Shingrix (1 of 2) 06/02/2022 (Originally 07/11/2021)   TETANUS/TDAP  03/03/2023 (Originally 08/15/2021)   INFLUENZA VACCINE  03/15/2022   COLONOSCOPY (Pts 45-71yr Insurance coverage will need to be confirmed)  04/15/2024   Hepatitis C Screening  Completed   HIV Screening  Completed   HPV VACCINES  Aged Out      ----------------------------------------------------------------------------------------------------------------------------------------------------------------------------------------------------------------- Physical Exam BP 127/82 (BP Location: Left Arm, Patient Position: Sitting, Cuff Size: Large)   Pulse 80   Ht '6\' 1"'$  (1.854 m)   Wt 279 lb (126.6 kg)   SpO2 97%   BMI 36.81 kg/m   Physical Exam Constitutional:      General: He is not in acute distress. HENT:     Head: Normocephalic and atraumatic.     Right Ear: Tympanic membrane and external ear normal.     Left Ear: Tympanic membrane and external ear normal.  Eyes:     General: No scleral icterus. Neck:     Thyroid: No thyromegaly.  Cardiovascular:     Rate and Rhythm: Normal rate and regular rhythm.     Heart sounds: Normal heart sounds.  Pulmonary:     Effort: Pulmonary effort is normal.     Breath sounds: Normal breath sounds.  Abdominal:     General: Bowel sounds are normal. There is no distension.     Palpations: Abdomen is soft.     Tenderness: There is no abdominal tenderness. There is no guarding.  Musculoskeletal:     Cervical back: Normal range of motion.  Lymphadenopathy:     Cervical: No cervical adenopathy.  Skin:    General: Skin is warm and dry.     Findings: No rash.  Neurological:     Mental Status: He is alert and oriented to person, place, and time.     Cranial Nerves: No cranial nerve deficit.     Motor: No abnormal muscle tone.  Psychiatric:        Mood and Affect: Mood normal.        Behavior: Behavior normal.     ------------------------------------------------------------------------------------------------------------------------------------------------------------------------------------------------------------------- Assessment and Plan  Dysthymia Adding sertraline back on, '25mg'$  x1 week then increase to '50mg'$ .  F/u in 3 months.   Well adult exam Well adult Orders Placed  This Encounter  Procedures   COMPLETE METABOLIC PANEL WITH GFR   CBC with Differential   Lipid Panel w/reflex Direct LDL   TSH   PSA   Testosterone  Screening:  Per lab orders Immunizations: Declines Shingrix.  Anticipatory guidance/Risk factor reduction:  Recommendations per AVS.    Right shoulder pain Discussed trying to modify sleep position.  Let me know if not improving.     Meds ordered this encounter  Medications   sertraline (ZOLOFT) 50 MG tablet    Sig: Take 1/2 tab x7 days then increase to full tab.    Dispense:  30 tablet    Refill:  3    Return in about 3 months (around 06/02/2022) for MDD.    This visit occurred during the SARS-CoV-2 public  health emergency.  Safety protocols were in place, including screening questions prior to the visit, additional usage of staff PPE, and extensive cleaning of exam room while observing appropriate contact time as indicated for disinfecting solutions.

## 2022-03-02 NOTE — Assessment & Plan Note (Signed)
Well adult Orders Placed This Encounter  Procedures  . COMPLETE METABOLIC PANEL WITH GFR  . CBC with Differential  . Lipid Panel w/reflex Direct LDL  . TSH  . PSA  . Testosterone  Screening:  Per lab orders Immunizations: Declines Shingrix.  Anticipatory guidance/Risk factor reduction:  Recommendations per AVS.

## 2022-03-02 NOTE — Assessment & Plan Note (Signed)
Discussed trying to modify sleep position.  Let me know if not improving.

## 2022-03-02 NOTE — Patient Instructions (Addendum)
Try adding sertraline back on.      Preventive Care 51-51 Years Old,  Male Preventive care refers to lifestyle choices and visits with your health care provider that can promote health and wellness. Preventive care visits are also called wellness exams. What can I expect for my preventive care visit? Counseling During your preventive care visit, your health care provider may ask about your: Medical history, including: Past medical problems. Family medical history. Current health, including: Emotional well-being. Home life and relationship well-being. Sexual activity. Lifestyle, including: Alcohol, nicotine or tobacco, and drug use. Access to firearms. Diet, exercise, and sleep habits. Safety issues such as seatbelt and bike helmet use. Sunscreen use. Work and work Statistician. Physical exam Your health care provider will check your: Height and weight. These may be used to calculate your BMI (body mass index). BMI is a measurement that tells if you are at a healthy weight. Waist circumference. This measures the distance around your waistline. This measurement also tells if you are at a healthy weight and may help predict your risk of certain diseases, such as type 2 diabetes and high blood pressure. Heart rate and blood pressure. Body temperature. Skin for abnormal spots. What immunizations do I need?  Vaccines are usually given at various ages, according to a schedule. Your health care provider will recommend vaccines for you based on your age, medical history, and lifestyle or other factors, such as travel or where you work. What tests do I need? Screening Your health care provider may recommend screening tests for certain conditions. This may include: Lipid and cholesterol levels. Diabetes screening. This is done by checking your blood sugar (glucose) after you have not eaten for a while (fasting). Hepatitis B test. Hepatitis C test. HIV (human immunodeficiency virus)  test. STI (sexually transmitted infection) testing, if you are at risk. Lung cancer screening. Prostate cancer screening. Colorectal cancer screening. Talk with your health care provider about your test results, treatment options, and if necessary, the need for more tests. Follow these instructions at home: Eating and drinking  Eat a diet that includes fresh fruits and vegetables, whole grains, lean protein, and low-fat dairy products. Take vitamin and mineral supplements as recommended by your health care provider. Do not drink alcohol if your health care provider tells you not to drink. If you drink alcohol: Limit how much you have to 0-2 drinks a day. Know how much alcohol is in your drink. In the [place] one drink equals one 12 oz bottle of beer (355 mL), one 5 oz glass of wine (148 mL), or one 1 oz glass of hard liquor (44 mL). Lifestyle Brush your teeth every morning and night with fluoride toothpaste. Floss one time each day. Exercise for at least 30 minutes 5 or more days each week. Do not use any products that contain nicotine or tobacco. These products include cigarettes, chewing tobacco, and vaping devices, such as e-cigarettes. If you need help quitting, ask your health care provider. Do not use drugs. If you are sexually active, practice safe sex. Use a condom or other form of protection to prevent STIs. Take aspirin only as told by your health care provider. Make sure that you understand how much to take and what form to take. Work with your health care provider to find out whether it is safe and beneficial for you to take aspirin daily. Find healthy ways to manage stress, such as: Meditation, yoga, or listening to music. Journaling. Talking to a trusted person. Spending time with  friends and family. Minimize exposure to UV radiation to reduce your risk of skin cancer. Safety Always wear your seat belt while driving or riding in a vehicle. Do not drive: If you have been  drinking alcohol. Do not ride with someone who has been drinking. When you are tired or distracted. While texting. If you have been using any mind-altering substances or drugs. Wear a helmet and other protective equipment during sports activities. If you have firearms in your house, make sure you follow all gun safety procedures. What's next? Go to your health care provider once a year for an annual wellness visit. Ask your health care provider how often you should have your eyes and teeth checked. Stay up to date on all vaccines. This information is not intended to replace advice given to you by your health care provider. Make sure you discuss any questions you have with your health care provider. Document Revised: 01/27/2021 Document Reviewed: 01/27/2021 Elsevier Patient Education  [employer] [place].

## 2022-03-03 LAB — COMPLETE METABOLIC PANEL WITH GFR
AG Ratio: 1.6 (calc) (ref 1.0–2.5)
ALT: 22 U/L (ref 9–46)
AST: 23 U/L (ref 10–35)
Albumin: 4.5 g/dL (ref 3.6–5.1)
Alkaline phosphatase (APISO): 88 U/L (ref 35–144)
BUN/Creatinine Ratio: 15 (calc) (ref 6–22)
BUN: 20 mg/dL (ref 7–25)
CO2: 28 mmol/L (ref 20–32)
Calcium: 9.5 mg/dL (ref 8.6–10.3)
Chloride: 101 mmol/L (ref 98–110)
Creat: 1.32 mg/dL — ABNORMAL HIGH (ref 0.70–1.30)
Globulin: 2.8 g/dL (calc) (ref 1.9–3.7)
Glucose, Bld: 91 mg/dL (ref 65–99)
Potassium: 4.5 mmol/L (ref 3.5–5.3)
Sodium: 140 mmol/L (ref 135–146)
Total Bilirubin: 0.5 mg/dL (ref 0.2–1.2)
Total Protein: 7.3 g/dL (ref 6.1–8.1)
eGFR: 66 mL/min/{1.73_m2} (ref >=60)

## 2022-03-03 LAB — LIPID PANEL W/REFLEX DIRECT LDL
Cholesterol: 140 mg/dL (ref <200)
HDL: 56 mg/dL (ref >=40)
LDL Cholesterol (Calc): 68 mg/dL (calc)
Non-HDL Cholesterol (Calc): 84 mg/dL (calc) (ref <130)
Total CHOL/HDL Ratio: 2.5 (calc) (ref <5.0)
Triglycerides: 78 mg/dL (ref <150)

## 2022-03-03 LAB — CBC WITH DIFFERENTIAL/PLATELET
Absolute Monocytes: 540 cells/uL (ref 200–950)
Basophils Absolute: 29 cells/uL (ref 0–200)
Basophils Relative: 0.4 %
Eosinophils Absolute: 158 cells/uL (ref 15–500)
Eosinophils Relative: 2.2 %
HCT: 43.9 % (ref 38.5–50.0)
Hemoglobin: 14.7 g/dL (ref 13.2–17.1)
Lymphs Abs: 2095 cells/uL (ref 850–3900)
MCH: 29.9 pg (ref 27.0–33.0)
MCHC: 33.5 g/dL (ref 32.0–36.0)
MCV: 89.4 fL (ref 80.0–100.0)
MPV: 12 fL (ref 7.5–12.5)
Monocytes Relative: 7.5 %
Neutro Abs: 4378 cells/uL (ref 1500–7800)
Neutrophils Relative %: 60.8 %
Platelets: 183 10*3/uL (ref 140–400)
RBC: 4.91 10*6/uL (ref 4.20–5.80)
RDW: 13.8 % (ref 11.0–15.0)
Total Lymphocyte: 29.1 %
WBC: 7.2 10*3/uL (ref 3.8–10.8)

## 2022-03-03 LAB — PSA: PSA: 0.49 ng/mL (ref <=4.00)

## 2022-03-03 LAB — TESTOSTERONE: Testosterone: 484 ng/dL (ref 250–827)

## 2022-03-03 LAB — TSH: TSH: 2.01 mIU/L (ref 0.40–4.50)

## 2022-03-25 ENCOUNTER — Other Ambulatory Visit: Payer: Self-pay | Admitting: Sports Medicine

## 2022-03-25 DIAGNOSIS — M170 Bilateral primary osteoarthritis of knee: Secondary | ICD-10-CM

## 2022-04-12 ENCOUNTER — Other Ambulatory Visit: Payer: Self-pay | Admitting: Family Medicine

## 2022-04-12 ENCOUNTER — Encounter: Payer: Self-pay | Admitting: Family Medicine

## 2022-04-12 MED ORDER — TADALAFIL 20 MG PO TABS: 20.0000 mg | ORAL_TABLET | Freq: Every day | ORAL | 0 refills | Status: DC | PRN

## 2022-05-03 ENCOUNTER — Other Ambulatory Visit: Payer: Self-pay | Admitting: Family Medicine

## 2022-05-03 DIAGNOSIS — I10 Essential (primary) hypertension: Secondary | ICD-10-CM

## 2022-05-03 DIAGNOSIS — E782 Mixed hyperlipidemia: Secondary | ICD-10-CM

## 2022-05-18 ENCOUNTER — Other Ambulatory Visit: Payer: Self-pay | Admitting: Sports Medicine

## 2022-05-18 ENCOUNTER — Other Ambulatory Visit: Payer: Self-pay | Admitting: Family Medicine

## 2022-05-18 DIAGNOSIS — M170 Bilateral primary osteoarthritis of knee: Secondary | ICD-10-CM

## 2022-06-06 ENCOUNTER — Ambulatory Visit: Payer: PRIVATE HEALTH INSURANCE | Admitting: Family Medicine

## 2022-06-06 ENCOUNTER — Encounter: Payer: Self-pay | Admitting: Family Medicine

## 2022-06-06 DIAGNOSIS — F341 Dysthymic disorder: Secondary | ICD-10-CM | POA: Diagnosis not present

## 2022-06-06 DIAGNOSIS — I10 Essential (primary) hypertension: Secondary | ICD-10-CM | POA: Diagnosis not present

## 2022-06-06 MED ORDER — SERTRALINE HCL 50 MG PO TABS: ORAL_TABLET | ORAL | 3 refills | Status: DC

## 2022-06-06 NOTE — Assessment & Plan Note (Signed)
BP is well controlled with current medications.  Recommend continuation.   

## 2022-06-06 NOTE — Assessment & Plan Note (Signed)
Doing well with sertraline.  He will titrate up to '50mg'$  daily.  We'll  Plan to f/u in 3 months.

## 2022-06-06 NOTE — Progress Notes (Signed)
Albert Hogan - 50 y.o. male MRN [id number]  Date of birth: [birth year]  Subjective Chief Complaint  Patient presents with   Follow-up    MOOD    HPI Albert Hogan is a 51 y.o. male here today for follow up of mood.  He was started on sertraline at previous visit.  Remains on '25mg'$ .  He has noticed that improvement to mood including better control of depression and anxiety.  He denies side effects from medication.  He does plan to go ahead and increase this to '50mg'$ .    BP has remained well controlled with current medication.  Denies side effects with current medications.  Denies chest pain, shortness of breath, palpitations, headache or vision changes.   ROS:  A comprehensive ROS was completed and negative except as noted per HPI  Allergies  Allergen Reactions   Shellfish Allergy Anaphylaxis    Past Medical History:  Diagnosis Date   Allergy    mild and not consistent   Anxiety 01/26/2017   Arthritis    knees   Depression, major, single episode, complete remission ([place]) 02/01/2018   ED (erectile dysfunction)    Glaucoma    Hypertension    Morbid obesity ([place]) 04/21/2017    Past Surgical History:  Procedure Laterality Date   APPENDECTOMY  2001   CIRCUMCISION     age 87   HERNIA REPAIR     KNEE SURGERY Right 11/27/2015   Partial medial and lateral meniscetomy and microfracture lateral femoral condyle. Dr Minda Meo [facility] [place]    Social History   Socioeconomic History   Marital status: Single    Spouse name: Not on file   Number of children: Not on file   Years of education: Not on file   Highest education level: Not on file  Occupational History   Not on file  Tobacco Use   Smoking status: Never   Smokeless tobacco: Never   Tobacco comments:    Occ cigar- maybe 3 x a year   Vaping Use   Vaping Use: Never used  Substance and Sexual Activity   Alcohol use: Yes    Comment: Occ- socially   Drug use: No   Sexual activity: Not on file   Other Topics Concern   Not on file  Social History Narrative   Not on file   Social Determinants of Health   Financial Resource Strain: Not on file  Food Insecurity: Not on file  Transportation Needs: Not on file  Physical Activity: Insufficiently Active (02/01/2018)   Exercise Vital Sign    Days of Exercise per Week: 4 days    Minutes of Exercise per Session: 30 min  Stress: Not on file  Social Connections: Not on file    Family History  Problem Relation Age of Onset   Heart disease Mother    Hypertension Mother    Thyroid disease Mother    Colon polyps Father    Hypertension Father    Congestive Heart Failure Father    Colon cancer Neg Hx    Esophageal cancer Neg Hx    Rectal cancer Neg Hx    Stomach cancer Neg Hx     Health Maintenance  Topic Date Due   COVID-19 Vaccine (3 - Pfizer series) 06/22/2022 (Originally 07/24/2020)   Zoster Vaccines- Shingrix (1 of 2) 09/06/2022 (Originally 07/11/2021)   INFLUENZA VACCINE  11/13/2022 (Originally 03/15/2022)   TETANUS/TDAP  03/03/2023 (Originally 08/15/2021)   COLONOSCOPY (Pts 45-72yr Insurance coverage will need  to be confirmed)  04/15/2024   Hepatitis C Screening  Completed   HIV Screening  Completed   HPV VACCINES  Aged Out     ----------------------------------------------------------------------------------------------------------------------------------------------------------------------------------------------------------------- Physical Exam BP 129/82 (BP Location: Right Arm, Cuff Size: Large)   Pulse 84   Resp 20   Ht '6\' 1"'$  (1.854 m)   Wt 285 lb (129.3 kg)   SpO2 97%   BMI 37.60 kg/m   Physical Exam Constitutional:      Appearance: Normal appearance.  Musculoskeletal:     Cervical back: Neck supple.  Neurological:     Mental Status: He is alert.  Psychiatric:        Mood and Affect: Mood normal.        Behavior: Behavior normal.      ------------------------------------------------------------------------------------------------------------------------------------------------------------------------------------------------------------------- Assessment and Plan  Dysthymia Doing well with sertraline.  He will titrate up to '50mg'$  daily.  We'll  Plan to f/u in 3 months.   Essential hypertension, benign BP is well controlled with current medications.  Recommend continuation    Meds ordered this encounter  Medications   sertraline (ZOLOFT) 50 MG tablet    Sig: Take 1 tab po daily    Dispense:  30 tablet    Refill:  3    Return in about 3 months (around 09/06/2022) for GAD/MDD.    This visit occurred during the SARS-CoV-2 public health emergency.  Safety protocols were in place, including screening questions prior to the visit, additional usage of staff PPE, and extensive cleaning of exam room while observing appropriate contact time as indicated for disinfecting solutions.

## 2022-06-27 ENCOUNTER — Other Ambulatory Visit: Payer: Self-pay | Admitting: Family Medicine

## 2022-06-28 MED ORDER — TADALAFIL 20 MG PO TABS: 20.0000 mg | ORAL_TABLET | Freq: Every day | ORAL | 0 refills | Status: DC | PRN

## 2022-07-25 ENCOUNTER — Other Ambulatory Visit: Payer: Self-pay | Admitting: Sports Medicine

## 2022-07-25 ENCOUNTER — Other Ambulatory Visit: Payer: Self-pay | Admitting: Family Medicine

## 2022-07-25 DIAGNOSIS — M170 Bilateral primary osteoarthritis of knee: Secondary | ICD-10-CM

## 2022-07-29 ENCOUNTER — Other Ambulatory Visit: Payer: Self-pay | Admitting: Sports Medicine

## 2022-07-29 DIAGNOSIS — M170 Bilateral primary osteoarthritis of knee: Secondary | ICD-10-CM

## 2022-07-29 NOTE — Telephone Encounter (Signed)
LOV 06/06/22

## 2022-08-01 MED ORDER — TRAMADOL HCL 50 MG PO TABS: ORAL_TABLET | ORAL | 0 refills | Status: DC

## 2022-09-07 ENCOUNTER — Ambulatory Visit: Payer: No Typology Code available for payment source | Admitting: Family Medicine

## 2022-09-13 ENCOUNTER — Ambulatory Visit: Payer: PRIVATE HEALTH INSURANCE | Admitting: Family Medicine

## 2022-09-13 ENCOUNTER — Encounter: Payer: Self-pay | Admitting: Family Medicine

## 2022-09-13 VITALS — BP 136/86 | HR 81 | Ht 73.0 in | Wt 289.0 lb

## 2022-09-13 DIAGNOSIS — F341 Dysthymic disorder: Secondary | ICD-10-CM

## 2022-09-13 DIAGNOSIS — I10 Essential (primary) hypertension: Secondary | ICD-10-CM | POA: Diagnosis not present

## 2022-09-13 DIAGNOSIS — B351 Tinea unguium: Secondary | ICD-10-CM

## 2022-09-13 MED ORDER — ZEPBOUND 5 MG/0.5ML ~~LOC~~ SOAJ: 5.0000 mg | SUBCUTANEOUS | 0 refills | Status: DC

## 2022-09-13 MED ORDER — CICLOPIROX 8 % EX SOLN: Freq: Every day | CUTANEOUS | 0 refills | Status: DC

## 2022-09-13 MED ORDER — ZEPBOUND 7.5 MG/0.5ML ~~LOC~~ SOAJ: 7.5000 mg | SUBCUTANEOUS | 0 refills | Status: DC

## 2022-09-13 MED ORDER — ZEPBOUND 2.5 MG/0.5ML ~~LOC~~ SOAJ: 2.5000 mg | SUBCUTANEOUS | 0 refills | Status: DC

## 2022-09-13 NOTE — Assessment & Plan Note (Signed)
Adding Penlac topical lacquer.

## 2022-09-13 NOTE — Assessment & Plan Note (Signed)
Discussed GLP-1 medication for weight loss.  Reviewed potential side effects of medications. Will see if Zepbound if covered.  Discussed dietary and exercise changes as well for most effect from medication.

## 2022-09-13 NOTE — Patient Instructions (Signed)
Continue current medications.  Try topical ciclopirox on the nail.  We'll let you know if medication for weight loss gets approved.  See me again in about 6 months.

## 2022-09-13 NOTE — Assessment & Plan Note (Signed)
Doing well with sertraline at current strength.  Will plan to continue.

## 2022-09-13 NOTE — Progress Notes (Signed)
Albert Hogan - 52 y.o. male MRN [id number]  Date of birth: 1971-05-08  Subjective Chief Complaint  Patient presents with   Mood   MDD    HPI Albert Hogan is a 52 y.o. male here today for follow up visit.   He reports that he is doing pretty well.  Mood is improved some.  He is on zoloft for management of anxiety.  Tolerating well at current strength.    BP is mildly elevated today.  Remains on benicar-hct.  No problems with tolerance.  Denies chest pain, palpitations, headache or vision changes.  He feels like diet is pretty good but he is interested in trying medication to help with weight loss.  He has looked at [facility] and would be interested in trying something like this.    He has nail fungus and would like treatment for this.   ROS:  A comprehensive ROS was completed and negative except as noted per HPI  Allergies  Allergen Reactions   Shellfish Allergy Anaphylaxis    Past Medical History:  Diagnosis Date   Allergy    mild and not consistent   Anxiety 01/26/2017   Arthritis    knees   Depression, major, single episode, complete remission ([place]) 02/01/2018   ED (erectile dysfunction)    Glaucoma    Hypertension    Morbid obesity ([place]) 04/21/2017    Past Surgical History:  Procedure Laterality Date   APPENDECTOMY  2001   CIRCUMCISION     age 65   HERNIA REPAIR     KNEE SURGERY Right 11/27/2015   Partial medial and lateral meniscetomy and microfracture lateral femoral condyle. Dr Minda Meo [facility] [place]    Social History   Socioeconomic History   Marital status: Single    Spouse name: Not on file   Number of children: Not on file   Years of education: Not on file   Highest education level: Not on file  Occupational History   Not on file  Tobacco Use   Smoking status: Never   Smokeless tobacco: Never   Tobacco comments:    Occ cigar- maybe 3 x a year   Vaping Use   Vaping Use: Never used  Substance and Sexual Activity    Alcohol use: Yes    Comment: Occ- socially   Drug use: No   Sexual activity: Not on file  Other Topics Concern   Not on file  Social History Narrative   Not on file   Social Determinants of Health   Financial Resource Strain: Not on file  Food Insecurity: Not on file  Transportation Needs: Not on file  Physical Activity: Insufficiently Active (02/01/2018)   Exercise Vital Sign    Days of Exercise per Week: 4 days    Minutes of Exercise per Session: 30 min  Stress: Not on file  Social Connections: Not on file    Family History  Problem Relation Age of Onset   Heart disease Mother    Hypertension Mother    Thyroid disease Mother    Colon polyps Father    Hypertension Father    Congestive Heart Failure Father    Colon cancer Neg Hx    Esophageal cancer Neg Hx    Rectal cancer Neg Hx    Stomach cancer Neg Hx     Health Maintenance  Topic Date Due   DTaP/Tdap/Td (7 - Td or Tdap) 08/15/2021   INFLUENZA VACCINE  11/13/2022 (Originally 03/15/2022)   COVID-19 Vaccine (3 -  2023-24 season) 09/30/2023 (Originally 04/15/2022)   Zoster Vaccines- Shingrix (1 of 2) 12/13/2023 (Originally 07/11/2021)   COLONOSCOPY (Pts 45-15yr Insurance coverage will need to be confirmed)  04/15/2024   Hepatitis C Screening  Completed   HIV Screening  Completed   HPV VACCINES  Aged Out     ----------------------------------------------------------------------------------------------------------------------------------------------------------------------------------------------------------------- Physical Exam BP 136/86 (BP Location: Left Arm, Patient Position: Sitting, Cuff Size: Large)   Pulse 81   Ht '6\' 1"'$  (1.854 m)   Wt 289 lb (131.1 kg)   SpO2 98%   BMI 38.13 kg/m   Physical Exam Constitutional:      Appearance: Normal appearance.  Eyes:     General: No scleral icterus. Cardiovascular:     Rate and Rhythm: Normal rate and regular rhythm.  Pulmonary:     Effort: Pulmonary effort is  normal.     Breath sounds: Normal breath sounds.  Musculoskeletal:     Cervical back: Neck supple.  Neurological:     General: No focal deficit present.     Mental Status: He is alert.  Psychiatric:        Mood and Affect: Mood normal.        Behavior: Behavior normal.     ------------------------------------------------------------------------------------------------------------------------------------------------------------------------------------------------------------------- Assessment and Plan  Onychomycosis of great toe Adding Penlac topical lacquer.    Essential hypertension, benign BP remains fairly well controlled.  Continue benicar-hct at current strength.   Dysthymia Doing well with sertraline at current strength.  Will plan to continue.   Morbid [address] Discussed GLP-1 medication for weight loss.  Reviewed potential side effects of medications. Will see if Zepbound if covered.  Discussed dietary and exercise changes as well for most effect from medication.     Meds ordered this encounter  Medications   ciclopirox (PENLAC) 8 % solution    Sig: Apply topically at bedtime. Apply over nail and surrounding skin. Apply daily over previous coat. After seven (7) days, may remove with alcohol and continue cycle.    Dispense:  6.6 mL    Refill:  0   tirzepatide (ZEPBOUND) 2.5 MG/0.5ML Pen    Sig: Inject 2.5 mg into the skin once a week.    Dispense:  2 mL    Refill:  0   tirzepatide (ZEPBOUND) 5 MG/0.5ML Pen    Sig: Inject 5 mg into the skin once a week.    Dispense:  2 mL    Refill:  0   tirzepatide (ZEPBOUND) 7.5 MG/0.5ML Pen    Sig: Inject 7.5 mg into the skin once a week.    Dispense:  2 mL    Refill:  0    Return in about 6 months (around 03/14/2023) for HTN.    This visit occurred during the SARS-CoV-2 public health emergency.  Safety protocols were in place, including screening questions prior to the visit, additional usage of staff PPE, and  extensive cleaning of exam room while observing appropriate contact time as indicated for disinfecting solutions.

## 2022-09-13 NOTE — Assessment & Plan Note (Signed)
BP remains fairly well controlled.  Continue benicar-hct at current strength.

## 2022-09-21 ENCOUNTER — Encounter: Payer: Self-pay | Admitting: Family Medicine

## 2022-09-21 ENCOUNTER — Telehealth: Payer: Self-pay

## 2022-09-21 NOTE — Telephone Encounter (Addendum)
Prior Auth (EOC) ID: [id number] Drug/Service Name: Adriana Mccallum 2.5 MG/0.5 ML PEN Patient: Albert Hogan Date Requested: 09/21/2022 11:33:28 AM   MemberID: [id number] DOB: 07/10/1971 Denied  Weightloss medication are not a covered benefit      Prior Auth (EOC) ID: [id number] Drug/Service Name: CICLOPIROX 8% SOLUTION Patient: Albert Hogan Date Requested: 09/21/2022 11:38:01 AM   MemberID: [id number] DOB: 08/02/1971 Approved through 09/22/23 [employer]

## 2022-09-27 ENCOUNTER — Encounter: Payer: Self-pay | Admitting: Family Medicine

## 2022-09-28 ENCOUNTER — Other Ambulatory Visit: Payer: Self-pay | Admitting: Family Medicine

## 2022-10-10 ENCOUNTER — Other Ambulatory Visit: Payer: Self-pay | Admitting: Family Medicine

## 2022-10-10 DIAGNOSIS — M170 Bilateral primary osteoarthritis of knee: Secondary | ICD-10-CM

## 2022-10-11 MED ORDER — TRAMADOL HCL 50 MG PO TABS: ORAL_TABLET | ORAL | 0 refills | Status: DC

## 2022-10-11 MED ORDER — TADALAFIL 20 MG PO TABS: 20.0000 mg | ORAL_TABLET | Freq: Every day | ORAL | 0 refills | Status: DC | PRN

## 2022-10-11 MED ORDER — CICLOPIROX 8 % EX SOLN: Freq: Every day | CUTANEOUS | 0 refills | Status: DC

## 2022-10-24 ENCOUNTER — Other Ambulatory Visit: Payer: Self-pay | Admitting: Family Medicine

## 2022-10-24 DIAGNOSIS — E782 Mixed hyperlipidemia: Secondary | ICD-10-CM

## 2022-10-24 DIAGNOSIS — I10 Essential (primary) hypertension: Secondary | ICD-10-CM

## 2022-12-05 ENCOUNTER — Other Ambulatory Visit: Payer: Self-pay

## 2022-12-05 MED ORDER — CICLOPIROX 8 % EX SOLN: Freq: Every day | CUTANEOUS | 0 refills | Status: DC

## 2022-12-08 ENCOUNTER — Other Ambulatory Visit: Payer: Self-pay | Admitting: Family Medicine

## 2022-12-15 ENCOUNTER — Other Ambulatory Visit: Payer: Self-pay

## 2022-12-15 MED ORDER — CICLOPIROX 8 % EX SOLN: Freq: Every day | CUTANEOUS | 0 refills | Status: AC

## 2022-12-28 ENCOUNTER — Other Ambulatory Visit: Payer: Self-pay | Admitting: Family Medicine

## 2022-12-28 DIAGNOSIS — M170 Bilateral primary osteoarthritis of knee: Secondary | ICD-10-CM

## 2023-01-03 ENCOUNTER — Encounter: Payer: Self-pay | Admitting: Family Medicine

## 2023-01-03 ENCOUNTER — Ambulatory Visit (INDEPENDENT_AMBULATORY_CARE_PROVIDER_SITE_OTHER): Payer: PRIVATE HEALTH INSURANCE | Admitting: Family Medicine

## 2023-01-03 VITALS — BP 116/79 | HR 84 | Ht 73.0 in | Wt 269.0 lb

## 2023-01-03 DIAGNOSIS — Z0000 Encounter for general adult medical examination without abnormal findings: Secondary | ICD-10-CM

## 2023-01-03 DIAGNOSIS — M5136 Other intervertebral disc degeneration, lumbar region: Secondary | ICD-10-CM

## 2023-01-03 DIAGNOSIS — M545 Low back pain, unspecified: Secondary | ICD-10-CM

## 2023-01-03 DIAGNOSIS — Z125 Encounter for screening for malignant neoplasm of prostate: Secondary | ICD-10-CM

## 2023-01-03 DIAGNOSIS — E782 Mixed hyperlipidemia: Secondary | ICD-10-CM | POA: Diagnosis not present

## 2023-01-03 DIAGNOSIS — I10 Essential (primary) hypertension: Secondary | ICD-10-CM | POA: Diagnosis not present

## 2023-01-03 DIAGNOSIS — G8929 Other chronic pain: Secondary | ICD-10-CM

## 2023-01-03 DIAGNOSIS — F341 Dysthymic disorder: Secondary | ICD-10-CM

## 2023-01-03 MED ORDER — VORTIOXETINE HBR 10 MG PO TABS: 10.0000 mg | ORAL_TABLET | Freq: Every day | ORAL | 1 refills | Status: DC

## 2023-01-03 NOTE — Patient Instructions (Signed)

## 2023-01-03 NOTE — Progress Notes (Signed)
Albert Hogan - 52 y.o. male MRN [id number]  Date of birth: May 13, 1971  Subjective Chief Complaint  Patient presents with   Annual Exam    HPI Albert Hogan is a 52 year old male here today for annual exam.  Doing pretty well at this time.  He has started an injectable GLP-1 in the form of compounded semaglutide help with weight management.  He is getting this prescribed through a weight loss clinic.  So far he is tolerating this pretty well.    He has some chronic low back pain.  Previously seen by physical therapy and eventually had injections which seemed to help.  He would like to try PT again.  He is having some difficulty with reaching orgasm with sertraline.  He does feel he needs to continue on something for anxiety.  He has been a little more active.  He is making dietary changes.  He is a non-smoker.  Rare alcohol use  Review of Systems  Constitutional:  Negative for chills, fever, malaise/fatigue and weight loss.  HENT:  Negative for congestion, ear pain and sore throat.   Eyes:  Negative for blurred vision, double vision and pain.  Respiratory:  Negative for cough and shortness of breath.   Cardiovascular:  Negative for chest pain and palpitations.  Gastrointestinal:  Negative for abdominal pain, blood in stool, constipation, heartburn and nausea.  Genitourinary:  Negative for dysuria and urgency.  Musculoskeletal:  Negative for joint pain and myalgias.  Neurological:  Negative for dizziness and headaches.  Endo/Heme/Allergies:  Does not bruise/bleed easily.  Psychiatric/Behavioral:  Negative for depression. The patient is not nervous/anxious and does not have insomnia.      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 Dr Juliann Pulse [facility] [place]    Social History   Socioeconomic History   Marital status: Single    Spouse name: Not on file   Number of children: Not on file   Years of education: Not on file   Highest education level: Not on file  Occupational History   Not on file  Tobacco Use   Smoking status: Never   Smokeless tobacco: Never   Tobacco comments:    Occ cigar- maybe 3 x a year   Vaping Use   Vaping Use: Never used  Substance and Sexual Activity   Alcohol use: Yes    Comment: Occ- socially   Drug use: No   Sexual activity: Not on file  Other Topics Concern   Not on file  Social History Narrative   Not on file   Social Determinants of Health   Financial Resource Strain: Not on file  Food Insecurity: Not on file  Transportation Needs: Not on file  Physical Activity: Insufficiently Active (02/01/2018)   Exercise Vital Sign    Days of Exercise per Week: 4 days    Minutes of  Exercise per Session: 30 min  Stress: Not on file  Social Connections: Not on file    Family History  Problem Relation Age of Onset   Heart disease Mother    Hypertension Mother    Thyroid disease Mother    Colon polyps Father    Hypertension Father    Congestive Heart Failure Father    Colon cancer Neg Hx    Esophageal cancer Neg Hx    Rectal cancer Neg Hx    Stomach cancer Neg Hx     Health Maintenance  Topic Date Due   DTaP/Tdap/Td (7 - Td or Tdap) 08/15/2021   COVID-19 Vaccine (3 - 2023-24 season) 09/30/2023 (Originally 04/15/2022)   Zoster Vaccines- Shingrix (1 of 2) 12/13/2023 (Originally 07/11/2021)   INFLUENZA VACCINE  03/16/2023   COLONOSCOPY (Pts 45-51yrs  Insurance coverage will need to be confirmed)  04/15/2024   Hepatitis C Screening  Completed   HIV Screening  Completed   HPV VACCINES  Aged Out     ----------------------------------------------------------------------------------------------------------------------------------------------------------------------------------------------------------------- Physical Exam BP 116/79 (BP Location: Left Arm, Patient Position: Sitting, Cuff Size: Large)   Pulse 84   Ht 6\' 1"  (1.854 m)   Wt 269 lb (122 kg)   SpO2 100%   BMI 35.49 kg/m   Physical Exam Constitutional:      General: He is not in acute distress. HENT:     Head: Normocephalic and atraumatic.     Right Ear: Tympanic membrane and external ear normal.     Left Ear: Tympanic membrane and external ear normal.  Eyes:     General: No scleral icterus. Neck:     Thyroid: No thyromegaly.  Cardiovascular:     Rate and Rhythm: Normal rate and regular rhythm.     Heart sounds: Normal heart sounds.  Pulmonary:     Effort: Pulmonary effort is normal.     Breath sounds: Normal breath sounds.  Abdominal:     General: Bowel sounds are normal. There is no distension.     Palpations: Abdomen is soft.     Tenderness: There is no abdominal tenderness. There is no guarding.  Musculoskeletal:     Cervical back: Normal range of motion.  Lymphadenopathy:     Cervical: No cervical adenopathy.  Skin:    General: Skin is warm and dry.     Findings: No rash.  Neurological:     Mental Status: He is alert and oriented to person, place, and time.     Cranial Nerves: No cranial nerve deficit.     Motor: No abnormal muscle tone.  Psychiatric:        Mood and Affect: Mood normal.        Behavior: Behavior normal.     ------------------------------------------------------------------------------------------------------------------------------------------------------------------------------------------------------------------- Assessment and  Plan  Degenerative disc disease, lumbar Adding physical therapy back on.  May need to get repeat imaging if not improving.  Dysthymia He is having some difficulty with reaching orgasm with sertraline.  Will try Trintellix.  Well adult exam Well adult Orders Placed This Encounter  Procedures   COMPLETE METABOLIC PANEL WITH GFR   CBC with Differential   Lipid Panel w/reflex Direct LDL   TSH   PSA   Ambulatory referral to Physical Therapy    Referral Priority:   Routine    Referral Type:   Physical Medicine    Referral Reason:   Specialty Services Required    Requested Specialty:   Physical Therapy    Number of Visits Requested:  1  Screenings: Per lab orders Immunizations: Declines Shingrix at this time Anticipatory guidance/risk factor reduction: Recommendations per AVS.   Meds ordered this encounter  Medications   vortioxetine HBr (TRINTELLIX) 10 MG TABS tablet    Sig: Take 1 tablet (10 mg total) by mouth daily.    Dispense:  90 tablet    Refill:  1    No follow-ups on file.    This visit occurred during the SARS-CoV-2 public health emergency.  Safety protocols were in place, including screening questions prior to the visit, additional usage of staff PPE, and extensive cleaning of exam room while observing appropriate contact time as indicated for disinfecting solutions.

## 2023-01-03 NOTE — Assessment & Plan Note (Signed)
Well adult Orders Placed This Encounter  Procedures   COMPLETE METABOLIC PANEL WITH GFR   CBC with Differential   Lipid Panel w/reflex Direct LDL   TSH   PSA   Ambulatory referral to Physical Therapy    Referral Priority:   Routine    Referral Type:   Physical Medicine    Referral Reason:   Specialty Services Required    Requested Specialty:   Physical Therapy    Number of Visits Requested:   1  Screenings: Per lab orders Immunizations: Declines Shingrix at this time Anticipatory guidance/risk factor reduction: Recommendations per AVS.

## 2023-01-03 NOTE — Assessment & Plan Note (Signed)
He is having some difficulty with reaching orgasm with sertraline.  Will try Trintellix.

## 2023-01-03 NOTE — Assessment & Plan Note (Signed)
Adding physical therapy back on.  May need to get repeat imaging if not improving.

## 2023-01-11 LAB — CBC WITH DIFFERENTIAL/PLATELET
Basophils Absolute: 21 cells/uL (ref 0–200)
HCT: 42.5 % (ref 38.5–50.0)
Hemoglobin: 14.2 g/dL (ref 13.2–17.1)
MCHC: 33.4 g/dL (ref 32.0–36.0)
MPV: 12.4 fL (ref 7.5–12.5)
Neutrophils Relative %: 61.2 %
RBC: 4.79 10*6/uL (ref 4.20–5.80)
WBC: 6.9 10*3/uL (ref 3.8–10.8)

## 2023-01-12 LAB — COMPLETE METABOLIC PANEL WITH GFR
AG Ratio: 1.7 (calc) (ref 1.0–2.5)
ALT: 19 U/L (ref 9–46)
AST: 20 U/L (ref 10–35)
Albumin: 4.6 g/dL (ref 3.6–5.1)
Alkaline phosphatase (APISO): 75 U/L (ref 35–144)
BUN/Creatinine Ratio: 18 (calc) (ref 6–22)
BUN: 24 mg/dL (ref 7–25)
CO2: 29 mmol/L (ref 20–32)
Calcium: 9.5 mg/dL (ref 8.6–10.3)
Chloride: 100 mmol/L (ref 98–110)
Creat: 1.31 mg/dL — ABNORMAL HIGH (ref 0.70–1.30)
Globulin: 2.7 g/dL (calc) (ref 1.9–3.7)
Glucose, Bld: 95 mg/dL (ref 65–99)
Potassium: 3.7 mmol/L (ref 3.5–5.3)
Sodium: 138 mmol/L (ref 135–146)
Total Bilirubin: 0.7 mg/dL (ref 0.2–1.2)
Total Protein: 7.3 g/dL (ref 6.1–8.1)
eGFR: 66 mL/min/{1.73_m2} (ref >=60)

## 2023-01-12 LAB — LIPID PANEL W/REFLEX DIRECT LDL
Cholesterol: 144 mg/dL (ref <200)
HDL: 52 mg/dL (ref >=40)
LDL Cholesterol (Calc): 76 mg/dL (calc)
Non-HDL Cholesterol (Calc): 92 mg/dL (calc) (ref <130)
Total CHOL/HDL Ratio: 2.8 (calc) (ref <5.0)
Triglycerides: 79 mg/dL (ref <150)

## 2023-01-12 LAB — CBC WITH DIFFERENTIAL/PLATELET
Absolute Monocytes: 511 cells/uL (ref 200–950)
Basophils Relative: 0.3 %
Eosinophils Absolute: 117 cells/uL (ref 15–500)
Eosinophils Relative: 1.7 %
Lymphs Abs: 2029 cells/uL (ref 850–3900)
MCH: 29.6 pg (ref 27.0–33.0)
MCV: 88.7 fL (ref 80.0–100.0)
Monocytes Relative: 7.4 %
Neutro Abs: 4223 cells/uL (ref 1500–7800)
Platelets: 202 10*3/uL (ref 140–400)
RDW: 13.7 % (ref 11.0–15.0)
Total Lymphocyte: 29.4 %

## 2023-01-12 LAB — PSA: PSA: 0.41 ng/mL (ref <=4.00)

## 2023-01-12 LAB — TSH: TSH: 2 mIU/L (ref 0.40–4.50)

## 2023-01-17 ENCOUNTER — Other Ambulatory Visit: Payer: Self-pay | Admitting: Family Medicine

## 2023-01-17 DIAGNOSIS — E782 Mixed hyperlipidemia: Secondary | ICD-10-CM

## 2023-01-25 ENCOUNTER — Other Ambulatory Visit: Payer: Self-pay | Admitting: Family Medicine

## 2023-01-25 DIAGNOSIS — I10 Essential (primary) hypertension: Secondary | ICD-10-CM

## 2023-02-09 ENCOUNTER — Other Ambulatory Visit: Payer: Self-pay | Admitting: Family Medicine

## 2023-02-09 DIAGNOSIS — M170 Bilateral primary osteoarthritis of knee: Secondary | ICD-10-CM

## 2023-03-08 ENCOUNTER — Other Ambulatory Visit: Payer: Self-pay | Admitting: Family Medicine

## 2023-03-08 DIAGNOSIS — M170 Bilateral primary osteoarthritis of knee: Secondary | ICD-10-CM

## 2023-03-09 ENCOUNTER — Other Ambulatory Visit: Payer: Self-pay | Admitting: Family Medicine

## 2023-03-09 DIAGNOSIS — M170 Bilateral primary osteoarthritis of knee: Secondary | ICD-10-CM

## 2023-03-09 MED ORDER — TRAMADOL HCL 50 MG PO TABS
ORAL_TABLET | ORAL | 0 refills | Status: DC
Start: 2023-03-09 — End: 2023-04-13

## 2023-04-03 ENCOUNTER — Other Ambulatory Visit: Payer: Self-pay | Admitting: Family Medicine

## 2023-04-13 ENCOUNTER — Other Ambulatory Visit: Payer: Self-pay | Admitting: Family Medicine

## 2023-04-13 DIAGNOSIS — M170 Bilateral primary osteoarthritis of knee: Secondary | ICD-10-CM

## 2023-04-14 ENCOUNTER — Other Ambulatory Visit: Payer: Self-pay | Admitting: Family Medicine

## 2023-04-14 DIAGNOSIS — M170 Bilateral primary osteoarthritis of knee: Secondary | ICD-10-CM

## 2023-04-21 ENCOUNTER — Other Ambulatory Visit: Payer: Self-pay | Admitting: Family Medicine

## 2023-04-21 DIAGNOSIS — I10 Essential (primary) hypertension: Secondary | ICD-10-CM

## 2023-04-21 DIAGNOSIS — E782 Mixed hyperlipidemia: Secondary | ICD-10-CM

## 2023-06-01 ENCOUNTER — Other Ambulatory Visit: Payer: Self-pay | Admitting: Family Medicine

## 2023-06-01 DIAGNOSIS — M170 Bilateral primary osteoarthritis of knee: Secondary | ICD-10-CM

## 2023-06-01 NOTE — Telephone Encounter (Signed)
Requesting rx rf of tramadol 50mg  Last written  04/13/2023 Last OV 01/03/2023 No upcoming appt schld.

## 2023-06-02 MED ORDER — TRAMADOL HCL 50 MG PO TABS
ORAL_TABLET | ORAL | 0 refills | Status: DC
Start: 2023-06-02 — End: 2023-07-10

## 2023-07-04 ENCOUNTER — Other Ambulatory Visit: Payer: Self-pay | Admitting: Family Medicine

## 2023-07-06 ENCOUNTER — Other Ambulatory Visit: Payer: Self-pay | Admitting: Family Medicine

## 2023-07-06 DIAGNOSIS — M170 Bilateral primary osteoarthritis of knee: Secondary | ICD-10-CM

## 2023-07-18 ENCOUNTER — Other Ambulatory Visit: Payer: Self-pay | Admitting: Family Medicine

## 2023-07-18 DIAGNOSIS — I10 Essential (primary) hypertension: Secondary | ICD-10-CM

## 2023-07-19 NOTE — Telephone Encounter (Signed)
Hold for 07/20/23 appt

## 2023-07-19 NOTE — Telephone Encounter (Signed)
Called patient left vm to call back and schedule a HTN follow-up with Dr. Ashley Royalty

## 2023-07-20 ENCOUNTER — Encounter: Payer: Self-pay | Admitting: Family Medicine

## 2023-07-20 ENCOUNTER — Ambulatory Visit (INDEPENDENT_AMBULATORY_CARE_PROVIDER_SITE_OTHER): Payer: PRIVATE HEALTH INSURANCE | Admitting: Family Medicine

## 2023-07-20 VITALS — BP 133/80 | HR 110 | Ht 73.0 in | Wt 280.0 lb

## 2023-07-20 DIAGNOSIS — F341 Dysthymic disorder: Secondary | ICD-10-CM

## 2023-07-20 DIAGNOSIS — E782 Mixed hyperlipidemia: Secondary | ICD-10-CM

## 2023-07-20 DIAGNOSIS — I10 Essential (primary) hypertension: Secondary | ICD-10-CM | POA: Diagnosis not present

## 2023-07-20 DIAGNOSIS — M170 Bilateral primary osteoarthritis of knee: Secondary | ICD-10-CM

## 2023-07-20 MED ORDER — TRAMADOL HCL 50 MG PO TABS: ORAL_TABLET | ORAL | 1 refills | Status: DC

## 2023-07-20 MED ORDER — VORTIOXETINE HBR 10 MG PO TABS: 10.0000 mg | ORAL_TABLET | Freq: Every day | ORAL | 1 refills | Status: DC

## 2023-07-20 MED ORDER — OLMESARTAN MEDOXOMIL-HCTZ 40-25 MG PO TABS: 1.0000 | ORAL_TABLET | Freq: Every day | ORAL | 0 refills | Status: DC

## 2023-07-20 NOTE — Assessment & Plan Note (Signed)
BP remains fairly well controlled.  Continue benicar-hct at current strength.

## 2023-07-20 NOTE — Assessment & Plan Note (Addendum)
Tolerating lipitor well, continue at this time.

## 2023-07-20 NOTE — Assessment & Plan Note (Signed)
He has tried multiple modalities for treatment.  Will continue OTC ibuprofen with tramadol as needed.  He will let me know when ready for orthopedic consultation.

## 2023-07-20 NOTE — Progress Notes (Signed)
Quamain Czarnecki - 52 y.o. male MRN [id number]  Date of birth: [birth year]  Subjective Chief Complaint  Patient presents with   Hypertension    HPI Algia Trapasso is a 52 y.o. male here today for follow up visit.   He reports that he is doing well  Remains on Benicar/hct for management of HTN.  BP is pretty well controlled.  Denies side effects with medication.  He has not had chest pain, shortness of breath, palpitations, headache or vision changes.  Tolerating atoravastatin well for HLD.    Trintellix is working well for him.  No side effects at this time.    Continues to have knee and lower back pain.  Has tried multiple modalities including Monovisc, Orthovisc, multiple steroid injections, activity modification, tramadol, bracing.  Tramadol remains pretty effective for him.    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 Dr Juliann Pulse [facility] [place]    Social History   Socioeconomic History   Marital status: Single    Spouse name: Not on file   Number of children: Not on file   Years of education: Not on file   Highest education level: Not on file  Occupational History   Not on file  Tobacco Use   Smoking status: Never   Smokeless tobacco: Never   Tobacco comments:    Occ cigar- maybe 3 x a year   Vaping Use   Vaping status:  Never Used  Substance and Sexual Activity   Alcohol use: Yes    Comment: Occ- socially   Drug use: No   Sexual activity: Not on file  Other Topics Concern   Not on file  Social History Narrative   Not on file   Social Determinants of Health   Financial Resource Strain: Not on file  Food Insecurity: Not on file  Transportation Needs: Not on file  Physical Activity: Insufficiently Active (02/01/2018)   Exercise Vital Sign    Days of Exercise per Week: 4 days    Minutes of Exercise per Session: 30 min  Stress: Not on file  Social Connections: Unknown (12/26/2021)   [facility]   Social Network    Social Network: Not on file    Family History  Problem Relation Age of Onset   Heart disease Mother    Hypertension Mother    Thyroid disease Mother    Colon polyps Father    Hypertension Father    Congestive Heart Failure Father    Colon cancer Neg Hx    Esophageal cancer Neg Hx    Rectal cancer Neg Hx    Stomach cancer Neg Hx     Health Maintenance  Topic Date Due   DTaP/Tdap/Td (7 - Td or Tdap) 08/15/2021  COVID-19 Vaccine (3 - 2023-24 season) 04/16/2023   INFLUENZA VACCINE  11/13/2023 (Originally 03/16/2023)   Zoster Vaccines- Shingrix (1 of 2) 12/13/2023 (Originally 07/11/2021)   Colonoscopy  04/15/2024   Hepatitis C Screening  Completed   HIV Screening  Completed   HPV VACCINES  Aged Out     ----------------------------------------------------------------------------------------------------------------------------------------------------------------------------------------------------------------- Physical Exam BP 133/80   Pulse (!) 110   Ht 6\' 1"  (1.854 m)   Wt 280 lb (127 kg)   SpO2 98%   BMI 36.94 kg/m   Physical Exam Constitutional:      Appearance: Normal appearance.  Cardiovascular:     Rate and Rhythm: Normal rate and regular rhythm.  Pulmonary:     Effort: Pulmonary effort is normal.     Breath sounds: Normal breath  sounds.  Neurological:     General: No focal deficit present.     Mental Status: He is alert.  Psychiatric:        Mood and Affect: Mood normal.        Behavior: Behavior normal.     ------------------------------------------------------------------------------------------------------------------------------------------------------------------------------------------------------------------- Assessment and Plan  Essential hypertension, benign BP remains fairly well controlled.  Continue benicar-hct at current strength.   Hyperlipidemia Tolerating lipitor well, continue at this time.    Primary osteoarthritis of both knees He has tried multiple modalities for treatment.  Will continue OTC ibuprofen with tramadol as needed.  He will let me know when ready for orthopedic consultation.    Dysthymia Doing well with trintellix.  Continue at current strength.    Meds ordered this encounter  Medications   olmesartan-hydrochlorothiazide (BENICAR HCT) 40-25 MG tablet    Sig: Take 1 tablet by mouth daily.    Dispense:  90 tablet    Refill:  0   traMADol (ULTRAM) 50 MG tablet    Sig: TAKE 1 TABLET BY MOUTH EVERY 12 HOURS AS NEEDED FOR MODERATE PAIN    Dispense:  30 tablet    Refill:  1   vortioxetine HBr (TRINTELLIX) 10 MG TABS tablet    Sig: Take 1 tablet (10 mg total) by mouth daily.    Dispense:  90 tablet    Refill:  1    Return in about 6 months (around 01/18/2024) for Hypertension.    This visit occurred during the SARS-CoV-2 public health emergency.  Safety protocols were in place, including screening questions prior to the visit, additional usage of staff PPE, and extensive cleaning of exam room while observing appropriate contact time as indicated for disinfecting solutions.

## 2023-07-20 NOTE — Assessment & Plan Note (Signed)
Doing well with trintellix.  Continue at current strength.

## 2023-08-18 ENCOUNTER — Other Ambulatory Visit: Payer: Self-pay | Admitting: Family Medicine

## 2023-09-21 ENCOUNTER — Telehealth: Payer: PRIVATE HEALTH INSURANCE | Admitting: Physician Assistant

## 2023-09-21 DIAGNOSIS — J069 Acute upper respiratory infection, unspecified: Secondary | ICD-10-CM | POA: Diagnosis not present

## 2023-09-21 MED ORDER — AZITHROMYCIN 250 MG PO TABS: ORAL_TABLET | ORAL | 0 refills | Status: AC

## 2023-09-21 MED ORDER — PROMETHAZINE-DM 6.25-15 MG/5ML PO SYRP: 5.0000 mL | ORAL_SOLUTION | Freq: Four times a day (QID) | ORAL | 0 refills | Status: DC | PRN

## 2023-09-21 NOTE — Patient Instructions (Signed)
 Marsha Das, thank you for joining [name], PA-C for today's virtual visit.  While this provider is not your primary care provider (PCP), if your PCP is located in our provider database this encounter information will be shared with them immediately following your visit.   A [place] MyChart account gives you access to today's visit and all your visits, tests, and labs performed at [facility]  click here if you don't have a [place] MyChart account or go to [email or web address]  Consent: (Patient) Albert Hogan provided verbal consent for this virtual visit at the beginning of the encounter.  Current Medications:  Current Outpatient Medications:    atorvastatin  (LIPITOR) 20 MG tablet, Take 1 tablet by mouth once daily, Disp: 90 tablet, Rfl: 2   brimonidine (ALPHAGAN) 0.2 % ophthalmic solution, 1 drop 2 (two) times daily., Disp: , Rfl:    ciclopirox  (PENLAC ) 8 % solution, Apply topically at bedtime. Apply over nail and surrounding skin. Apply daily over previous coat. After seven (7) days, may remove with alcohol and continue cycle., Disp: 6.6 mL, Rfl: 0   latanoprost (XALATAN) 0.005 % ophthalmic solution, 1 drop at bedtime., Disp: , Rfl:    Multiple Vitamin (MULTIVITAMIN) tablet, Take 1 tablet by mouth daily., Disp: , Rfl:    NONFORMULARY OR COMPOUNDED ITEM, Semaglutide - B12, Disp: , Rfl:    olmesartan -hydrochlorothiazide  (BENICAR  HCT) 40-25 MG tablet, Take 1 tablet by mouth daily., Disp: 90 tablet, Rfl: 0   tadalafil  (CIALIS ) 20 MG tablet, TAKE 1 TABLET BY MOUTH ONCE DAILY AS NEEDED, Disp: 6 tablet, Rfl: 0   traMADol  (ULTRAM ) 50 MG tablet, TAKE 1 TABLET BY MOUTH EVERY 12 HOURS AS NEEDED FOR MODERATE PAIN, Disp: 30 tablet, Rfl: 1   vortioxetine  HBr (TRINTELLIX ) 10 MG TABS tablet, Take 1 tablet (10 mg total) by mouth daily., Disp: 90 tablet, Rfl: 1   Medications ordered in this encounter:  No orders of the defined types were placed in this encounter.     *If you need refills on other medications prior to your next appointment, please contact your pharmacy*  Follow-Up: Call back or seek an in-person evaluation if the symptoms worsen or if the condition fails to improve as anticipated.  [facility] [phone]  Other Instructions  Increase fluids.  Get plenty of rest. Use Mucinex for congestion. Use the prescription cough medications as directed.. Take a daily probiotic (I recommend Align or Culturelle, but even Activia Yogurt may be beneficial).  A humidifier placed in the bedroom may offer some relief for a dry, scratchy throat of nasal irritation.  Read information below on acute bronchitis.   If no substantial improvement over next 48 hours or any recurrence of high fever, please take the antibiotic as directed. Please call or return to clinic if symptoms are not improving.  Acute Bronchitis Bronchitis is when the airways that extend from the windpipe into the lungs get red, puffy, and painful (inflamed). Bronchitis often causes thick spit (mucus) to develop. This leads to a cough. A cough is the most common symptom of bronchitis. In acute bronchitis, the condition usually begins suddenly and goes away over time (usually in 2 weeks). Smoking, allergies, and asthma can make bronchitis worse. Repeated episodes of bronchitis may cause more lung problems.  HOME CARE Rest. Drink enough fluids to keep your pee (urine) clear or pale yellow (unless you need to limit fluids as told by your doctor). Only take over-the-counter or prescription medicines as told by your  doctor. Avoid smoking and secondhand smoke. These can make bronchitis worse. If you are a smoker, think about using nicotine gum or skin patches. Quitting smoking will help your lungs heal faster. Reduce the chance of getting bronchitis again by: Washing your hands often. Avoiding people with cold symptoms. Trying not to touch your hands to your mouth, nose, or  eyes. Follow up with your doctor as told.  GET HELP IF: Your symptoms do not improve after 1 week of treatment. Symptoms include: Cough. Fever. Coughing up thick spit. Body aches. Chest congestion. Chills. Shortness of breath. Sore throat.  GET HELP RIGHT AWAY IF:  You have an increased fever. You have chills. You have severe shortness of breath. You have bloody thick spit (sputum). You throw up (vomit) often. You lose too much body fluid (dehydration). You have a severe headache. You faint.  MAKE SURE YOU:  Understand these instructions. Will watch your condition. Will get help right away if you are not doing well or get worse. Document Released: 01/18/2008 Document Revised: 04/03/2013 Document Reviewed: 01/22/2013 [facility] Patient Information 2015 [place], [place]. This information is not intended to replace advice given to you by your health care provider. Make sure you discuss any questions you have with your health care provider.    If you have been instructed to have an in-person evaluation today at a local Urgent Care facility, please use the link below. It will take you to a list of all of our available [facility], including address, phone number and hours of operation. Please do not delay care.  [facility]  If you or a family member do not have a primary care provider, use the link below to schedule a visit and establish care. When you choose a [facility] primary care physician or advanced practice provider, you gain a long-term partner in health. Find a Primary Care Provider  Learn more about [facility]'s in-office and virtual care options: [place] - Get Care Now

## 2023-09-21 NOTE — Progress Notes (Signed)
 Virtual Visit Consent   Albert Hogan, you are scheduled for a virtual visit with a [facility] provider today. Just as with appointments in the office, your consent must be obtained to participate. Your consent will be active for this visit and any virtual visit you may have with one of our providers in the next 365 days. If you have a MyChart account, a copy of this consent can be sent to you electronically.  As this is a virtual visit, video technology does not allow for your provider to perform a traditional examination. This may limit your provider's ability to fully assess your condition. If your provider identifies any concerns that need to be evaluated in person or the need to arrange testing (such as labs, EKG, etc.), we will make arrangements to do so. Although advances in technology are sophisticated, we cannot ensure that it will always work on either your end or our end. If the connection with a video visit is poor, the visit may have to be switched to a telephone visit. With either a video or telephone visit, we are not always able to ensure that we have a secure connection.  By engaging in this virtual visit, you consent to the provision of healthcare and authorize for your insurance to be billed (if applicable) for the services provided during this visit. Depending on your insurance coverage, you may receive a charge related to this service.  I need to obtain your verbal consent now. Are you willing to proceed with your visit today? Albert Hogan has provided verbal consent on 09/21/2023 for a virtual visit (video or telephone). Albert Hogan, [place]  Date: 09/21/2023 4:33 PM  Virtual Visit via Video Note   I, Albert Hogan, connected with  Albert Hogan  ([id number], 06/04/1971) on 09/21/23 at  4:30 PM EST by a video-enabled telemedicine application and verified that I am speaking with the correct person using two identifiers.  Location: Patient: Virtual Visit Location  Patient: Home Provider: Virtual Visit Location Provider: Home Office   I discussed the limitations of evaluation and management by telemedicine and the availability of in person appointments. The patient expressed understanding and agreed to proceed.    History of Present Illness: Albert Hogan is a 53 y.o. who identifies as a male who was assigned male at birth, and is being seen today for 4 days of URI symptoms starting with fatigue, sore throat, nasal congestion, cough that is dry. Has noted a fever intermittently over the past 3 days. Tmax 104.5. Noted initially aches and chills with symptoms that have resolved. Chest tenderness with cough.  Has taken a home COVID/Flu test which was negative.  Was in a very large crowd of people a few days before symptom onset.  OTC -- Nyquil, Tylenol , Alka Seltzer.  HPI: HPI  Problems:  Patient Active Problem List   Diagnosis Date Noted   Onychomycosis of great toe 09/13/2022   Dysthymia 03/02/2022   Right shoulder pain 03/02/2022   Well adult exam 02/03/2021   Primary osteoarthritis of both knees 04/13/2018   Low libido 02/01/2018   Depression, major, single episode, complete remission (HCC) 02/01/2018   Lumbosacral radiculopathy at S1 11/16/2017   Lumbar strain 09/21/2017   Morbid obesity (HCC) 04/21/2017   Anxiety 01/26/2017   Osteochondral defect of femoral condyle 12/11/2015   Erectile dysfunction 10/14/2014   Scrotal mass 10/24/2013   Hyperlipidemia 09/18/2013   Degenerative disc disease, lumbar 05/15/2013   Left carpal tunnel syndrome 12/27/2012   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   Essential hypertension, benign 12/18/2008    Allergies:  Allergies  Allergen Reactions   Shellfish Allergy Anaphylaxis   Medications:  Current Outpatient Medications:    azithromycin  (ZITHROMAX ) 250 MG tablet, Take 2 tablets on day 1, then 1 tablet daily on days 2 through 5, Disp: 6 tablet, Rfl: 0   promethazine -dextromethorphan (PROMETHAZINE -DM) 6.25-15  MG/5ML syrup, Take 5 mLs by mouth 4 (four) times daily as needed for cough., Disp: 118 mL, Rfl: 0   atorvastatin  (LIPITOR) 20 MG tablet, Take 1 tablet by mouth once daily, Disp: 90 tablet, Rfl: 2   brimonidine (ALPHAGAN) 0.2 % ophthalmic solution, 1 drop 2 (two) times daily., Disp: , Rfl:    ciclopirox  (PENLAC ) 8 % solution, Apply topically at bedtime. Apply over nail and surrounding skin. Apply daily over previous coat. After seven (7) days, may remove with alcohol and continue cycle., Disp: 6.6 mL, Rfl: 0   latanoprost (XALATAN) 0.005 % ophthalmic solution, 1 drop at bedtime., Disp: , Rfl:    Multiple Vitamin (MULTIVITAMIN) tablet, Take 1 tablet by mouth daily., Disp: , Rfl:    NONFORMULARY OR COMPOUNDED ITEM, Semaglutide - B12, Disp: , Rfl:    olmesartan -hydrochlorothiazide  (BENICAR  HCT) 40-25 MG tablet, Take 1 tablet by mouth daily., Disp: 90 tablet, Rfl: 0   tadalafil  (CIALIS ) 20 MG tablet, TAKE 1 TABLET BY MOUTH ONCE DAILY AS NEEDED, Disp: 6 tablet, Rfl: 0   traMADol  (ULTRAM ) 50 MG tablet, TAKE 1 TABLET BY MOUTH EVERY 12 HOURS AS NEEDED FOR MODERATE PAIN, Disp: 30 tablet, Rfl: 1   vortioxetine  HBr (TRINTELLIX ) 10 MG TABS tablet, Take 1 tablet (10 mg total) by mouth daily., Disp: 90 tablet, Rfl: 1  Observations/Objective: Patient is well-developed, well-nourished in no acute distress.  Resting comfortably at home.  Head is normocephalic, atraumatic.  No labored breathing. Speech is clear and coherent with logical content.  Patient is alert and oriented at baseline.   Assessment and Plan: 1. Upper respiratory tract infection, unspecified type (Primary) - promethazine -dextromethorphan (PROMETHAZINE -DM) 6.25-15 MG/5ML syrup; Take 5 mLs by mouth 4 (four) times daily as needed for cough.  Dispense: 118 mL; Refill: 0 - azithromycin  (ZITHROMAX ) 250 MG tablet; Take 2 tablets on day 1, then 1 tablet daily on days 2 through 5  Dispense: 6 tablet; Refill: 0  Discussed concern for false negative  flu testing giving classic symptoms and high initial fever. Outside of antiviral window. Supportive measures and OTC medications reviewed. Will add on Promethazine -DM to further help with cough. Want him to monitor symptoms over next 48 hours. If not starting to significantly improve, any worsening symptoms or recurrence of high fever, will have him start Azithromycin  to cover for secondary bronchitis/pneumonia. Strict in person evaluation precautions reviewed.   Follow Up Instructions: I discussed the assessment and treatment plan with the patient. The patient was provided an opportunity to ask questions and all were answered. The patient agreed with the plan and demonstrated an understanding of the instructions.  A copy of instructions were sent to the patient via MyChart unless otherwise noted below.   The patient was advised to call back or seek an in-person evaluation if the symptoms worsen or if the condition fails to improve as anticipated.    Albert Velma Lunger, PA-C

## 2023-10-12 ENCOUNTER — Other Ambulatory Visit: Payer: Self-pay | Admitting: Family Medicine

## 2023-10-17 ENCOUNTER — Other Ambulatory Visit: Payer: Self-pay | Admitting: Family Medicine

## 2023-10-17 DIAGNOSIS — I10 Essential (primary) hypertension: Secondary | ICD-10-CM

## 2023-12-08 ENCOUNTER — Other Ambulatory Visit: Payer: Self-pay | Admitting: Family Medicine

## 2023-12-08 MED ORDER — TADALAFIL 20 MG PO TABS: 20.0000 mg | ORAL_TABLET | Freq: Every day | ORAL | 0 refills | Status: DC | PRN

## 2023-12-13 ENCOUNTER — Other Ambulatory Visit: Payer: Self-pay | Admitting: Family Medicine

## 2023-12-13 DIAGNOSIS — M170 Bilateral primary osteoarthritis of knee: Secondary | ICD-10-CM

## 2024-01-10 ENCOUNTER — Other Ambulatory Visit: Payer: Self-pay | Admitting: Family Medicine

## 2024-01-10 DIAGNOSIS — E782 Mixed hyperlipidemia: Secondary | ICD-10-CM

## 2024-01-17 ENCOUNTER — Other Ambulatory Visit: Payer: Self-pay | Admitting: Family Medicine

## 2024-01-17 DIAGNOSIS — I10 Essential (primary) hypertension: Secondary | ICD-10-CM

## 2024-01-18 ENCOUNTER — Ambulatory Visit: Payer: PRIVATE HEALTH INSURANCE | Admitting: Family Medicine

## 2024-01-25 ENCOUNTER — Other Ambulatory Visit: Payer: Self-pay | Admitting: Family Medicine

## 2024-01-25 ENCOUNTER — Ambulatory Visit: Payer: PRIVATE HEALTH INSURANCE | Admitting: Family Medicine

## 2024-01-25 DIAGNOSIS — M170 Bilateral primary osteoarthritis of knee: Secondary | ICD-10-CM

## 2024-01-26 NOTE — Telephone Encounter (Signed)
 Last OV: 07/20/23 Last fill: 12/15/23  Quantity: 30 tablet Refills: 0

## 2024-02-27 ENCOUNTER — Other Ambulatory Visit: Payer: Self-pay | Admitting: Family Medicine

## 2024-02-27 DIAGNOSIS — M170 Bilateral primary osteoarthritis of knee: Secondary | ICD-10-CM

## 2024-03-25 ENCOUNTER — Other Ambulatory Visit: Payer: Self-pay | Admitting: Family Medicine

## 2024-03-25 DIAGNOSIS — M170 Bilateral primary osteoarthritis of knee: Secondary | ICD-10-CM

## 2024-03-27 ENCOUNTER — Other Ambulatory Visit: Payer: Self-pay

## 2024-03-27 DIAGNOSIS — M170 Bilateral primary osteoarthritis of knee: Secondary | ICD-10-CM

## 2024-03-28 ENCOUNTER — Ambulatory Visit: Admitting: Family Medicine

## 2024-03-28 DIAGNOSIS — M170 Bilateral primary osteoarthritis of knee: Secondary | ICD-10-CM

## 2024-03-28 MED ORDER — TRAMADOL HCL 50 MG PO TABS: ORAL_TABLET | ORAL | 1 refills | Status: DC

## 2024-03-28 NOTE — Progress Notes (Signed)
 Albert Hogan - 53 y.o. male MRN [id number]  Date of birth: 1970/12/10  Subjective Chief Complaint  Patient presents with   Medical Management of Chronic Issues    Med management     HPI Albert Hogan is a 53 y.o. male here today for follow up of chronic knee and back pain.  He has knee pain with history of multipile surgeries ont he R knee.  Using tramadol as needed and needs a refill of this.  He uses tramadol about 3x per week when exercising or playing golf.  He gets pretty good relief with this.  He is taking ibuprofen as well.  He is considering having knee replacement.   ROS:  A comprehensive ROS was completed and negative except as noted per HPI  Allergies  Allergen Reactions   Shellfish Allergy Anaphylaxis    Past Medical History:  Diagnosis Date   Allergy 2010   mild and not consistent   Anxiety 01/26/2017   Arthritis    knees   Depression, major, single episode, complete remission (HCC) 02/01/2018   ED (erectile dysfunction)    Glaucoma    Hypertension Jan. 2003   Morbid obesity (HCC) 04/21/2017    Past Surgical History:  Procedure Laterality Date   APPENDECTOMY  2001   CIRCUMCISION     age 65   HERNIA REPAIR  1998   KNEE SURGERY Right 11/27/2015   Partial medial and lateral meniscetomy and microfracture lateral femoral condyle. Dr Lynwood Glatter [facility] [place]    Social History   Socioeconomic History   Marital status: Single    Spouse name: Not on file   Number of children: Not on file   Years of education: Not on file   Highest education level: Bachelor's degree (e.g., BA, AB, BS)  Occupational History   Not on file  Tobacco Use   Smoking status: Never   Smokeless tobacco: Never   Tobacco comments:    Occ cigar- maybe 3 x a year   Vaping Use   Vaping status: Never Used  Substance and Sexual Activity   Alcohol use: Yes    Comment: Occ- socially   Drug use: No   Sexual activity: Not on file  Other Topics Concern   Not on  file  Social History Narrative   Not on file   Social Drivers of Health   Financial Resource Strain: Low Risk  (03/28/2024)   Overall Financial Resource Strain (CARDIA)    Difficulty of Paying Living Expenses: Not hard at all  Food Insecurity: No Food Insecurity (03/28/2024)   Hunger Vital Sign    Worried About Running Out of Food in the Last Year: Never true    Ran Out of Food in the Last Year: Never true  Transportation Needs: No Transportation Needs (03/28/2024)   PRAPARE - Administrator, Civil Service (Medical): No    Lack of Transportation (Non-Medical): No  Physical Activity: Insufficiently Active (03/28/2024)   Exercise Vital Sign    Days of Exercise per Week: 2 days    Minutes of Exercise per Session: 40 min  Stress: Stress Concern Present (03/28/2024)   [employer] of Occupational Health - Occupational Stress Questionnaire    Feeling of Stress: To some extent  Social Connections: Moderately Isolated (03/28/2024)   Social Connection and Isolation Panel    Frequency of Communication with Friends and Family: Twice a week    Frequency of Social Gatherings with Friends and Family: Once a week  Attends Religious Services: Never    Active Member of Clubs or Organizations: Yes    Attends Banker Meetings: 1 to 4 times per year    Marital Status: Divorced    Family History  Problem Relation Age of Onset   Heart disease Mother    Hypertension Mother    Thyroid disease Mother    Colon polyps Father    Hypertension Father    Congestive Heart Failure Father    Colon cancer Neg Hx    Esophageal cancer Neg Hx    Rectal cancer Neg Hx    Stomach cancer Neg Hx     Health Maintenance  Topic Date Due   Hepatitis B Vaccines 19-59 Average Risk (1 of 3 - 19+ 3-dose series) Never done   Pneumococcal Vaccine: 50+ Years (1 of 1 - PCV) Never done   Zoster Vaccines- Shingrix (1 of 2) Never done   DTaP/Tdap/Td (7 - Td or Tdap) 08/15/2021   COVID-19 Vaccine  (3 - 2024-25 season) 04/16/2023   INFLUENZA VACCINE  03/15/2024   Colonoscopy  04/15/2024   Hepatitis C Screening  Completed   HIV Screening  Completed   HPV VACCINES  Aged Out   Meningococcal B Vaccine  Aged Out     ----------------------------------------------------------------------------------------------------------------------------------------------------------------------------------------------------------------- Physical Exam BP 127/80   Pulse 97   Ht 6' 1 (1.854 m)   Wt 295 lb (133.8 kg)   SpO2 97%   BMI 38.92 kg/m   Physical Exam Constitutional:      Appearance: Normal appearance.  Eyes:     General: No scleral icterus. Cardiovascular:     Rate and Rhythm: Normal rate and regular rhythm.  Pulmonary:     Effort: Pulmonary effort is normal.     Breath sounds: Normal breath sounds.  Musculoskeletal:     Cervical back: Neck supple.  Neurological:     Mental Status: He is alert.     ------------------------------------------------------------------------------------------------------------------------------------------------------------------------------------------------------------------- Assessment and Plan  Primary osteoarthritis of both knees He has tried multiple modalities for treatment.  Will continue OTC ibuprofen with tramadol as needed.  He will let me know when ready for orthopedic consultation.     Meds ordered this encounter  Medications   traMADol (ULTRAM) 50 MG tablet    Sig: TAKE 1 TABLET BY MOUTH EVERY 12 HOURS AS NEEDED FOR MODERATE PAIN    Dispense:  90 tablet    Refill:  1    No follow-ups on file.

## 2024-03-28 NOTE — Assessment & Plan Note (Signed)
 He has tried multiple modalities for treatment.  Will continue OTC ibuprofen with tramadol as needed.  He will let me know when ready for orthopedic consultation.

## 2024-04-16 ENCOUNTER — Encounter: Payer: Self-pay | Admitting: Sports Medicine

## 2024-05-07 ENCOUNTER — Other Ambulatory Visit: Payer: Self-pay | Admitting: Family Medicine

## 2024-05-28 ENCOUNTER — Encounter: Payer: Self-pay | Admitting: Family Medicine

## 2024-05-28 ENCOUNTER — Ambulatory Visit: Admitting: Family Medicine

## 2024-05-28 VITALS — BP 139/75 | HR 97 | Ht 73.0 in | Wt 295.0 lb

## 2024-05-28 DIAGNOSIS — G8929 Other chronic pain: Secondary | ICD-10-CM | POA: Diagnosis not present

## 2024-05-28 DIAGNOSIS — M25561 Pain in right knee: Secondary | ICD-10-CM | POA: Diagnosis not present

## 2024-05-28 DIAGNOSIS — M170 Bilateral primary osteoarthritis of knee: Secondary | ICD-10-CM

## 2024-05-28 NOTE — Progress Notes (Signed)
 Albert Hogan - 53 y.o. male MRN [id number]  Date of birth: 1971-01-21  Subjective Chief Complaint  Patient presents with   Knee Pain    Would like to get a knee replacement    HPI Albert Hogan is 53 y.o. male here today for follow up of knee pain.  He has history of L knee pain.  Followed by Dr. Curtis previously.  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  He also has been having increased pain in his right knee.  He has also noticed increased swelling and stiffness associated with this.  He has has injections and previous arhroscopy with meniscectomy.  He is debating having evaluation for knee replacement.  He does have a program through his employer that allows him to have knee replacement at a significantly reduced cost.  He reports that this program will be ending at the end of the year.  ROS:  A comprehensive ROS was completed and negative except as noted per HPI  Allergies  Allergen Reactions   Shellfish Allergy Anaphylaxis    Past Medical History:  Diagnosis Date   Allergy 2010   mild and not consistent   Anxiety 01/26/2017   Arthritis    knees   Depression, major, single episode, complete remission 02/01/2018   ED (erectile dysfunction)    Glaucoma    Hypertension Jan. 2003   Morbid obesity (HCC) 04/21/2017    Past Surgical History:  Procedure Laterality Date   APPENDECTOMY  2001   CIRCUMCISION     age 72   HERNIA REPAIR  1998   KNEE SURGERY Right 11/27/2015   Partial medial and lateral meniscetomy and microfracture lateral femoral condyle. Dr Lynwood Glatter [facility] [place]    Social History   Socioeconomic History   Marital status: Single    Spouse name: Not on file   Number of children: Not on file   Years of education: Not on file   Highest education level: Bachelor's degree (e.g., BA, AB, BS)  Occupational History   Not on file  Tobacco Use   Smoking status: Never   Smokeless  tobacco: Never   Tobacco comments:    Occ cigar- maybe 3 x a year   Vaping Use   Vaping status: Never Used  Substance and Sexual Activity   Alcohol use: Yes    Comment: Occ- socially   Drug use: No   Sexual activity: Not on file  Other Topics Concern   Not on file  Social History Narrative   Not on file   Social Drivers of Health   Financial Resource Strain: Low Risk  (05/27/2024)   Overall Financial Resource Strain (CARDIA)    Difficulty of Paying Living Expenses: Not hard at all  Food Insecurity: No Food Insecurity (05/27/2024)   Hunger Vital Sign    Worried About Running Out of Food in the Last Year: Never true    Ran Out of Food in the Last Year: Never true  Transportation Needs: No Transportation Needs (05/27/2024)   PRAPARE - Administrator, Civil Service (Medical): No    Lack of Transportation (Non-Medical): No  Physical Activity: Insufficiently Active (05/27/2024)   Exercise Vital Sign    Days of Exercise per Week: 1 day    Minutes of Exercise per Session: 40 min  Stress: No Stress Concern Present (05/27/2024)   [employer] of Occupational Health - Occupational Stress Questionnaire    Feeling of Stress: Only a  little  Recent Concern: Stress - Stress Concern Present (03/28/2024)   [employer] of Occupational Health - Occupational Stress Questionnaire    Feeling of Stress: To some extent  Social Connections: Moderately Integrated (05/27/2024)   Social Connection and Isolation Panel    Frequency of Communication with Friends and Family: Three times a week    Frequency of Social Gatherings with Friends and Family: Twice a week    Attends Religious Services: 1 to 4 times per year    Active Member of Clubs or Organizations: Yes    Attends Banker Meetings: 1 to 4 times per year    Marital Status: Divorced  Recent Concern: Social Connections - Moderately Isolated (03/28/2024)   Social Connection and Isolation Panel    Frequency of  Communication with Friends and Family: Twice a week    Frequency of Social Gatherings with Friends and Family: Once a week    Attends Religious Services: Never    Database administrator or Organizations: Yes    Attends Engineer, structural: 1 to 4 times per year    Marital Status: Divorced    Family History  Problem Relation Age of Onset   Heart disease Mother    Hypertension Mother    Thyroid disease Mother    Colon polyps Father    Hypertension Father    Congestive Heart Failure Father    Colon cancer Neg Hx    Esophageal cancer Neg Hx    Rectal cancer Neg Hx    Stomach cancer Neg Hx     Health Maintenance  Topic Date Due   Hepatitis B Vaccines 19-59 Average Risk (1 of 3 - 19+ 3-dose series) Never done   Pneumococcal Vaccine: 50+ Years (1 of 1 - PCV) Never done   Zoster Vaccines- Shingrix (1 of 2) Never done   DTaP/Tdap/Td (7 - Td or Tdap) 08/15/2021   Colonoscopy  04/15/2024   COVID-19 Vaccine (3 - 2025-26 season) 06/13/2024 (Originally 04/15/2024)   Influenza Vaccine  11/12/2024 (Originally 03/15/2024)   Hepatitis C Screening  Completed   HIV Screening  Completed   HPV VACCINES  Aged Out   Meningococcal B Vaccine  Aged Out     ----------------------------------------------------------------------------------------------------------------------------------------------------------------------------------------------------------------- Physical Exam BP 139/75 (BP Location: Left Arm, Patient Position: Sitting, Cuff Size: Normal)   Pulse 97   Ht 6' 1 (1.854 m)   Wt 295 lb (133.8 kg)   SpO2 99%   BMI 38.92 kg/m   Physical Exam Constitutional:      Appearance: Normal appearance.  HENT:     Head: Normocephalic and atraumatic.  Eyes:     General: No scleral icterus. Cardiovascular:     Rate and Rhythm: Normal rate and regular rhythm.  Pulmonary:     Effort: Pulmonary effort is normal.     Breath sounds: Normal breath sounds.  Musculoskeletal:     Cervical  back: Neck supple.  Neurological:     Mental Status: He is alert.  Psychiatric:        Mood and Affect: Mood normal.        Behavior: Behavior normal.     ------------------------------------------------------------------------------------------------------------------------------------------------------------------------------------------------------------------- Assessment and Plan  Primary osteoarthritis of both knees He has tried multiple modalities for treatment.  Will continue OTC ibuprofen with tramadol  as needed.  He will let me know when ready for orthopedic consultation.     No orders of the defined types were placed in this encounter.   No follow-ups on file.

## 2024-05-29 ENCOUNTER — Other Ambulatory Visit: Payer: Self-pay | Admitting: Family Medicine

## 2024-05-30 ENCOUNTER — Encounter: Payer: Self-pay | Admitting: Family Medicine

## 2024-05-30 DIAGNOSIS — M1711 Unilateral primary osteoarthritis, right knee: Secondary | ICD-10-CM

## 2024-06-02 NOTE — Assessment & Plan Note (Signed)
 He has tried multiple modalities for treatment.  Will continue OTC ibuprofen with tramadol as needed.  He will let me know when ready for orthopedic consultation.

## 2024-06-07 DIAGNOSIS — M1711 Unilateral primary osteoarthritis, right knee: Secondary | ICD-10-CM | POA: Insufficient documentation

## 2024-07-12 ENCOUNTER — Other Ambulatory Visit: Payer: Self-pay | Admitting: Family Medicine

## 2024-07-12 DIAGNOSIS — E782 Mixed hyperlipidemia: Secondary | ICD-10-CM

## 2024-07-12 DIAGNOSIS — I10 Essential (primary) hypertension: Secondary | ICD-10-CM

## 2024-07-13 ENCOUNTER — Encounter: Payer: Self-pay | Admitting: Gastroenterology

## 2024-08-28 ENCOUNTER — Encounter: Payer: Self-pay | Admitting: Family Medicine

## 2024-09-04 ENCOUNTER — Other Ambulatory Visit: Payer: Self-pay | Admitting: Family Medicine

## 2024-09-04 DIAGNOSIS — M170 Bilateral primary osteoarthritis of knee: Secondary | ICD-10-CM

## 2024-09-11 ENCOUNTER — Encounter: Payer: Self-pay | Admitting: Family Medicine

## 2024-09-11 ENCOUNTER — Ambulatory Visit: Admitting: Family Medicine

## 2024-09-11 VITALS — BP 117/76 | HR 82 | Ht 73.0 in | Wt 289.0 lb

## 2024-09-11 DIAGNOSIS — Z125 Encounter for screening for malignant neoplasm of prostate: Secondary | ICD-10-CM | POA: Diagnosis not present

## 2024-09-11 DIAGNOSIS — Z0000 Encounter for general adult medical examination without abnormal findings: Secondary | ICD-10-CM | POA: Diagnosis not present

## 2024-09-11 DIAGNOSIS — I10 Essential (primary) hypertension: Secondary | ICD-10-CM | POA: Diagnosis not present

## 2024-09-11 DIAGNOSIS — Z1211 Encounter for screening for malignant neoplasm of colon: Secondary | ICD-10-CM

## 2024-09-11 DIAGNOSIS — E782 Mixed hyperlipidemia: Secondary | ICD-10-CM

## 2024-09-11 MED ORDER — ZEPBOUND 5 MG/0.5ML ~~LOC~~ SOAJ: 5.0000 mg | SUBCUTANEOUS | 1 refills | Status: AC

## 2024-09-11 MED ORDER — VORTIOXETINE HBR 10 MG PO TABS: 10.0000 mg | ORAL_TABLET | Freq: Every day | ORAL | 1 refills | Status: AC

## 2024-09-11 MED ORDER — ZEPBOUND 2.5 MG/0.5ML ~~LOC~~ SOAJ: 2.5000 mg | SUBCUTANEOUS | 0 refills | Status: AC

## 2024-09-11 NOTE — Assessment & Plan Note (Signed)
 Well adult Orders Placed This Encounter  Procedures   CMP14+EGFR   CBC with Differential/Platelet   Lipid Panel With LDL/HDL Ratio   PSA   HgB J8r   Ambulatory referral to Gastroenterology    Referral Priority:   Routine    Referral Type:   Consultation    Referral Reason:   Specialty Services Required    Number of Visits Requested:   1  Screenings: Per lab orders Immunizations: Declines Shingrix at this time Anticipatory guidance/risk factor reduction: Recommendations per AVS.

## 2024-09-11 NOTE — Patient Instructions (Signed)

## 2024-09-11 NOTE — Progress Notes (Signed)
 " Albert Hogan - 54 y.o. male MRN [id number]  Date of birth: [birth year]  Subjective No chief complaint on file.   HPI Albert Hogan is a 54 y.o. male here today for annual exam.   He reports that he is doing well at this time.  Would like to discuss medications for weight management.  He is doing pretty well with physical activity but has had some trouble with managing diet.   He smokes occasional cigar and has 1-2 shots of EtOH each week.   He has regular dental care  Review of Systems  Constitutional:  Negative for chills, fever, malaise/fatigue and weight loss.  HENT:  Negative for congestion, ear pain and sore throat.   Eyes:  Negative for blurred vision, double vision and pain.  Respiratory:  Negative for cough and shortness of breath.   Cardiovascular:  Negative for chest pain and palpitations.  Gastrointestinal:  Negative for abdominal pain, blood in stool, constipation, heartburn and nausea.  Genitourinary:  Negative for dysuria and urgency.  Musculoskeletal:  Negative for joint pain and myalgias.  Neurological:  Negative for dizziness and headaches.  Endo/Heme/Allergies:  Does not bruise/bleed easily.  Psychiatric/Behavioral:  Negative for depression. The patient is not nervous/anxious and does not have insomnia.     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 2003   Morbid obesity (HCC) 04/21/2017    Past Surgical History:  Procedure Laterality Date   APPENDECTOMY  2001   CIRCUMCISION     age 27   HERNIA REPAIR  1998   KNEE SURGERY Right 11/27/2015   Partial medial and lateral meniscetomy and microfracture lateral femoral condyle. Dr Lynwood Glatter [facility] [place]    Social History   Socioeconomic History   Marital  status: Single    Spouse name: Not on file   Number of children: Not on file   Years of education: Not on file   Highest education level: Bachelor's degree (e.g., BA, AB, BS)  Occupational History   Not on file  Tobacco Use   Smoking status: Never   Smokeless tobacco: Never   Tobacco comments:    Occ cigar- maybe 3 x a year   Vaping Use   Vaping status: Never Used  Substance and Sexual Activity   Alcohol use: Yes    Comment: Occ- socially   Drug use: No   Sexual activity: Not on file  Other Topics Concern   Not on file  Social History Narrative   Not on file   Social Drivers of Health   Tobacco Use: Low Risk (05/28/2024)   Patient History    Smoking Tobacco Use: Never    Smokeless Tobacco Use: Never    Passive Exposure: Not on file  Financial Resource Strain: Low Risk (05/27/2024)   Overall Financial Resource Strain (CARDIA)    Difficulty of Paying Living Expenses: Not hard at all  Food Insecurity: No Food Insecurity (05/27/2024)   Epic    Worried About Programme Researcher, Broadcasting/film/video in the Last Year: Never true    Ran Out of Food in the Last Year: Never true  Transportation Needs: No Transportation Needs (05/27/2024)   Epic    Lack of Transportation (Medical): No    Lack of  Transportation (Non-Medical): No  Physical Activity: Insufficiently Active (05/27/2024)   Exercise Vital Sign    Days of Exercise per Week: 1 day    Minutes of Exercise per Session: 40 min  Stress: No Stress Concern Present (05/27/2024)   [employer] - Occupational Stress Questionnaire    Feeling of Stress: Only a little  Recent Concern: Stress - Stress Concern Present (03/28/2024)   [employer]ational [employer] - Occupational Stress Questionnaire    Feeling of Stress: To some extent  Social Connections: Moderately Integrated (05/27/2024)   Social Connection and Isolation Panel    Frequency of Communication with Friends and Family: Three times a week    Frequency  of Social Gatherings with Friends and Family: Twice a week    Attends Religious Services: 1 to 4 times per year    Active Member of Clubs or Organizations: Yes    Attends Banker Meetings: 1 to 4 times per year    Marital Status: Divorced  Recent Concern: Social Connections - Moderately Isolated (03/28/2024)   Social Connection and Isolation Panel    Frequency of Communication with Friends and Family: Twice a week    Frequency of Social Gatherings with Friends and Family: Once a week    Attends Religious Services: Never    Database Administrator or Organizations: Yes    Attends Banker Meetings: 1 to 4 times per year    Marital Status: Divorced  Depression (PHQ2-9): Low Risk (05/28/2024)   Depression (PHQ2-9)    PHQ-2 Score: 1  Alcohol Screen: Low Risk (05/27/2024)   Alcohol Screen    Last Alcohol Screening Score (AUDIT): 6  Housing: Low Risk (05/27/2024)   Epic    Unable to Pay for Housing in the Last Year: No    Number of Times Moved in the Last Year: 0    Homeless in the Last Year: No  Utilities: Not on file  Health Literacy: Not on file    Family History  Problem Relation Age of Onset   Heart disease Mother    Hypertension Mother    Thyroid disease Mother    Colon polyps Father    Hypertension Father    Congestive Heart Failure Father    Colon cancer Neg Hx    Esophageal cancer Neg Hx    Rectal cancer Neg Hx    Stomach cancer Neg Hx     Health Maintenance  Topic Date Due   Hepatitis B Vaccines 19-59 Average Risk (1 of 3 - 19+ 3-dose series) Never done   Pneumococcal Vaccine: 50+ Years (1 of 1 - PCV) Never done   Zoster Vaccines- Shingrix (1 of 2) Never done   DTaP/Tdap/Td (7 - Td or Tdap) 08/15/2021   Colonoscopy  04/15/2024   COVID-19 Vaccine (3 - 2025-26 season) 04/15/2024   Influenza Vaccine  11/12/2024 (Originally 03/15/2024)   HPV VACCINES (No Doses Required) Completed   Hepatitis C Screening  Completed   HIV Screening  Completed    Meningococcal B Vaccine  Aged Out     ----------------------------------------------------------------------------------------------------------------------------------------------------------------------------------------------------------------- Physical Exam There were no vitals taken for this visit.  Physical Exam Constitutional:      General: He is not in acute distress. HENT:     Head: Normocephalic and atraumatic.     Right Ear: Tympanic membrane and external ear normal.     Left Ear: Tympanic membrane and external ear normal.  Eyes:     General: No scleral icterus.  Neck:     Thyroid: No thyromegaly.  Cardiovascular:     Rate and Rhythm: Normal rate and regular rhythm.     Heart sounds: Normal heart sounds.  Pulmonary:     Effort: Pulmonary effort is normal.     Breath sounds: Normal breath sounds.  Abdominal:     General: Bowel sounds are normal. There is no distension.     Palpations: Abdomen is soft.     Tenderness: There is no abdominal tenderness. There is no guarding.  Musculoskeletal:     Cervical back: Normal range of motion.  Lymphadenopathy:     Cervical: No cervical adenopathy.  Skin:    General: Skin is warm and dry.     Findings: No rash.  Neurological:     Mental Status: He is alert and oriented to person, place, and time.     Cranial Nerves: No cranial nerve deficit.     Motor: No abnormal muscle tone.  Psychiatric:        Mood and Affect: Mood normal.        Behavior: Behavior normal.     ------------------------------------------------------------------------------------------------------------------------------------------------------------------------------------------------------------------- Assessment and Plan  Well adult exam Well adult Orders Placed This Encounter  Procedures   CMP14+EGFR   CBC with Differential/Platelet   Lipid Panel With LDL/HDL Ratio   PSA   HgB J8r   Ambulatory referral to Gastroenterology    Referral  Priority:   Routine    Referral Type:   Consultation    Referral Reason:   Specialty Services Required    Number of Visits Requested:   1  Screenings: Per lab orders Immunizations: Declines Shingrix at this time Anticipatory guidance/risk factor reduction: Recommendations per AVS.  Morbid obesity (HCC) Discussed GLP-1 medication for weight loss.  Reviewed potential side effects of medications. Will see if Zepbound  if covered.  Discussed dietary and exercise changes as well for most effect from medication.     Meds ordered this encounter  Medications   tirzepatide  (ZEPBOUND ) 2.5 MG/0.5ML Pen    Sig: Inject 2.5 mg into the skin once a week.    Dispense:  2 mL    Refill:  0   tirzepatide  (ZEPBOUND ) 5 MG/0.5ML Pen    Sig: Inject 5 mg into the skin once a week.    Dispense:  2 mL    Refill:  1    No follow-ups on file.        [1]  Allergies Allergen Reactions   Shellfish Allergy Anaphylaxis   "

## 2024-09-11 NOTE — Assessment & Plan Note (Signed)
Discussed GLP-1 medication for weight loss.  Reviewed potential side effects of medications. Will see if Zepbound if covered.  Discussed dietary and exercise changes as well for most effect from medication.

## 2024-09-12 LAB — LIPID PANEL WITH LDL/HDL RATIO
Cholesterol, Total: 142 mg/dL (ref 100–199)
HDL: 48 mg/dL
LDL Chol Calc (NIH): 81 mg/dL (ref 0–99)
LDL/HDL Ratio: 1.7 ratio (ref 0.0–3.6)
Triglycerides: 66 mg/dL (ref 0–149)
VLDL Cholesterol Cal: 13 mg/dL (ref 5–40)

## 2024-09-12 LAB — PSA: Prostate Specific Ag, Serum: 0.6 ng/mL (ref 0.0–4.0)

## 2024-09-12 LAB — HEMOGLOBIN A1C
Est. average glucose Bld gHb Est-mCnc: 126 mg/dL
Hgb A1c MFr Bld: 6 % — ABNORMAL HIGH (ref 4.8–5.6)

## 2024-09-12 LAB — CMP14+EGFR
ALT: 24 [IU]/L (ref 0–44)
AST: 21 [IU]/L (ref 0–40)
Albumin: 4.4 g/dL (ref 3.8–4.9)
Alkaline Phosphatase: 97 [IU]/L (ref 47–123)
BUN/Creatinine Ratio: 16 (ref 9–20)
BUN: 20 mg/dL (ref 6–24)
Bilirubin Total: 0.4 mg/dL (ref 0.0–1.2)
CO2: 25 mmol/L (ref 20–29)
Calcium: 9.4 mg/dL (ref 8.7–10.2)
Chloride: 102 mmol/L (ref 96–106)
Creatinine, Ser: 1.25 mg/dL (ref 0.76–1.27)
Globulin, Total: 2.8 g/dL (ref 1.5–4.5)
Glucose: 103 mg/dL — ABNORMAL HIGH (ref 70–99)
Potassium: 4.1 mmol/L (ref 3.5–5.2)
Sodium: 142 mmol/L (ref 134–144)
Total Protein: 7.2 g/dL (ref 6.0–8.5)
eGFR: 69 mL/min/{1.73_m2}

## 2024-09-12 LAB — CBC WITH DIFFERENTIAL/PLATELET
Basophils Absolute: 0 10*3/uL (ref 0.0–0.2)
Basos: 1 %
EOS (ABSOLUTE): 0.1 10*3/uL (ref 0.0–0.4)
Eos: 2 %
Hematocrit: 44.6 % (ref 37.5–51.0)
Hemoglobin: 14.6 g/dL (ref 13.0–17.7)
Immature Grans (Abs): 0 10*3/uL (ref 0.0–0.1)
Immature Granulocytes: 0 %
Lymphocytes Absolute: 2.4 10*3/uL (ref 0.7–3.1)
Lymphs: 31 %
MCH: 29.3 pg (ref 26.6–33.0)
MCHC: 32.7 g/dL (ref 31.5–35.7)
MCV: 90 fL (ref 79–97)
Monocytes Absolute: 0.5 10*3/uL (ref 0.1–0.9)
Monocytes: 7 %
Neutrophils Absolute: 4.6 10*3/uL (ref 1.4–7.0)
Neutrophils: 59 %
Platelets: 215 10*3/uL (ref 150–450)
RBC: 4.98 x10E6/uL (ref 4.14–5.80)
RDW: 13.3 % (ref 11.6–15.4)
WBC: 7.7 10*3/uL (ref 3.4–10.8)

## 2024-09-20 ENCOUNTER — Ambulatory Visit: Payer: Self-pay | Admitting: Family Medicine
